# Patient Record
Sex: Male | Born: 1950
Health system: Southern US, Community
[De-identification: ages and names within clinical notes are randomized; demographics above are authoritative.]

## PROBLEM LIST (undated history)

## (undated) DIAGNOSIS — I480 Paroxysmal atrial fibrillation: Secondary | ICD-10-CM

## (undated) DIAGNOSIS — J439 Emphysema, unspecified: Secondary | ICD-10-CM

## (undated) DIAGNOSIS — J449 Chronic obstructive pulmonary disease, unspecified: Secondary | ICD-10-CM

## (undated) DIAGNOSIS — N189 Chronic kidney disease, unspecified: Secondary | ICD-10-CM

## (undated) DIAGNOSIS — F32A Depression, unspecified: Secondary | ICD-10-CM

## (undated) DIAGNOSIS — F419 Anxiety disorder, unspecified: Secondary | ICD-10-CM

## (undated) DIAGNOSIS — M1990 Unspecified osteoarthritis, unspecified site: Secondary | ICD-10-CM

## (undated) DIAGNOSIS — K219 Gastro-esophageal reflux disease without esophagitis: Secondary | ICD-10-CM

## (undated) DIAGNOSIS — I499 Cardiac arrhythmia, unspecified: Secondary | ICD-10-CM

## (undated) DIAGNOSIS — F329 Major depressive disorder, single episode, unspecified: Secondary | ICD-10-CM

## (undated) DIAGNOSIS — R0602 Shortness of breath: Secondary | ICD-10-CM

## (undated) DIAGNOSIS — I10 Essential (primary) hypertension: Secondary | ICD-10-CM

## (undated) HISTORY — DX: Chronic kidney disease, unspecified: N18.9

## (undated) HISTORY — DX: Paroxysmal atrial fibrillation: I48.0

## (undated) HISTORY — PX: APPENDECTOMY: SHX54

## (undated) HISTORY — DX: Emphysema, unspecified: J43.9

## (undated) HISTORY — PX: EYE SURGERY: SHX253

---

## 2002-10-30 ENCOUNTER — Encounter: Payer: Self-pay | Admitting: Family Medicine

## 2002-10-30 ENCOUNTER — Ambulatory Visit (HOSPITAL_COMMUNITY): Admission: RE | Admit: 2002-10-30 | Discharge: 2002-10-30 | Payer: Self-pay | Admitting: Family Medicine

## 2002-11-14 ENCOUNTER — Ambulatory Visit (HOSPITAL_BASED_OUTPATIENT_CLINIC_OR_DEPARTMENT_OTHER): Admission: RE | Admit: 2002-11-14 | Discharge: 2002-11-14 | Payer: Self-pay | Admitting: Family Medicine

## 2011-10-05 ENCOUNTER — Other Ambulatory Visit: Payer: Self-pay | Admitting: Gastroenterology

## 2011-10-05 DIAGNOSIS — R140 Abdominal distension (gaseous): Secondary | ICD-10-CM

## 2011-10-06 ENCOUNTER — Ambulatory Visit
Admission: RE | Admit: 2011-10-06 | Discharge: 2011-10-06 | Disposition: A | Discharge status: Home / Self Care | Payer: PRIVATE HEALTH INSURANCE | Source: Ambulatory Visit | Attending: Gastroenterology | Admitting: Gastroenterology

## 2011-10-06 DIAGNOSIS — R140 Abdominal distension (gaseous): Secondary | ICD-10-CM

## 2011-10-06 MED ORDER — IOHEXOL 300 MG/ML  SOLN
125.0000 mL | Freq: Once | INTRAMUSCULAR | Status: AC | PRN
  Administered 2011-10-06: 125 mL via INTRAVENOUS

## 2011-10-21 ENCOUNTER — Ambulatory Visit (HOSPITAL_COMMUNITY): Payer: PRIVATE HEALTH INSURANCE | Admitting: Anesthesiology

## 2011-10-21 ENCOUNTER — Encounter (HOSPITAL_COMMUNITY)
Admission: RE | Disposition: A | Discharge status: Home / Self Care | Payer: Self-pay | Source: Ambulatory Visit | Attending: Gastroenterology

## 2011-10-21 ENCOUNTER — Encounter (HOSPITAL_COMMUNITY): Payer: Self-pay | Admitting: *Deleted

## 2011-10-21 ENCOUNTER — Encounter (HOSPITAL_COMMUNITY): Payer: Self-pay | Admitting: Anesthesiology

## 2011-10-21 ENCOUNTER — Ambulatory Visit (HOSPITAL_COMMUNITY)
Admission: RE | Admit: 2011-10-21 | Discharge: 2011-10-21 | Disposition: A | Discharge status: Home / Self Care | Payer: PRIVATE HEALTH INSURANCE | Source: Ambulatory Visit | Attending: Gastroenterology | Admitting: Gastroenterology

## 2011-10-21 DIAGNOSIS — I10 Essential (primary) hypertension: Secondary | ICD-10-CM | POA: Insufficient documentation

## 2011-10-21 DIAGNOSIS — R112 Nausea with vomiting, unspecified: Secondary | ICD-10-CM | POA: Insufficient documentation

## 2011-10-21 DIAGNOSIS — R1033 Periumbilical pain: Secondary | ICD-10-CM | POA: Insufficient documentation

## 2011-10-21 DIAGNOSIS — R109 Unspecified abdominal pain: Secondary | ICD-10-CM | POA: Insufficient documentation

## 2011-10-21 DIAGNOSIS — K219 Gastro-esophageal reflux disease without esophagitis: Secondary | ICD-10-CM | POA: Insufficient documentation

## 2011-10-21 DIAGNOSIS — K449 Diaphragmatic hernia without obstruction or gangrene: Secondary | ICD-10-CM | POA: Insufficient documentation

## 2011-10-21 DIAGNOSIS — J4489 Other specified chronic obstructive pulmonary disease: Secondary | ICD-10-CM | POA: Insufficient documentation

## 2011-10-21 DIAGNOSIS — J449 Chronic obstructive pulmonary disease, unspecified: Secondary | ICD-10-CM | POA: Insufficient documentation

## 2011-10-21 DIAGNOSIS — D126 Benign neoplasm of colon, unspecified: Secondary | ICD-10-CM | POA: Insufficient documentation

## 2011-10-21 HISTORY — DX: Gastro-esophageal reflux disease without esophagitis: K21.9

## 2011-10-21 HISTORY — DX: Depression, unspecified: F32.A

## 2011-10-21 HISTORY — DX: Unspecified osteoarthritis, unspecified site: M19.90

## 2011-10-21 HISTORY — DX: Anxiety disorder, unspecified: F41.9

## 2011-10-21 HISTORY — DX: Major depressive disorder, single episode, unspecified: F32.9

## 2011-10-21 HISTORY — DX: Essential (primary) hypertension: I10

## 2011-10-21 HISTORY — DX: Shortness of breath: R06.02

## 2011-10-21 HISTORY — DX: Chronic obstructive pulmonary disease, unspecified: J44.9

## 2011-10-21 SURGERY — ESOPHAGOGASTRODUODENOSCOPY (EGD) WITH PROPOFOL
Anesthesia: Monitor Anesthesia Care

## 2011-10-21 MED ORDER — MEPERIDINE HCL 100 MG/ML IJ SOLN: 6.2500 mg | INTRAMUSCULAR | Status: DC | PRN

## 2011-10-21 MED ORDER — MIDAZOLAM HCL 5 MG/5ML IJ SOLN
INTRAMUSCULAR | Status: DC | PRN
  Administered 2011-10-21: 2 mg via INTRAVENOUS

## 2011-10-21 MED ORDER — PROMETHAZINE HCL 25 MG/ML IJ SOLN: 6.2500 mg | INTRAMUSCULAR | Status: DC | PRN

## 2011-10-21 MED ORDER — PROPOFOL 10 MG/ML IV EMUL
INTRAVENOUS | Status: DC | PRN
  Administered 2011-10-21: 70 ug/kg/min via INTRAVENOUS

## 2011-10-21 MED ORDER — SODIUM CHLORIDE 0.9 % IV SOLN: INTRAVENOUS | Status: DC

## 2011-10-21 MED ORDER — KETAMINE HCL 50 MG/ML IJ SOLN
INTRAMUSCULAR | Status: DC | PRN
  Administered 2011-10-21: 50 mg via INTRAMUSCULAR

## 2011-10-21 MED ORDER — LACTATED RINGERS IV SOLN
INTRAVENOUS | Status: DC
  Administered 2011-10-21: 11:00:00 via INTRAVENOUS

## 2011-10-21 MED ORDER — BUTAMBEN-TETRACAINE-BENZOCAINE 2-2-14 % EX AERO
INHALATION_SPRAY | CUTANEOUS | Status: DC | PRN
  Administered 2011-10-21: 2 via TOPICAL

## 2011-10-21 MED ORDER — FENTANYL CITRATE 0.05 MG/ML IJ SOLN
INTRAMUSCULAR | Status: DC | PRN
  Administered 2011-10-21: 100 ug via INTRAVENOUS

## 2011-10-21 SURGICAL SUPPLY — 25 items

## 2011-10-21 NOTE — Anesthesia Postprocedure Evaluation (Signed)
  Anesthesia Post-op Note  [name]  Procedure(s) Performed: Procedure(s) (LRB): ESOPHAGOGASTRODUODENOSCOPY (EGD) WITH PROPOFOL (N/A) COLONOSCOPY WITH PROPOFOL (N/A)  Patient Location: PACU  Anesthesia Type: MAC  Level of Consciousness: awake and alert   Airway and Oxygen Therapy: Patient Spontanous Breathing  Post-op Pain: mild  Post-op Assessment: Post-op Vital signs reviewed, Patient's Cardiovascular Status Stable, Respiratory Function Stable, Patent Airway and No signs of Nausea or vomiting  Post-op Vital Signs: stable  Complications: No apparent anesthesia complications

## 2011-10-21 NOTE — Op Note (Signed)
[facility] [address] [place], [address]   ENDOSCOPY PROCEDURE REPORT  PATIENT: Juanye, Ziobro  MR#: [id number] BIRTHDATE: 16-Mar-1950 , 61  yrs. old GENDER: Male ENDOSCOPIST: Willis Modena, MD REFERRED BY: PROCEDURE DATE:  10/21/2011 PROCEDURE:  EGD, diagnostic ASA CLASS:     Class III INDICATIONS:  periumbilical abdominal pain.   nausea and vomiting. MEDICATIONS: MAC sedation, administered by CRNA TOPICAL ANESTHETIC: Cetacaine Spray  DESCRIPTION OF PROCEDURE: After the risks benefits and alternatives of the procedure were thoroughly explained, informed consent was obtained.  The Pentax Gastroscope [id number] endoscope was introduced through the mouth and advanced to the second portion of the duodenum. Without limitations.  The instrument was slowly withdrawn as the mucosa was fully examined.    Findings:  Moderate hiatal hernia; otherwise normal esophagus. Normal stomach, pylorus and duodenum to the second portion. Retroflexion into cardia showed hiatal hernia, otherwise normal.          The scope was then withdrawn from the patient and the procedure completed.  ENDOSCOPIC IMPRESSION:     As above.  Hiatal hernia could predispose to GERD which in turn could cause some nausea and vomiting symptoms.  RECOMMENDATIONS:     1.  Watch for potential complications of procedure. 2.  Continue Nexium 40 mg bid. 3.  Consider abdominal ultrasound to better exclude gallbladder source of symptoms. 4.  Proceed with colonoscopy today.  eSigned:  Willis Modena, MD 10/21/2011 12:18 PM   CC:

## 2011-10-21 NOTE — Transfer of Care (Signed)
Immediate Anesthesia Transfer of Care Note  [name] [facility]  Procedure(s) Performed: Procedure(s) (LRB) with comments: ESOPHAGOGASTRODUODENOSCOPY (EGD) WITH PROPOFOL (N/A) COLONOSCOPY WITH PROPOFOL (N/A)  Patient Location: PACU  Anesthesia Type: MAC  Level of Consciousness: sedated  Airway & Oxygen Therapy: Patient Spontanous Breathing and Patient connected to nasal cannula oxygen  Post-op Assessment: Report given to PACU RN and Post -op Vital signs reviewed and stable  Post vital signs: Reviewed and stable  Complications: No apparent anesthesia complications

## 2011-10-21 NOTE — H&P (Signed)
Patient interval history reviewed.  Patient examined again.  There has been no change from documented H/P dated 10/05/11 (scanned into chart from our office) except as documented above.  Assessment:  1.  Generalized abdominal pain. 2.  Nausea and vomiting.  Plan:  1.  Upper endoscopy and colonoscopy. 2.  Risks (bleeding, infection, bowel perforation that could require surgery, sedation-related changes in cardiopulmonary systems), benefits (identification and possible treatment of source of symptoms, exclusion of certain causes of symptoms), and alternatives (watchful waiting, radiographic imaging studies, empiric medical treatment) of upper endoscopy (EGD) were explained to patient in detail and patient wishes to proceed. 3.  Risks (bleeding, infection, bowel perforation that could require surgery, sedation-related changes in cardiopulmonary systems), benefits (identification and possible treatment of source of symptoms, exclusion of certain causes of symptoms), and alternatives (watchful waiting, radiographic imaging studies, empiric medical treatment) of colonoscopy were explained to patient in detail and patient wishes to proceed.

## 2011-10-21 NOTE — Op Note (Signed)
[facility] [address] [place], [address]   COLONOSCOPY PROCEDURE REPORT  PATIENT: Jay, [name]  MR#: [id number] BIRTHDATE: Jan 26, 1950 , 61  yrs. old GENDER: Male ENDOSCOPIST: Willis Modena, MD REFERRED BY: Elvera Lennox, PA-C PROCEDURE DATE:  10/21/2011 PROCEDURE:   Colonoscopy with snare polypectomy ASA CLASS:   Class III INDICATIONS:abdominal pain and average risk patient for colon cancer. MEDICATIONS: MAC sedation, administered by CRNA  DESCRIPTION OF PROCEDURE:   After the risks benefits and alternatives of the procedure were thoroughly explained, informed consent was obtained.  A digital rectal exam revealed no abnormalities of the rectum.   The Pentax Ped Colon [id number] endoscope was introduced through the anus and advanced to the cecum, which was identified by both the appendix and ileocecal valve. No adverse events experienced.   The quality of the prep was good.  The instrument was then slowly withdrawn as the colon was fully examined.    Findings:  Digital rectal exam normal.  Prep quality was good. Multiple left-sided diverticula seen.  Three sub-cm sigmoid polyps removed wtih snare cautyer.  Stool plug seen in residual appendiceal stump.  No other polyps, masses, vascular ectasias, or inflammatory changes were seen.       Retroflexed view of rectum was normal.       Withdrawal time was over 10 minutes     .  The scope was withdrawn and the procedure completed.  ENDOSCOPIC IMPRESSION:     As above.  No cause of abdominal pain identified.  RECOMMENDATIONS:     1.  Watch for potential complications of procedure. 2.  Await polypectomy results, which will determine interval for next surveillance colonoscopy. 3.  Follow-up with [facility] in 4-6 weeks.  eSigned:  Willis Modena, MD 10/21/2011 12:23 PM   cc:

## 2011-10-21 NOTE — Anesthesia Preprocedure Evaluation (Signed)
Anesthesia Evaluation  Patient identified by MRN, date of birth, ID band Patient awake    Reviewed: Allergy & Precautions, H&P , NPO status , Patient's Chart, lab work & pertinent test results  Airway Mallampati: I TM Distance: >3 FB Neck ROM: Full    Dental No notable dental hx. (+) Edentulous Upper and Edentulous Lower   Pulmonary neg pulmonary ROS, shortness of breath, COPD COPD inhaler, former smoker,  breath sounds clear to auscultation  Pulmonary exam normal       Cardiovascular hypertension, Pt. on medications negative cardio ROS  Rhythm:Regular Rate:Normal     Neuro/Psych PSYCHIATRIC DISORDERS Anxiety Depression negative neurological ROS  negative psych ROS   GI/Hepatic negative GI ROS, Neg liver ROS, GERD-  ,  Endo/Other  negative endocrine ROS  Renal/GU negative Renal ROS  negative genitourinary   Musculoskeletal negative musculoskeletal ROS (+)   Abdominal   Peds negative pediatric ROS (+)  Hematology negative hematology ROS (+)   Anesthesia Other Findings   Reproductive/Obstetrics negative OB ROS                           Anesthesia Physical Anesthesia Plan  ASA: III  Anesthesia Plan: MAC   Post-op Pain Management:    Induction:   Airway Management Planned: Nasal Cannula and Simple Face Mask  Additional Equipment:   Intra-op Plan:   Post-operative Plan:   Informed Consent: I have reviewed the patients History and Physical, chart, labs and discussed the procedure including the risks, benefits and alternatives for the proposed anesthesia with the patient or authorized representative who has indicated his/her understanding and acceptance.   Dental advisory given  Plan Discussed with: CRNA  Anesthesia Plan Comments:         Anesthesia Quick Evaluation

## 2012-04-13 ENCOUNTER — Institutional Professional Consult (permissible substitution): Payer: PRIVATE HEALTH INSURANCE | Admitting: Internal Medicine

## 2014-01-15 DIAGNOSIS — R5383 Other fatigue: Secondary | ICD-10-CM | POA: Diagnosis not present

## 2014-01-15 DIAGNOSIS — E785 Hyperlipidemia, unspecified: Secondary | ICD-10-CM | POA: Diagnosis not present

## 2014-01-15 DIAGNOSIS — F329 Major depressive disorder, single episode, unspecified: Secondary | ICD-10-CM | POA: Diagnosis not present

## 2014-01-15 DIAGNOSIS — I10 Essential (primary) hypertension: Secondary | ICD-10-CM | POA: Diagnosis not present

## 2014-01-15 DIAGNOSIS — K219 Gastro-esophageal reflux disease without esophagitis: Secondary | ICD-10-CM | POA: Diagnosis not present

## 2014-01-15 DIAGNOSIS — Z23 Encounter for immunization: Secondary | ICD-10-CM | POA: Diagnosis not present

## 2014-01-15 DIAGNOSIS — F419 Anxiety disorder, unspecified: Secondary | ICD-10-CM | POA: Diagnosis not present

## 2014-01-15 DIAGNOSIS — J449 Chronic obstructive pulmonary disease, unspecified: Secondary | ICD-10-CM | POA: Diagnosis not present

## 2014-02-15 DIAGNOSIS — E785 Hyperlipidemia, unspecified: Secondary | ICD-10-CM | POA: Diagnosis not present

## 2014-07-23 DIAGNOSIS — F419 Anxiety disorder, unspecified: Secondary | ICD-10-CM | POA: Diagnosis not present

## 2014-07-23 DIAGNOSIS — I10 Essential (primary) hypertension: Secondary | ICD-10-CM | POA: Diagnosis not present

## 2014-07-23 DIAGNOSIS — J449 Chronic obstructive pulmonary disease, unspecified: Secondary | ICD-10-CM | POA: Diagnosis not present

## 2014-07-23 DIAGNOSIS — K219 Gastro-esophageal reflux disease without esophagitis: Secondary | ICD-10-CM | POA: Diagnosis not present

## 2014-07-23 DIAGNOSIS — E785 Hyperlipidemia, unspecified: Secondary | ICD-10-CM | POA: Diagnosis not present

## 2014-07-23 DIAGNOSIS — F329 Major depressive disorder, single episode, unspecified: Secondary | ICD-10-CM | POA: Diagnosis not present

## 2015-02-26 DIAGNOSIS — M5136 Other intervertebral disc degeneration, lumbar region: Secondary | ICD-10-CM | POA: Diagnosis not present

## 2015-02-26 DIAGNOSIS — M5090 Cervical disc disorder, unspecified, unspecified cervical region: Secondary | ICD-10-CM | POA: Diagnosis not present

## 2015-03-18 DIAGNOSIS — F39 Unspecified mood [affective] disorder: Secondary | ICD-10-CM | POA: Diagnosis not present

## 2015-03-18 DIAGNOSIS — Z23 Encounter for immunization: Secondary | ICD-10-CM | POA: Diagnosis not present

## 2015-03-18 DIAGNOSIS — E7800 Pure hypercholesterolemia, unspecified: Secondary | ICD-10-CM | POA: Diagnosis not present

## 2015-03-18 DIAGNOSIS — F324 Major depressive disorder, single episode, in partial remission: Secondary | ICD-10-CM | POA: Diagnosis not present

## 2015-03-18 DIAGNOSIS — Z0000 Encounter for general adult medical examination without abnormal findings: Secondary | ICD-10-CM | POA: Diagnosis not present

## 2015-03-18 DIAGNOSIS — F419 Anxiety disorder, unspecified: Secondary | ICD-10-CM | POA: Diagnosis not present

## 2015-03-18 DIAGNOSIS — K219 Gastro-esophageal reflux disease without esophagitis: Secondary | ICD-10-CM | POA: Diagnosis not present

## 2015-03-18 DIAGNOSIS — I10 Essential (primary) hypertension: Secondary | ICD-10-CM | POA: Diagnosis not present

## 2015-03-18 DIAGNOSIS — J449 Chronic obstructive pulmonary disease, unspecified: Secondary | ICD-10-CM | POA: Diagnosis not present

## 2015-03-18 DIAGNOSIS — R5383 Other fatigue: Secondary | ICD-10-CM | POA: Diagnosis not present

## 2015-03-27 DIAGNOSIS — G4710 Hypersomnia, unspecified: Secondary | ICD-10-CM | POA: Diagnosis not present

## 2015-04-11 DIAGNOSIS — H5210 Myopia, unspecified eye: Secondary | ICD-10-CM | POA: Diagnosis not present

## 2015-04-11 DIAGNOSIS — Z0100 Encounter for examination of eyes and vision without abnormal findings: Secondary | ICD-10-CM | POA: Diagnosis not present

## 2015-04-29 DIAGNOSIS — E7800 Pure hypercholesterolemia, unspecified: Secondary | ICD-10-CM | POA: Diagnosis not present

## 2015-04-29 DIAGNOSIS — F3181 Bipolar II disorder: Secondary | ICD-10-CM | POA: Diagnosis not present

## 2015-06-11 ENCOUNTER — Other Ambulatory Visit: Payer: Self-pay | Admitting: Family Medicine

## 2015-06-11 ENCOUNTER — Ambulatory Visit
Admission: RE | Admit: 2015-06-11 | Discharge: 2015-06-11 | Disposition: A | Discharge status: Home / Self Care | Payer: Commercial Managed Care - HMO | Source: Ambulatory Visit | Attending: Family Medicine | Admitting: Family Medicine

## 2015-06-11 DIAGNOSIS — M545 Low back pain: Secondary | ICD-10-CM

## 2015-06-11 DIAGNOSIS — F3181 Bipolar II disorder: Secondary | ICD-10-CM | POA: Diagnosis not present

## 2015-06-11 DIAGNOSIS — M542 Cervicalgia: Secondary | ICD-10-CM

## 2015-06-11 DIAGNOSIS — M47812 Spondylosis without myelopathy or radiculopathy, cervical region: Secondary | ICD-10-CM | POA: Diagnosis not present

## 2015-06-11 DIAGNOSIS — J309 Allergic rhinitis, unspecified: Secondary | ICD-10-CM | POA: Diagnosis not present

## 2015-06-11 DIAGNOSIS — F329 Major depressive disorder, single episode, unspecified: Secondary | ICD-10-CM | POA: Diagnosis not present

## 2015-07-23 DIAGNOSIS — F3181 Bipolar II disorder: Secondary | ICD-10-CM | POA: Diagnosis not present

## 2015-07-23 DIAGNOSIS — M545 Low back pain: Secondary | ICD-10-CM | POA: Diagnosis not present

## 2015-07-24 ENCOUNTER — Other Ambulatory Visit: Payer: Self-pay | Admitting: Family Medicine

## 2015-07-24 DIAGNOSIS — M545 Low back pain: Secondary | ICD-10-CM

## 2015-07-25 ENCOUNTER — Other Ambulatory Visit: Payer: Self-pay | Admitting: Family Medicine

## 2015-07-25 DIAGNOSIS — M545 Low back pain: Secondary | ICD-10-CM

## 2015-08-05 ENCOUNTER — Ambulatory Visit
Admission: RE | Admit: 2015-08-05 | Discharge: 2015-08-05 | Disposition: A | Discharge status: Home / Self Care | Payer: Commercial Managed Care - HMO | Source: Ambulatory Visit | Attending: Family Medicine | Admitting: Family Medicine

## 2015-08-05 DIAGNOSIS — M545 Low back pain: Secondary | ICD-10-CM

## 2015-08-05 DIAGNOSIS — Z01818 Encounter for other preprocedural examination: Secondary | ICD-10-CM | POA: Diagnosis not present

## 2015-10-23 DIAGNOSIS — M545 Low back pain: Secondary | ICD-10-CM | POA: Diagnosis not present

## 2015-10-23 DIAGNOSIS — Z23 Encounter for immunization: Secondary | ICD-10-CM | POA: Diagnosis not present

## 2015-10-23 DIAGNOSIS — F3181 Bipolar II disorder: Secondary | ICD-10-CM | POA: Diagnosis not present

## 2015-10-23 DIAGNOSIS — R413 Other amnesia: Secondary | ICD-10-CM | POA: Diagnosis not present

## 2015-10-30 DIAGNOSIS — I10 Essential (primary) hypertension: Secondary | ICD-10-CM | POA: Diagnosis not present

## 2015-10-30 DIAGNOSIS — M542 Cervicalgia: Secondary | ICD-10-CM | POA: Diagnosis not present

## 2015-10-30 DIAGNOSIS — M545 Low back pain: Secondary | ICD-10-CM | POA: Diagnosis not present

## 2015-10-30 DIAGNOSIS — M62559 Muscle wasting and atrophy, not elsewhere classified, unspecified thigh: Secondary | ICD-10-CM | POA: Diagnosis not present

## 2015-11-12 DIAGNOSIS — M542 Cervicalgia: Secondary | ICD-10-CM | POA: Diagnosis not present

## 2015-11-12 DIAGNOSIS — M62559 Muscle wasting and atrophy, not elsewhere classified, unspecified thigh: Secondary | ICD-10-CM | POA: Diagnosis not present

## 2015-11-12 DIAGNOSIS — M545 Low back pain: Secondary | ICD-10-CM | POA: Diagnosis not present

## 2015-11-12 DIAGNOSIS — I10 Essential (primary) hypertension: Secondary | ICD-10-CM | POA: Diagnosis not present

## 2015-11-15 DIAGNOSIS — M542 Cervicalgia: Secondary | ICD-10-CM | POA: Diagnosis not present

## 2015-11-15 DIAGNOSIS — M62559 Muscle wasting and atrophy, not elsewhere classified, unspecified thigh: Secondary | ICD-10-CM | POA: Diagnosis not present

## 2015-11-15 DIAGNOSIS — M545 Low back pain: Secondary | ICD-10-CM | POA: Diagnosis not present

## 2015-11-15 DIAGNOSIS — I10 Essential (primary) hypertension: Secondary | ICD-10-CM | POA: Diagnosis not present

## 2015-11-18 DIAGNOSIS — I10 Essential (primary) hypertension: Secondary | ICD-10-CM | POA: Diagnosis not present

## 2015-11-18 DIAGNOSIS — M545 Low back pain: Secondary | ICD-10-CM | POA: Diagnosis not present

## 2015-11-18 DIAGNOSIS — M62559 Muscle wasting and atrophy, not elsewhere classified, unspecified thigh: Secondary | ICD-10-CM | POA: Diagnosis not present

## 2015-11-18 DIAGNOSIS — M542 Cervicalgia: Secondary | ICD-10-CM | POA: Diagnosis not present

## 2015-11-20 DIAGNOSIS — I10 Essential (primary) hypertension: Secondary | ICD-10-CM | POA: Diagnosis not present

## 2015-11-20 DIAGNOSIS — M62559 Muscle wasting and atrophy, not elsewhere classified, unspecified thigh: Secondary | ICD-10-CM | POA: Diagnosis not present

## 2015-11-20 DIAGNOSIS — M542 Cervicalgia: Secondary | ICD-10-CM | POA: Diagnosis not present

## 2015-11-20 DIAGNOSIS — M545 Low back pain: Secondary | ICD-10-CM | POA: Diagnosis not present

## 2016-01-22 DIAGNOSIS — F3181 Bipolar II disorder: Secondary | ICD-10-CM | POA: Diagnosis not present

## 2016-01-22 DIAGNOSIS — R413 Other amnesia: Secondary | ICD-10-CM | POA: Diagnosis not present

## 2016-01-24 ENCOUNTER — Other Ambulatory Visit: Payer: Self-pay | Admitting: Family Medicine

## 2016-01-24 DIAGNOSIS — F3181 Bipolar II disorder: Secondary | ICD-10-CM

## 2016-01-27 ENCOUNTER — Other Ambulatory Visit: Payer: Self-pay | Admitting: Family Medicine

## 2016-01-27 DIAGNOSIS — R413 Other amnesia: Secondary | ICD-10-CM

## 2016-01-27 DIAGNOSIS — F3181 Bipolar II disorder: Secondary | ICD-10-CM

## 2016-01-27 DIAGNOSIS — R454 Irritability and anger: Secondary | ICD-10-CM

## 2016-02-01 ENCOUNTER — Other Ambulatory Visit: Payer: Commercial Managed Care - HMO

## 2016-02-03 ENCOUNTER — Other Ambulatory Visit: Payer: Self-pay | Admitting: Family Medicine

## 2016-02-03 DIAGNOSIS — R454 Irritability and anger: Secondary | ICD-10-CM

## 2016-02-03 DIAGNOSIS — F3181 Bipolar II disorder: Secondary | ICD-10-CM

## 2016-02-06 ENCOUNTER — Ambulatory Visit
Admission: RE | Admit: 2016-02-06 | Discharge: 2016-02-06 | Disposition: A | Discharge status: Home / Self Care | Payer: Commercial Managed Care - HMO | Source: Ambulatory Visit | Attending: Family Medicine | Admitting: Family Medicine

## 2016-02-06 DIAGNOSIS — R930 Abnormal findings on diagnostic imaging of skull and head, not elsewhere classified: Secondary | ICD-10-CM | POA: Diagnosis not present

## 2016-02-06 DIAGNOSIS — F3181 Bipolar II disorder: Secondary | ICD-10-CM

## 2016-02-06 DIAGNOSIS — R454 Irritability and anger: Secondary | ICD-10-CM

## 2016-02-06 MED ORDER — IOPAMIDOL (ISOVUE-300) INJECTION 61%
75.0000 mL | Freq: Once | INTRAVENOUS | Status: AC | PRN
  Administered 2016-02-06: 75 mL via INTRAVENOUS

## 2016-02-20 ENCOUNTER — Other Ambulatory Visit (HOSPITAL_COMMUNITY): Payer: Self-pay | Admitting: Family Medicine

## 2016-02-20 DIAGNOSIS — G319 Degenerative disease of nervous system, unspecified: Secondary | ICD-10-CM

## 2016-02-20 DIAGNOSIS — R413 Other amnesia: Secondary | ICD-10-CM

## 2016-02-20 DIAGNOSIS — R454 Irritability and anger: Secondary | ICD-10-CM

## 2016-03-02 ENCOUNTER — Ambulatory Visit (HOSPITAL_COMMUNITY): Payer: Commercial Managed Care - HMO

## 2016-03-02 ENCOUNTER — Encounter (HOSPITAL_COMMUNITY): Payer: Self-pay

## 2016-03-11 DIAGNOSIS — F3181 Bipolar II disorder: Secondary | ICD-10-CM | POA: Diagnosis not present

## 2016-04-30 DIAGNOSIS — H5440 Blindness, one eye, unspecified eye: Secondary | ICD-10-CM | POA: Diagnosis not present

## 2016-04-30 DIAGNOSIS — J449 Chronic obstructive pulmonary disease, unspecified: Secondary | ICD-10-CM | POA: Diagnosis not present

## 2016-04-30 DIAGNOSIS — E7800 Pure hypercholesterolemia, unspecified: Secondary | ICD-10-CM | POA: Diagnosis not present

## 2016-04-30 DIAGNOSIS — Z0000 Encounter for general adult medical examination without abnormal findings: Secondary | ICD-10-CM | POA: Diagnosis not present

## 2016-04-30 DIAGNOSIS — D692 Other nonthrombocytopenic purpura: Secondary | ICD-10-CM | POA: Diagnosis not present

## 2016-04-30 DIAGNOSIS — I10 Essential (primary) hypertension: Secondary | ICD-10-CM | POA: Diagnosis not present

## 2016-04-30 DIAGNOSIS — K219 Gastro-esophageal reflux disease without esophagitis: Secondary | ICD-10-CM | POA: Diagnosis not present

## 2016-04-30 DIAGNOSIS — Z125 Encounter for screening for malignant neoplasm of prostate: Secondary | ICD-10-CM | POA: Diagnosis not present

## 2016-04-30 DIAGNOSIS — Z1159 Encounter for screening for other viral diseases: Secondary | ICD-10-CM | POA: Diagnosis not present

## 2016-04-30 DIAGNOSIS — M545 Low back pain: Secondary | ICD-10-CM | POA: Diagnosis not present

## 2016-06-12 DIAGNOSIS — J449 Chronic obstructive pulmonary disease, unspecified: Secondary | ICD-10-CM | POA: Diagnosis not present

## 2016-06-12 DIAGNOSIS — J209 Acute bronchitis, unspecified: Secondary | ICD-10-CM | POA: Diagnosis not present

## 2016-07-27 DIAGNOSIS — M79651 Pain in right thigh: Secondary | ICD-10-CM | POA: Diagnosis not present

## 2016-08-11 ENCOUNTER — Ambulatory Visit
Admission: RE | Admit: 2016-08-11 | Discharge: 2016-08-11 | Disposition: A | Discharge status: Home / Self Care | Payer: Commercial Managed Care - HMO | Source: Ambulatory Visit | Attending: Physician Assistant | Admitting: Physician Assistant

## 2016-08-11 ENCOUNTER — Other Ambulatory Visit: Payer: Self-pay | Admitting: Physician Assistant

## 2016-08-11 DIAGNOSIS — M25551 Pain in right hip: Secondary | ICD-10-CM

## 2016-08-14 DIAGNOSIS — M9905 Segmental and somatic dysfunction of pelvic region: Secondary | ICD-10-CM | POA: Diagnosis not present

## 2016-08-14 DIAGNOSIS — M25551 Pain in right hip: Secondary | ICD-10-CM | POA: Diagnosis not present

## 2016-08-14 DIAGNOSIS — M9903 Segmental and somatic dysfunction of lumbar region: Secondary | ICD-10-CM | POA: Diagnosis not present

## 2016-08-14 DIAGNOSIS — M5441 Lumbago with sciatica, right side: Secondary | ICD-10-CM | POA: Diagnosis not present

## 2016-08-17 DIAGNOSIS — M9903 Segmental and somatic dysfunction of lumbar region: Secondary | ICD-10-CM | POA: Diagnosis not present

## 2016-08-17 DIAGNOSIS — M25551 Pain in right hip: Secondary | ICD-10-CM | POA: Diagnosis not present

## 2016-08-17 DIAGNOSIS — M5441 Lumbago with sciatica, right side: Secondary | ICD-10-CM | POA: Diagnosis not present

## 2016-08-17 DIAGNOSIS — M9905 Segmental and somatic dysfunction of pelvic region: Secondary | ICD-10-CM | POA: Diagnosis not present

## 2016-08-19 DIAGNOSIS — M9905 Segmental and somatic dysfunction of pelvic region: Secondary | ICD-10-CM | POA: Diagnosis not present

## 2016-08-19 DIAGNOSIS — M5441 Lumbago with sciatica, right side: Secondary | ICD-10-CM | POA: Diagnosis not present

## 2016-08-19 DIAGNOSIS — M9903 Segmental and somatic dysfunction of lumbar region: Secondary | ICD-10-CM | POA: Diagnosis not present

## 2016-08-19 DIAGNOSIS — M25551 Pain in right hip: Secondary | ICD-10-CM | POA: Diagnosis not present

## 2016-08-21 DIAGNOSIS — M25551 Pain in right hip: Secondary | ICD-10-CM | POA: Diagnosis not present

## 2016-08-21 DIAGNOSIS — M9903 Segmental and somatic dysfunction of lumbar region: Secondary | ICD-10-CM | POA: Diagnosis not present

## 2016-08-21 DIAGNOSIS — M5441 Lumbago with sciatica, right side: Secondary | ICD-10-CM | POA: Diagnosis not present

## 2016-08-21 DIAGNOSIS — M9905 Segmental and somatic dysfunction of pelvic region: Secondary | ICD-10-CM | POA: Diagnosis not present

## 2016-08-24 DIAGNOSIS — M9905 Segmental and somatic dysfunction of pelvic region: Secondary | ICD-10-CM | POA: Diagnosis not present

## 2016-08-24 DIAGNOSIS — M25551 Pain in right hip: Secondary | ICD-10-CM | POA: Diagnosis not present

## 2016-08-24 DIAGNOSIS — M9903 Segmental and somatic dysfunction of lumbar region: Secondary | ICD-10-CM | POA: Diagnosis not present

## 2016-08-24 DIAGNOSIS — M5441 Lumbago with sciatica, right side: Secondary | ICD-10-CM | POA: Diagnosis not present

## 2016-08-27 ENCOUNTER — Ambulatory Visit (INDEPENDENT_AMBULATORY_CARE_PROVIDER_SITE_OTHER): Payer: Medicare HMO

## 2016-08-27 ENCOUNTER — Ambulatory Visit (INDEPENDENT_AMBULATORY_CARE_PROVIDER_SITE_OTHER): Payer: Medicare HMO | Admitting: Orthopaedic Surgery

## 2016-08-27 ENCOUNTER — Encounter: Payer: Self-pay | Admitting: Orthopaedic Surgery

## 2016-08-27 VITALS — BP 136/87 | HR 69 | Temp 97.0°F | Ht 69.0 in | Wt 205.0 lb

## 2016-08-27 DIAGNOSIS — M5441 Lumbago with sciatica, right side: Secondary | ICD-10-CM | POA: Diagnosis not present

## 2016-08-27 MED ORDER — PREDNISONE 5 MG (21) PO TBPK: ORAL_TABLET | ORAL | 0 refills | Status: DC

## 2016-08-27 NOTE — Progress Notes (Signed)
Subjective:    Patient ID: Jay [name], male    DOB: [birth year], 66 y.o.   MRN: [id number]  HPI He stepped in a hole and hurt his lower back about a month ago.  He has been having lower back pain with right sided sciatica and pain radiating to the lower right calf since then.  He has taken Aleve, Advil.  He is also on Relafen 500.  He has used heat, ice.  He is seeing a Restaurant manager, fast food but he still is hurting.  He has no new trauma, no bowel or bladder problem and no weakness.  He is concerned his pain has not improved.    Review of Systems  HENT: Negative for congestion.   Respiratory: Positive for shortness of breath. Negative for cough.   Cardiovascular: Negative for chest pain and leg swelling.  Endocrine: Positive for cold intolerance.  Musculoskeletal: Positive for arthralgias and back pain.  Allergic/Immunologic: Positive for environmental allergies.  Psychiatric/Behavioral: The patient is nervous/anxious.    Past Medical History:  Diagnosis Date  . Anxiety   . Arthritis   . COPD (chronic obstructive pulmonary disease) ([place])   . Depression   . GERD (gastroesophageal reflux disease)   . Hypertension   . Shortness of breath    with excertion    Past Surgical History:  Procedure Laterality Date  . APPENDECTOMY    . EYE SURGERY      Current Outpatient Prescriptions on File Prior to Visit  Medication Sig Dispense Refill  . aspirin 81 MG tablet Take 81 mg by mouth daily.    Marland Kitchen escitalopram (LEXAPRO) 20 MG tablet Take 20 mg by mouth 2 (two) times daily.    Marland Kitchen esomeprazole (NEXIUM) 40 MG capsule Take 40 mg by mouth daily before breakfast.     No current facility-administered medications on file prior to visit.     Social History   Social History  . Marital status: Married    Spouse name: N/A  . Number of children: N/A  . Years of education: N/A   Occupational History  . Not on file.   Social History Main Topics  . Smoking status: Former Research scientist (life sciences)  . Smokeless  tobacco: Never Used  . Alcohol use No  . Drug use: No  . Sexual activity: Not on file   Other Topics Concern  . Not on file   Social History Narrative  . No narrative on file    Family History  Problem Relation Age of Onset  . Hypertension Mother   . Cancer Father   . Hypertension Father   . Hypertension Sister   . Cancer Brother   . Hypertension Brother     BP 136/87   Pulse 69   Temp (!) 97 F (36.1 C)   Ht 5\' 9"  (1.753 m)   Wt 205 lb (93 kg)   BMI 30.27 kg/m      Objective:   Physical Exam  Constitutional: He is oriented to person, place, and time. He appears well-developed and well-nourished.  HENT:  Head: Normocephalic and atraumatic.  Eyes: Pupils are equal, round, and reactive to light. Conjunctivae and EOM are normal.  Neck: Normal range of motion. Neck supple.  Cardiovascular: Normal rate, regular rhythm and intact distal pulses.   Pulmonary/Chest: Effort normal.  Abdominal: Soft.  Musculoskeletal: He exhibits tenderness (Right lower back tender but no spasm.  Lacks six inches from touching toes, motiion good, NV intact.  SLR weakly positive right at 30 degrees.  Tone and strenght normal.  Gait normal.).  Neurological: He is alert and oriented to person, place, and time. He has normal reflexes. He displays normal reflexes. No cranial nerve deficit. He exhibits normal muscle tone. Coordination normal.  Skin: Skin is warm and dry.  Psychiatric: He has a normal mood and affect. His behavior is normal. Judgment and thought content normal.  Vitals reviewed.  X-rays of the lumbar spine were done, reported separately.       Assessment & Plan:   Encounter Diagnosis  Name Primary?  . Acute right-sided low back pain with right-sided sciatica Yes   I will give prednisone dose pack, stop the Relafen.  Return in one week.  If pain continues, consider MRI.  I am concerned about a possible HNP.  Call if any problem.  Precautions  discussed.   Electronically Signed Sanjuana Kava, MD 8/16/201810:07 AM

## 2016-08-28 DIAGNOSIS — M9903 Segmental and somatic dysfunction of lumbar region: Secondary | ICD-10-CM | POA: Diagnosis not present

## 2016-08-28 DIAGNOSIS — M25551 Pain in right hip: Secondary | ICD-10-CM | POA: Diagnosis not present

## 2016-08-28 DIAGNOSIS — M5441 Lumbago with sciatica, right side: Secondary | ICD-10-CM | POA: Diagnosis not present

## 2016-08-28 DIAGNOSIS — M9905 Segmental and somatic dysfunction of pelvic region: Secondary | ICD-10-CM | POA: Diagnosis not present

## 2016-08-31 DIAGNOSIS — M9903 Segmental and somatic dysfunction of lumbar region: Secondary | ICD-10-CM | POA: Diagnosis not present

## 2016-08-31 DIAGNOSIS — M25551 Pain in right hip: Secondary | ICD-10-CM | POA: Diagnosis not present

## 2016-08-31 DIAGNOSIS — M9905 Segmental and somatic dysfunction of pelvic region: Secondary | ICD-10-CM | POA: Diagnosis not present

## 2016-08-31 DIAGNOSIS — M5441 Lumbago with sciatica, right side: Secondary | ICD-10-CM | POA: Diagnosis not present

## 2016-09-03 ENCOUNTER — Ambulatory Visit: Payer: Medicare HMO | Admitting: Orthopaedic Surgery

## 2016-09-04 DIAGNOSIS — M25551 Pain in right hip: Secondary | ICD-10-CM | POA: Diagnosis not present

## 2016-09-04 DIAGNOSIS — M9903 Segmental and somatic dysfunction of lumbar region: Secondary | ICD-10-CM | POA: Diagnosis not present

## 2016-09-04 DIAGNOSIS — M9905 Segmental and somatic dysfunction of pelvic region: Secondary | ICD-10-CM | POA: Diagnosis not present

## 2016-09-04 DIAGNOSIS — M5441 Lumbago with sciatica, right side: Secondary | ICD-10-CM | POA: Diagnosis not present

## 2016-09-11 DIAGNOSIS — M9905 Segmental and somatic dysfunction of pelvic region: Secondary | ICD-10-CM | POA: Diagnosis not present

## 2016-09-11 DIAGNOSIS — M9903 Segmental and somatic dysfunction of lumbar region: Secondary | ICD-10-CM | POA: Diagnosis not present

## 2016-09-11 DIAGNOSIS — M25551 Pain in right hip: Secondary | ICD-10-CM | POA: Diagnosis not present

## 2016-09-11 DIAGNOSIS — M5441 Lumbago with sciatica, right side: Secondary | ICD-10-CM | POA: Diagnosis not present

## 2016-09-18 DIAGNOSIS — M5441 Lumbago with sciatica, right side: Secondary | ICD-10-CM | POA: Diagnosis not present

## 2016-09-18 DIAGNOSIS — M25551 Pain in right hip: Secondary | ICD-10-CM | POA: Diagnosis not present

## 2016-09-18 DIAGNOSIS — M9903 Segmental and somatic dysfunction of lumbar region: Secondary | ICD-10-CM | POA: Diagnosis not present

## 2016-09-18 DIAGNOSIS — M9905 Segmental and somatic dysfunction of pelvic region: Secondary | ICD-10-CM | POA: Diagnosis not present

## 2016-10-16 DIAGNOSIS — M25551 Pain in right hip: Secondary | ICD-10-CM | POA: Diagnosis not present

## 2016-10-16 DIAGNOSIS — M9905 Segmental and somatic dysfunction of pelvic region: Secondary | ICD-10-CM | POA: Diagnosis not present

## 2016-10-16 DIAGNOSIS — M5441 Lumbago with sciatica, right side: Secondary | ICD-10-CM | POA: Diagnosis not present

## 2016-10-16 DIAGNOSIS — M9903 Segmental and somatic dysfunction of lumbar region: Secondary | ICD-10-CM | POA: Diagnosis not present

## 2016-10-30 DIAGNOSIS — M545 Low back pain: Secondary | ICD-10-CM | POA: Diagnosis not present

## 2016-10-30 DIAGNOSIS — E7800 Pure hypercholesterolemia, unspecified: Secondary | ICD-10-CM | POA: Diagnosis not present

## 2016-10-30 DIAGNOSIS — J449 Chronic obstructive pulmonary disease, unspecified: Secondary | ICD-10-CM | POA: Diagnosis not present

## 2016-10-30 DIAGNOSIS — Z23 Encounter for immunization: Secondary | ICD-10-CM | POA: Diagnosis not present

## 2016-10-30 DIAGNOSIS — H5440 Blindness, one eye, unspecified eye: Secondary | ICD-10-CM | POA: Diagnosis not present

## 2016-10-30 DIAGNOSIS — D692 Other nonthrombocytopenic purpura: Secondary | ICD-10-CM | POA: Diagnosis not present

## 2016-10-30 DIAGNOSIS — I10 Essential (primary) hypertension: Secondary | ICD-10-CM | POA: Diagnosis not present

## 2016-10-30 DIAGNOSIS — K219 Gastro-esophageal reflux disease without esophagitis: Secondary | ICD-10-CM | POA: Diagnosis not present

## 2016-10-30 DIAGNOSIS — F324 Major depressive disorder, single episode, in partial remission: Secondary | ICD-10-CM | POA: Diagnosis not present

## 2017-05-11 DIAGNOSIS — Z125 Encounter for screening for malignant neoplasm of prostate: Secondary | ICD-10-CM | POA: Diagnosis not present

## 2017-05-11 DIAGNOSIS — N529 Male erectile dysfunction, unspecified: Secondary | ICD-10-CM | POA: Diagnosis not present

## 2017-05-11 DIAGNOSIS — J449 Chronic obstructive pulmonary disease, unspecified: Secondary | ICD-10-CM | POA: Diagnosis not present

## 2017-05-11 DIAGNOSIS — M545 Low back pain: Secondary | ICD-10-CM | POA: Diagnosis not present

## 2017-05-11 DIAGNOSIS — Z0000 Encounter for general adult medical examination without abnormal findings: Secondary | ICD-10-CM | POA: Diagnosis not present

## 2017-05-11 DIAGNOSIS — E7800 Pure hypercholesterolemia, unspecified: Secondary | ICD-10-CM | POA: Diagnosis not present

## 2017-05-11 DIAGNOSIS — K219 Gastro-esophageal reflux disease without esophagitis: Secondary | ICD-10-CM | POA: Diagnosis not present

## 2017-05-11 DIAGNOSIS — I10 Essential (primary) hypertension: Secondary | ICD-10-CM | POA: Diagnosis not present

## 2017-05-11 DIAGNOSIS — D692 Other nonthrombocytopenic purpura: Secondary | ICD-10-CM | POA: Diagnosis not present

## 2017-06-01 DIAGNOSIS — E860 Dehydration: Secondary | ICD-10-CM | POA: Diagnosis not present

## 2017-11-29 DIAGNOSIS — E7800 Pure hypercholesterolemia, unspecified: Secondary | ICD-10-CM | POA: Diagnosis not present

## 2017-11-29 DIAGNOSIS — F419 Anxiety disorder, unspecified: Secondary | ICD-10-CM | POA: Diagnosis not present

## 2017-11-29 DIAGNOSIS — H5440 Blindness, one eye, unspecified eye: Secondary | ICD-10-CM | POA: Diagnosis not present

## 2017-11-29 DIAGNOSIS — J449 Chronic obstructive pulmonary disease, unspecified: Secondary | ICD-10-CM | POA: Diagnosis not present

## 2017-11-29 DIAGNOSIS — I10 Essential (primary) hypertension: Secondary | ICD-10-CM | POA: Diagnosis not present

## 2017-11-29 DIAGNOSIS — D692 Other nonthrombocytopenic purpura: Secondary | ICD-10-CM | POA: Diagnosis not present

## 2017-11-29 DIAGNOSIS — Z1211 Encounter for screening for malignant neoplasm of colon: Secondary | ICD-10-CM | POA: Diagnosis not present

## 2017-11-29 DIAGNOSIS — N529 Male erectile dysfunction, unspecified: Secondary | ICD-10-CM | POA: Diagnosis not present

## 2017-11-29 DIAGNOSIS — K219 Gastro-esophageal reflux disease without esophagitis: Secondary | ICD-10-CM | POA: Diagnosis not present

## 2017-11-29 DIAGNOSIS — Z23 Encounter for immunization: Secondary | ICD-10-CM | POA: Diagnosis not present

## 2018-05-16 DIAGNOSIS — J449 Chronic obstructive pulmonary disease, unspecified: Secondary | ICD-10-CM | POA: Diagnosis not present

## 2018-05-16 DIAGNOSIS — N529 Male erectile dysfunction, unspecified: Secondary | ICD-10-CM | POA: Diagnosis not present

## 2018-05-16 DIAGNOSIS — H5440 Blindness, one eye, unspecified eye: Secondary | ICD-10-CM | POA: Diagnosis not present

## 2018-05-16 DIAGNOSIS — I10 Essential (primary) hypertension: Secondary | ICD-10-CM | POA: Diagnosis not present

## 2018-05-16 DIAGNOSIS — D692 Other nonthrombocytopenic purpura: Secondary | ICD-10-CM | POA: Diagnosis not present

## 2018-05-16 DIAGNOSIS — F3181 Bipolar II disorder: Secondary | ICD-10-CM | POA: Diagnosis not present

## 2018-05-16 DIAGNOSIS — Z0000 Encounter for general adult medical examination without abnormal findings: Secondary | ICD-10-CM | POA: Diagnosis not present

## 2018-05-16 DIAGNOSIS — E7800 Pure hypercholesterolemia, unspecified: Secondary | ICD-10-CM | POA: Diagnosis not present

## 2018-05-16 DIAGNOSIS — K219 Gastro-esophageal reflux disease without esophagitis: Secondary | ICD-10-CM | POA: Diagnosis not present

## 2018-05-18 DIAGNOSIS — Z125 Encounter for screening for malignant neoplasm of prostate: Secondary | ICD-10-CM | POA: Diagnosis not present

## 2018-05-18 DIAGNOSIS — I10 Essential (primary) hypertension: Secondary | ICD-10-CM | POA: Diagnosis not present

## 2018-05-18 DIAGNOSIS — Z1211 Encounter for screening for malignant neoplasm of colon: Secondary | ICD-10-CM | POA: Diagnosis not present

## 2018-05-18 DIAGNOSIS — E7800 Pure hypercholesterolemia, unspecified: Secondary | ICD-10-CM | POA: Diagnosis not present

## 2018-05-25 DIAGNOSIS — Z1211 Encounter for screening for malignant neoplasm of colon: Secondary | ICD-10-CM | POA: Diagnosis not present

## 2018-05-30 ENCOUNTER — Other Ambulatory Visit: Payer: Self-pay | Admitting: Family Medicine

## 2018-05-30 DIAGNOSIS — Z136 Encounter for screening for cardiovascular disorders: Secondary | ICD-10-CM

## 2018-07-12 DIAGNOSIS — I10 Essential (primary) hypertension: Secondary | ICD-10-CM | POA: Diagnosis not present

## 2018-07-12 DIAGNOSIS — E785 Hyperlipidemia, unspecified: Secondary | ICD-10-CM | POA: Diagnosis not present

## 2018-07-12 DIAGNOSIS — F3181 Bipolar II disorder: Secondary | ICD-10-CM | POA: Diagnosis not present

## 2018-07-12 DIAGNOSIS — F329 Major depressive disorder, single episode, unspecified: Secondary | ICD-10-CM | POA: Diagnosis not present

## 2018-07-12 DIAGNOSIS — J449 Chronic obstructive pulmonary disease, unspecified: Secondary | ICD-10-CM | POA: Diagnosis not present

## 2018-07-12 DIAGNOSIS — E7800 Pure hypercholesterolemia, unspecified: Secondary | ICD-10-CM | POA: Diagnosis not present

## 2018-07-12 DIAGNOSIS — F324 Major depressive disorder, single episode, in partial remission: Secondary | ICD-10-CM | POA: Diagnosis not present

## 2018-11-16 DIAGNOSIS — F3181 Bipolar II disorder: Secondary | ICD-10-CM | POA: Diagnosis not present

## 2018-11-16 DIAGNOSIS — N529 Male erectile dysfunction, unspecified: Secondary | ICD-10-CM | POA: Diagnosis not present

## 2018-11-16 DIAGNOSIS — G2581 Restless legs syndrome: Secondary | ICD-10-CM | POA: Diagnosis not present

## 2018-11-16 DIAGNOSIS — D692 Other nonthrombocytopenic purpura: Secondary | ICD-10-CM | POA: Diagnosis not present

## 2018-11-16 DIAGNOSIS — H5440 Blindness, one eye, unspecified eye: Secondary | ICD-10-CM | POA: Diagnosis not present

## 2018-11-16 DIAGNOSIS — E7800 Pure hypercholesterolemia, unspecified: Secondary | ICD-10-CM | POA: Diagnosis not present

## 2018-11-16 DIAGNOSIS — J449 Chronic obstructive pulmonary disease, unspecified: Secondary | ICD-10-CM | POA: Diagnosis not present

## 2018-11-16 DIAGNOSIS — K219 Gastro-esophageal reflux disease without esophagitis: Secondary | ICD-10-CM | POA: Diagnosis not present

## 2018-11-16 DIAGNOSIS — I10 Essential (primary) hypertension: Secondary | ICD-10-CM | POA: Diagnosis not present

## 2018-12-07 DIAGNOSIS — I10 Essential (primary) hypertension: Secondary | ICD-10-CM | POA: Diagnosis not present

## 2018-12-07 DIAGNOSIS — Z23 Encounter for immunization: Secondary | ICD-10-CM | POA: Diagnosis not present

## 2019-05-16 DIAGNOSIS — F3181 Bipolar II disorder: Secondary | ICD-10-CM | POA: Diagnosis not present

## 2019-05-16 DIAGNOSIS — I10 Essential (primary) hypertension: Secondary | ICD-10-CM | POA: Diagnosis not present

## 2019-05-16 DIAGNOSIS — M545 Low back pain: Secondary | ICD-10-CM | POA: Diagnosis not present

## 2019-05-16 DIAGNOSIS — J449 Chronic obstructive pulmonary disease, unspecified: Secondary | ICD-10-CM | POA: Diagnosis not present

## 2019-05-16 DIAGNOSIS — E7800 Pure hypercholesterolemia, unspecified: Secondary | ICD-10-CM | POA: Diagnosis not present

## 2019-05-16 DIAGNOSIS — Z1389 Encounter for screening for other disorder: Secondary | ICD-10-CM | POA: Diagnosis not present

## 2019-05-16 DIAGNOSIS — H5440 Blindness, one eye, unspecified eye: Secondary | ICD-10-CM | POA: Diagnosis not present

## 2019-05-16 DIAGNOSIS — Z0000 Encounter for general adult medical examination without abnormal findings: Secondary | ICD-10-CM | POA: Diagnosis not present

## 2019-05-16 DIAGNOSIS — N529 Male erectile dysfunction, unspecified: Secondary | ICD-10-CM | POA: Diagnosis not present

## 2019-05-16 DIAGNOSIS — G2581 Restless legs syndrome: Secondary | ICD-10-CM | POA: Diagnosis not present

## 2019-05-16 DIAGNOSIS — K219 Gastro-esophageal reflux disease without esophagitis: Secondary | ICD-10-CM | POA: Diagnosis not present

## 2019-05-16 DIAGNOSIS — Z125 Encounter for screening for malignant neoplasm of prostate: Secondary | ICD-10-CM | POA: Diagnosis not present

## 2019-06-08 DIAGNOSIS — E860 Dehydration: Secondary | ICD-10-CM | POA: Diagnosis not present

## 2019-07-04 DIAGNOSIS — N289 Disorder of kidney and ureter, unspecified: Secondary | ICD-10-CM | POA: Diagnosis not present

## 2019-07-11 DIAGNOSIS — E785 Hyperlipidemia, unspecified: Secondary | ICD-10-CM | POA: Diagnosis not present

## 2019-07-11 DIAGNOSIS — I10 Essential (primary) hypertension: Secondary | ICD-10-CM | POA: Diagnosis not present

## 2019-07-11 DIAGNOSIS — F3181 Bipolar II disorder: Secondary | ICD-10-CM | POA: Diagnosis not present

## 2019-07-11 DIAGNOSIS — F329 Major depressive disorder, single episode, unspecified: Secondary | ICD-10-CM | POA: Diagnosis not present

## 2019-07-11 DIAGNOSIS — J449 Chronic obstructive pulmonary disease, unspecified: Secondary | ICD-10-CM | POA: Diagnosis not present

## 2019-07-11 DIAGNOSIS — E7800 Pure hypercholesterolemia, unspecified: Secondary | ICD-10-CM | POA: Diagnosis not present

## 2019-07-11 DIAGNOSIS — F324 Major depressive disorder, single episode, in partial remission: Secondary | ICD-10-CM | POA: Diagnosis not present

## 2019-07-11 DIAGNOSIS — N1831 Chronic kidney disease, stage 3a: Secondary | ICD-10-CM | POA: Diagnosis not present

## 2019-07-26 DIAGNOSIS — J449 Chronic obstructive pulmonary disease, unspecified: Secondary | ICD-10-CM | POA: Diagnosis not present

## 2019-07-26 DIAGNOSIS — E785 Hyperlipidemia, unspecified: Secondary | ICD-10-CM | POA: Diagnosis not present

## 2019-07-26 DIAGNOSIS — F3181 Bipolar II disorder: Secondary | ICD-10-CM | POA: Diagnosis not present

## 2019-07-26 DIAGNOSIS — I10 Essential (primary) hypertension: Secondary | ICD-10-CM | POA: Diagnosis not present

## 2019-07-26 DIAGNOSIS — E7800 Pure hypercholesterolemia, unspecified: Secondary | ICD-10-CM | POA: Diagnosis not present

## 2019-07-26 DIAGNOSIS — F324 Major depressive disorder, single episode, in partial remission: Secondary | ICD-10-CM | POA: Diagnosis not present

## 2019-07-26 DIAGNOSIS — N1831 Chronic kidney disease, stage 3a: Secondary | ICD-10-CM | POA: Diagnosis not present

## 2019-07-26 DIAGNOSIS — F329 Major depressive disorder, single episode, unspecified: Secondary | ICD-10-CM | POA: Diagnosis not present

## 2019-08-31 ENCOUNTER — Ambulatory Visit: Payer: Self-pay | Attending: Internal Medicine

## 2019-08-31 DIAGNOSIS — Z23 Encounter for immunization: Secondary | ICD-10-CM

## 2019-08-31 NOTE — Progress Notes (Signed)
   Covid-19 Vaccination Clinic  Name:  Jay [name]    MRN: [id number] DOB: 03/14/1950  08/31/2019  [name] was observed post Covid-19 immunization for 15 minutes without incident. He was provided with Vaccine Information Sheet and instruction to access the V-Safe system.   [name] was instructed to call 911 with any severe reactions post vaccine: Marland Kitchen Difficulty breathing  . Swelling of face and throat  . A fast heartbeat  . A bad rash all over body  . Dizziness and weakness   Immunizations Administered    Name Date Dose VIS Date Route   Pfizer COVID-19 Vaccine 08/31/2019  9:23 AM 0.3 mL 03/08/2018 Intramuscular   Manufacturer: [place]   Lot: [id number]   [place]: [id number]

## 2019-09-12 DIAGNOSIS — E785 Hyperlipidemia, unspecified: Secondary | ICD-10-CM | POA: Diagnosis not present

## 2019-09-12 DIAGNOSIS — I10 Essential (primary) hypertension: Secondary | ICD-10-CM | POA: Diagnosis not present

## 2019-09-12 DIAGNOSIS — F3181 Bipolar II disorder: Secondary | ICD-10-CM | POA: Diagnosis not present

## 2019-09-12 DIAGNOSIS — N1831 Chronic kidney disease, stage 3a: Secondary | ICD-10-CM | POA: Diagnosis not present

## 2019-09-12 DIAGNOSIS — F329 Major depressive disorder, single episode, unspecified: Secondary | ICD-10-CM | POA: Diagnosis not present

## 2019-09-12 DIAGNOSIS — J449 Chronic obstructive pulmonary disease, unspecified: Secondary | ICD-10-CM | POA: Diagnosis not present

## 2019-09-12 DIAGNOSIS — K219 Gastro-esophageal reflux disease without esophagitis: Secondary | ICD-10-CM | POA: Diagnosis not present

## 2019-09-12 DIAGNOSIS — E7800 Pure hypercholesterolemia, unspecified: Secondary | ICD-10-CM | POA: Diagnosis not present

## 2019-09-12 DIAGNOSIS — F324 Major depressive disorder, single episode, in partial remission: Secondary | ICD-10-CM | POA: Diagnosis not present

## 2019-09-21 ENCOUNTER — Ambulatory Visit: Payer: Medicare HMO | Attending: Internal Medicine

## 2019-09-21 DIAGNOSIS — Z23 Encounter for immunization: Secondary | ICD-10-CM

## 2019-09-21 NOTE — Progress Notes (Signed)
   Covid-19 Vaccination Clinic  Name:  Jay [name]    MRN: [id number] DOB: January 24, 1950  09/21/2019  [name] was observed post Covid-19 immunization for 15 minutes without incident. He was provided with Vaccine Information Sheet and instruction to access the V-Safe system.   [name] was instructed to call 911 with any severe reactions post vaccine: Marland Kitchen Difficulty breathing  . Swelling of face and throat  . A fast heartbeat  . A bad rash all over body  . Dizziness and weakness   Immunizations Administered    Name Date Dose VIS Date Route   Pfizer COVID-19 Vaccine 09/21/2019  9:16 AM 0.3 mL 03/08/2018 Intramuscular   Manufacturer: [employer]   Lot: [id number]   [place]: [id number]

## 2019-09-28 DIAGNOSIS — F3181 Bipolar II disorder: Secondary | ICD-10-CM | POA: Diagnosis not present

## 2019-09-28 DIAGNOSIS — K219 Gastro-esophageal reflux disease without esophagitis: Secondary | ICD-10-CM | POA: Diagnosis not present

## 2019-09-28 DIAGNOSIS — F324 Major depressive disorder, single episode, in partial remission: Secondary | ICD-10-CM | POA: Diagnosis not present

## 2019-09-28 DIAGNOSIS — E7800 Pure hypercholesterolemia, unspecified: Secondary | ICD-10-CM | POA: Diagnosis not present

## 2019-09-28 DIAGNOSIS — E785 Hyperlipidemia, unspecified: Secondary | ICD-10-CM | POA: Diagnosis not present

## 2019-09-28 DIAGNOSIS — I10 Essential (primary) hypertension: Secondary | ICD-10-CM | POA: Diagnosis not present

## 2019-09-28 DIAGNOSIS — N1831 Chronic kidney disease, stage 3a: Secondary | ICD-10-CM | POA: Diagnosis not present

## 2019-09-28 DIAGNOSIS — J449 Chronic obstructive pulmonary disease, unspecified: Secondary | ICD-10-CM | POA: Diagnosis not present

## 2019-09-28 DIAGNOSIS — F329 Major depressive disorder, single episode, unspecified: Secondary | ICD-10-CM | POA: Diagnosis not present

## 2019-11-06 DIAGNOSIS — E785 Hyperlipidemia, unspecified: Secondary | ICD-10-CM | POA: Diagnosis not present

## 2019-11-06 DIAGNOSIS — J449 Chronic obstructive pulmonary disease, unspecified: Secondary | ICD-10-CM | POA: Diagnosis not present

## 2019-11-06 DIAGNOSIS — I10 Essential (primary) hypertension: Secondary | ICD-10-CM | POA: Diagnosis not present

## 2019-11-06 DIAGNOSIS — G300 Alzheimer's disease with early onset: Secondary | ICD-10-CM | POA: Diagnosis not present

## 2019-11-06 DIAGNOSIS — F329 Major depressive disorder, single episode, unspecified: Secondary | ICD-10-CM | POA: Diagnosis not present

## 2019-11-10 DIAGNOSIS — I10 Essential (primary) hypertension: Secondary | ICD-10-CM | POA: Diagnosis not present

## 2019-11-12 DIAGNOSIS — I10 Essential (primary) hypertension: Secondary | ICD-10-CM | POA: Diagnosis not present

## 2019-11-21 DIAGNOSIS — H5440 Blindness, one eye, unspecified eye: Secondary | ICD-10-CM | POA: Diagnosis not present

## 2019-11-21 DIAGNOSIS — I129 Hypertensive chronic kidney disease with stage 1 through stage 4 chronic kidney disease, or unspecified chronic kidney disease: Secondary | ICD-10-CM | POA: Diagnosis not present

## 2019-11-21 DIAGNOSIS — E7800 Pure hypercholesterolemia, unspecified: Secondary | ICD-10-CM | POA: Diagnosis not present

## 2019-11-21 DIAGNOSIS — F3181 Bipolar II disorder: Secondary | ICD-10-CM | POA: Diagnosis not present

## 2019-11-21 DIAGNOSIS — K219 Gastro-esophageal reflux disease without esophagitis: Secondary | ICD-10-CM | POA: Diagnosis not present

## 2019-11-21 DIAGNOSIS — G300 Alzheimer's disease with early onset: Secondary | ICD-10-CM | POA: Diagnosis not present

## 2019-11-21 DIAGNOSIS — G2581 Restless legs syndrome: Secondary | ICD-10-CM | POA: Diagnosis not present

## 2019-11-21 DIAGNOSIS — D692 Other nonthrombocytopenic purpura: Secondary | ICD-10-CM | POA: Diagnosis not present

## 2019-11-21 DIAGNOSIS — M545 Low back pain, unspecified: Secondary | ICD-10-CM | POA: Diagnosis not present

## 2019-11-21 DIAGNOSIS — N1831 Chronic kidney disease, stage 3a: Secondary | ICD-10-CM | POA: Diagnosis not present

## 2019-11-21 DIAGNOSIS — Z23 Encounter for immunization: Secondary | ICD-10-CM | POA: Diagnosis not present

## 2019-11-21 DIAGNOSIS — J449 Chronic obstructive pulmonary disease, unspecified: Secondary | ICD-10-CM | POA: Diagnosis not present

## 2019-11-28 DIAGNOSIS — Z1211 Encounter for screening for malignant neoplasm of colon: Secondary | ICD-10-CM | POA: Diagnosis not present

## 2019-12-04 DIAGNOSIS — I10 Essential (primary) hypertension: Secondary | ICD-10-CM | POA: Diagnosis not present

## 2019-12-06 DIAGNOSIS — N1831 Chronic kidney disease, stage 3a: Secondary | ICD-10-CM | POA: Diagnosis not present

## 2019-12-06 DIAGNOSIS — J449 Chronic obstructive pulmonary disease, unspecified: Secondary | ICD-10-CM | POA: Diagnosis not present

## 2019-12-06 DIAGNOSIS — E7800 Pure hypercholesterolemia, unspecified: Secondary | ICD-10-CM | POA: Diagnosis not present

## 2019-12-06 DIAGNOSIS — I10 Essential (primary) hypertension: Secondary | ICD-10-CM | POA: Diagnosis not present

## 2019-12-06 DIAGNOSIS — F329 Major depressive disorder, single episode, unspecified: Secondary | ICD-10-CM | POA: Diagnosis not present

## 2019-12-06 DIAGNOSIS — F324 Major depressive disorder, single episode, in partial remission: Secondary | ICD-10-CM | POA: Diagnosis not present

## 2019-12-06 DIAGNOSIS — F3181 Bipolar II disorder: Secondary | ICD-10-CM | POA: Diagnosis not present

## 2019-12-06 DIAGNOSIS — K219 Gastro-esophageal reflux disease without esophagitis: Secondary | ICD-10-CM | POA: Diagnosis not present

## 2019-12-06 DIAGNOSIS — E785 Hyperlipidemia, unspecified: Secondary | ICD-10-CM | POA: Diagnosis not present

## 2019-12-11 DIAGNOSIS — I10 Essential (primary) hypertension: Secondary | ICD-10-CM | POA: Diagnosis not present

## 2019-12-12 DIAGNOSIS — N289 Disorder of kidney and ureter, unspecified: Secondary | ICD-10-CM | POA: Diagnosis not present

## 2019-12-22 DIAGNOSIS — I10 Essential (primary) hypertension: Secondary | ICD-10-CM | POA: Diagnosis not present

## 2019-12-22 DIAGNOSIS — E785 Hyperlipidemia, unspecified: Secondary | ICD-10-CM | POA: Diagnosis not present

## 2019-12-22 DIAGNOSIS — F3181 Bipolar II disorder: Secondary | ICD-10-CM | POA: Diagnosis not present

## 2019-12-22 DIAGNOSIS — K219 Gastro-esophageal reflux disease without esophagitis: Secondary | ICD-10-CM | POA: Diagnosis not present

## 2019-12-22 DIAGNOSIS — I129 Hypertensive chronic kidney disease with stage 1 through stage 4 chronic kidney disease, or unspecified chronic kidney disease: Secondary | ICD-10-CM | POA: Diagnosis not present

## 2019-12-22 DIAGNOSIS — E7800 Pure hypercholesterolemia, unspecified: Secondary | ICD-10-CM | POA: Diagnosis not present

## 2019-12-22 DIAGNOSIS — N1831 Chronic kidney disease, stage 3a: Secondary | ICD-10-CM | POA: Diagnosis not present

## 2019-12-22 DIAGNOSIS — J449 Chronic obstructive pulmonary disease, unspecified: Secondary | ICD-10-CM | POA: Diagnosis not present

## 2019-12-22 DIAGNOSIS — F329 Major depressive disorder, single episode, unspecified: Secondary | ICD-10-CM | POA: Diagnosis not present

## 2020-01-12 DIAGNOSIS — I10 Essential (primary) hypertension: Secondary | ICD-10-CM | POA: Diagnosis not present

## 2020-01-26 DIAGNOSIS — N1831 Chronic kidney disease, stage 3a: Secondary | ICD-10-CM | POA: Diagnosis not present

## 2020-01-26 DIAGNOSIS — F324 Major depressive disorder, single episode, in partial remission: Secondary | ICD-10-CM | POA: Diagnosis not present

## 2020-01-26 DIAGNOSIS — F329 Major depressive disorder, single episode, unspecified: Secondary | ICD-10-CM | POA: Diagnosis not present

## 2020-01-26 DIAGNOSIS — J449 Chronic obstructive pulmonary disease, unspecified: Secondary | ICD-10-CM | POA: Diagnosis not present

## 2020-01-26 DIAGNOSIS — F3181 Bipolar II disorder: Secondary | ICD-10-CM | POA: Diagnosis not present

## 2020-01-26 DIAGNOSIS — E7800 Pure hypercholesterolemia, unspecified: Secondary | ICD-10-CM | POA: Diagnosis not present

## 2020-01-26 DIAGNOSIS — K219 Gastro-esophageal reflux disease without esophagitis: Secondary | ICD-10-CM | POA: Diagnosis not present

## 2020-01-26 DIAGNOSIS — I10 Essential (primary) hypertension: Secondary | ICD-10-CM | POA: Diagnosis not present

## 2020-01-26 DIAGNOSIS — E785 Hyperlipidemia, unspecified: Secondary | ICD-10-CM | POA: Diagnosis not present

## 2020-02-12 DIAGNOSIS — I10 Essential (primary) hypertension: Secondary | ICD-10-CM | POA: Diagnosis not present

## 2020-02-26 DIAGNOSIS — F324 Major depressive disorder, single episode, in partial remission: Secondary | ICD-10-CM | POA: Diagnosis not present

## 2020-02-26 DIAGNOSIS — N1831 Chronic kidney disease, stage 3a: Secondary | ICD-10-CM | POA: Diagnosis not present

## 2020-02-26 DIAGNOSIS — K219 Gastro-esophageal reflux disease without esophagitis: Secondary | ICD-10-CM | POA: Diagnosis not present

## 2020-02-26 DIAGNOSIS — F3181 Bipolar II disorder: Secondary | ICD-10-CM | POA: Diagnosis not present

## 2020-02-26 DIAGNOSIS — I10 Essential (primary) hypertension: Secondary | ICD-10-CM | POA: Diagnosis not present

## 2020-02-26 DIAGNOSIS — F329 Major depressive disorder, single episode, unspecified: Secondary | ICD-10-CM | POA: Diagnosis not present

## 2020-02-26 DIAGNOSIS — E785 Hyperlipidemia, unspecified: Secondary | ICD-10-CM | POA: Diagnosis not present

## 2020-02-26 DIAGNOSIS — J449 Chronic obstructive pulmonary disease, unspecified: Secondary | ICD-10-CM | POA: Diagnosis not present

## 2020-02-26 DIAGNOSIS — E7800 Pure hypercholesterolemia, unspecified: Secondary | ICD-10-CM | POA: Diagnosis not present

## 2020-04-10 DIAGNOSIS — F324 Major depressive disorder, single episode, in partial remission: Secondary | ICD-10-CM | POA: Diagnosis not present

## 2020-04-10 DIAGNOSIS — J449 Chronic obstructive pulmonary disease, unspecified: Secondary | ICD-10-CM | POA: Diagnosis not present

## 2020-04-10 DIAGNOSIS — F3181 Bipolar II disorder: Secondary | ICD-10-CM | POA: Diagnosis not present

## 2020-04-10 DIAGNOSIS — E785 Hyperlipidemia, unspecified: Secondary | ICD-10-CM | POA: Diagnosis not present

## 2020-04-10 DIAGNOSIS — D692 Other nonthrombocytopenic purpura: Secondary | ICD-10-CM | POA: Diagnosis not present

## 2020-04-10 DIAGNOSIS — I10 Essential (primary) hypertension: Secondary | ICD-10-CM | POA: Diagnosis not present

## 2020-04-10 DIAGNOSIS — N1831 Chronic kidney disease, stage 3a: Secondary | ICD-10-CM | POA: Diagnosis not present

## 2020-04-10 DIAGNOSIS — E7800 Pure hypercholesterolemia, unspecified: Secondary | ICD-10-CM | POA: Diagnosis not present

## 2020-04-10 DIAGNOSIS — K219 Gastro-esophageal reflux disease without esophagitis: Secondary | ICD-10-CM | POA: Diagnosis not present

## 2020-04-10 DIAGNOSIS — F329 Major depressive disorder, single episode, unspecified: Secondary | ICD-10-CM | POA: Diagnosis not present

## 2020-04-18 ENCOUNTER — Encounter: Payer: Self-pay | Admitting: Emergency Medicine

## 2020-04-18 ENCOUNTER — Ambulatory Visit
Admission: EM | Admit: 2020-04-18 | Discharge: 2020-04-18 | Disposition: A | Discharge status: Home / Self Care | Payer: Medicare HMO | Attending: Emergency Medicine | Admitting: Emergency Medicine

## 2020-04-18 ENCOUNTER — Other Ambulatory Visit: Payer: Self-pay

## 2020-04-18 DIAGNOSIS — D229 Melanocytic nevi, unspecified: Secondary | ICD-10-CM

## 2020-04-18 DIAGNOSIS — W57XXXA Bitten or stung by nonvenomous insect and other nonvenomous arthropods, initial encounter: Secondary | ICD-10-CM | POA: Diagnosis not present

## 2020-04-18 DIAGNOSIS — S70361A Insect bite (nonvenomous), right thigh, initial encounter: Secondary | ICD-10-CM

## 2020-04-18 DIAGNOSIS — R6889 Other general symptoms and signs: Secondary | ICD-10-CM

## 2020-04-18 MED ORDER — DOXYCYCLINE HYCLATE 100 MG PO CAPS: 100.0000 mg | ORAL_CAPSULE | Freq: Two times a day (BID) | ORAL | 0 refills | Status: AC

## 2020-04-18 NOTE — ED Triage Notes (Signed)
Feels tired with joint pain.  States he pulled a tick off the back of his leg a few days ago.

## 2020-04-18 NOTE — ED Provider Notes (Signed)
[place]   [id number] 04/18/20 Arrival Time: 1850  CC: Tick bite  SUBJECTIVE:  Jay [name] is a 70 y.o. male Complains of attached tick to back of RT leg that he removed a few days ago.  Unsure how long it had been attached.  Denies alleviating or aggravating factors.  Reports previous hx of tick bite.  Reports fatigue, myalgias, arthralgia, headache, neck stiffness, and rash to RT side.  Denies fever, chills, nausea, vomiting, dizziness, weakness, abdominal pain.    ROS: As per HPI.  All other pertinent ROS negative.     Past Medical History:  Diagnosis Date  . Anxiety   . Arthritis   . COPD (chronic obstructive pulmonary disease) ([place])   . Depression   . GERD (gastroesophageal reflux disease)   . Hypertension   . Shortness of breath    with excertion   Past Surgical History:  Procedure Laterality Date  . APPENDECTOMY    . EYE SURGERY     No Known Allergies No current facility-administered medications on file prior to encounter.   Current Outpatient Medications on File Prior to Encounter  Medication Sig Dispense Refill  . ARIPiprazole (ABILIFY) 5 MG tablet Take 5 mg by mouth daily.    Marland Kitchen aspirin 81 MG tablet Take 81 mg by mouth daily.    Marland Kitchen atorvastatin (LIPITOR) 20 MG tablet Take 20 mg by mouth daily.    Marland Kitchen escitalopram (LEXAPRO) 20 MG tablet Take 20 mg by mouth 2 (two) times daily.    Marland Kitchen esomeprazole (NEXIUM) 40 MG capsule Take 40 mg by mouth daily before breakfast.    . lamoTRIgine (LAMICTAL) 200 MG tablet Take 200 mg by mouth daily.    Marland Kitchen lisinopril-hydrochlorothiazide (PRINZIDE,ZESTORETIC) 10-12.5 MG tablet Take 1 tablet by mouth daily.    . nabumetone (RELAFEN) 500 MG tablet Take 1,000 mg by mouth 2 (two) times daily.    . predniSONE (STERAPRED UNI-PAK 21 TAB) 5 MG (21) TBPK tablet Take 6 pills first day; 5 pills second day; 4 pills third day; 3 pills fourth day; 2 pills next day and 1 pill last day. 21 tablet 0   Social History   Socioeconomic History   . Marital status: Married    Spouse name: Not on file  . Number of children: Not on file  . Years of education: Not on file  . Highest education level: Not on file  Occupational History  . Not on file  Tobacco Use  . Smoking status: Former Research scientist (life sciences)  . Smokeless tobacco: Never Used  Substance and Sexual Activity  . Alcohol use: No  . Drug use: No  . Sexual activity: Not on file  Other Topics Concern  . Not on file  Social History Narrative  . Not on file   Social Determinants of Health   Financial Resource Strain: Not on file  Food Insecurity: Not on file  Transportation Needs: Not on file  Physical Activity: Not on file  Stress: Not on file  Social Connections: Not on file  Intimate Partner Violence: Not on file   Family History  Problem Relation Age of Onset  . Hypertension Mother   . Cancer Father   . Hypertension Father   . Hypertension Sister   . Cancer Brother   . Hypertension Brother     OBJECTIVE: Vitals:   04/18/20 1859  BP: (!) 164/77  Pulse: 90  Resp: 18  Temp: 98.9 F (37.2 C)  TempSrc: Oral  SpO2: 93%  General appearance: alert; no distress Head: NCAT Lungs: normal respiratory effort Extremities: no edema Skin: warm and dry; punctate lesion to back of thigh without surrounding erythema, redness, or bleeding; incidentally abnormally shaped mole to RT abdomen that appears dark in color, not elevated, < 0.5 cm in diameter Psychological: alert and cooperative; normal mood and affect  ASSESSMENT & PLAN:  1. Tick bite of right thigh, initial encounter   2. Flu-like symptoms   3. Atypical mole     Meds ordered this encounter  Medications  . doxycycline (VIBRAMYCIN) 100 MG capsule    Sig: Take 1 capsule (100 mg total) by mouth 2 (two) times daily for 14 days.    Dispense:  28 capsule    Refill:  0    Order Specific Question:   Supervising Provider    Answer:   Raylene Everts [[id number]]   Prescribed doxycycline.  Take as directed and to  completion To prevent tick bites, wear long sleeves, long pants, and light colors. Use insect repellent. Follow the instructions on the bottle. If the tick is biting, do not try to remove it with heat, alcohol, petroleum jelly, or fingernail polish. Use tweezers, curved forceps, or a tick-removal tool to grasp the tick. Gently pull up until the tick lets go. Do not twist or jerk the tick. Do not squeeze or crush the tick. Return here or go to ER if you have any new or worsening symptoms (rash, nausea, vomiting, fever, chills, headache, fatigue)  Instructed patient and family to follow up with PCP for mole evaluation    Reviewed expectations re: course of current medical issues. Questions answered. Outlined signs and symptoms indicating need for more acute intervention. Patient verbalized understanding. After Visit Summary given.   Lestine Box, PA-C 04/18/20 1928

## 2020-04-18 NOTE — Discharge Instructions (Signed)
Prescribed doxycycline.  Take as directed and to completion To prevent tick bites, wear long sleeves, long pants, and light colors. Use insect repellent. Follow the instructions on the bottle. If the tick is biting, do not try to remove it with heat, alcohol, petroleum jelly, or fingernail polish. Use tweezers, curved forceps, or a tick-removal tool to grasp the tick. Gently pull up until the tick lets go. Do not twist or jerk the tick. Do not squeeze or crush the tick. Return here or go to ER if you have any new or worsening symptoms (rash, nausea, vomiting, fever, chills, headache, fatigue)

## 2020-05-06 DIAGNOSIS — E785 Hyperlipidemia, unspecified: Secondary | ICD-10-CM | POA: Diagnosis not present

## 2020-05-06 DIAGNOSIS — I129 Hypertensive chronic kidney disease with stage 1 through stage 4 chronic kidney disease, or unspecified chronic kidney disease: Secondary | ICD-10-CM | POA: Diagnosis not present

## 2020-05-06 DIAGNOSIS — F324 Major depressive disorder, single episode, in partial remission: Secondary | ICD-10-CM | POA: Diagnosis not present

## 2020-05-06 DIAGNOSIS — F3181 Bipolar II disorder: Secondary | ICD-10-CM | POA: Diagnosis not present

## 2020-05-06 DIAGNOSIS — I10 Essential (primary) hypertension: Secondary | ICD-10-CM | POA: Diagnosis not present

## 2020-05-06 DIAGNOSIS — J449 Chronic obstructive pulmonary disease, unspecified: Secondary | ICD-10-CM | POA: Diagnosis not present

## 2020-05-06 DIAGNOSIS — N1831 Chronic kidney disease, stage 3a: Secondary | ICD-10-CM | POA: Diagnosis not present

## 2020-05-06 DIAGNOSIS — K219 Gastro-esophageal reflux disease without esophagitis: Secondary | ICD-10-CM | POA: Diagnosis not present

## 2020-05-06 DIAGNOSIS — G300 Alzheimer's disease with early onset: Secondary | ICD-10-CM | POA: Diagnosis not present

## 2020-05-09 DIAGNOSIS — I10 Essential (primary) hypertension: Secondary | ICD-10-CM | POA: Diagnosis not present

## 2020-05-16 DIAGNOSIS — F324 Major depressive disorder, single episode, in partial remission: Secondary | ICD-10-CM | POA: Diagnosis not present

## 2020-05-16 DIAGNOSIS — E7800 Pure hypercholesterolemia, unspecified: Secondary | ICD-10-CM | POA: Diagnosis not present

## 2020-05-16 DIAGNOSIS — N1831 Chronic kidney disease, stage 3a: Secondary | ICD-10-CM | POA: Diagnosis not present

## 2020-05-16 DIAGNOSIS — F329 Major depressive disorder, single episode, unspecified: Secondary | ICD-10-CM | POA: Diagnosis not present

## 2020-05-16 DIAGNOSIS — E785 Hyperlipidemia, unspecified: Secondary | ICD-10-CM | POA: Diagnosis not present

## 2020-05-16 DIAGNOSIS — J449 Chronic obstructive pulmonary disease, unspecified: Secondary | ICD-10-CM | POA: Diagnosis not present

## 2020-05-16 DIAGNOSIS — K219 Gastro-esophageal reflux disease without esophagitis: Secondary | ICD-10-CM | POA: Diagnosis not present

## 2020-05-16 DIAGNOSIS — F3181 Bipolar II disorder: Secondary | ICD-10-CM | POA: Diagnosis not present

## 2020-05-16 DIAGNOSIS — I10 Essential (primary) hypertension: Secondary | ICD-10-CM | POA: Diagnosis not present

## 2020-05-23 DIAGNOSIS — G300 Alzheimer's disease with early onset: Secondary | ICD-10-CM | POA: Diagnosis not present

## 2020-05-23 DIAGNOSIS — G2581 Restless legs syndrome: Secondary | ICD-10-CM | POA: Diagnosis not present

## 2020-05-23 DIAGNOSIS — Z0000 Encounter for general adult medical examination without abnormal findings: Secondary | ICD-10-CM | POA: Diagnosis not present

## 2020-05-23 DIAGNOSIS — H5440 Blindness, one eye, unspecified eye: Secondary | ICD-10-CM | POA: Diagnosis not present

## 2020-05-23 DIAGNOSIS — N1831 Chronic kidney disease, stage 3a: Secondary | ICD-10-CM | POA: Diagnosis not present

## 2020-05-23 DIAGNOSIS — J449 Chronic obstructive pulmonary disease, unspecified: Secondary | ICD-10-CM | POA: Diagnosis not present

## 2020-05-23 DIAGNOSIS — Z1389 Encounter for screening for other disorder: Secondary | ICD-10-CM | POA: Diagnosis not present

## 2020-05-23 DIAGNOSIS — Z125 Encounter for screening for malignant neoplasm of prostate: Secondary | ICD-10-CM | POA: Diagnosis not present

## 2020-05-23 DIAGNOSIS — I129 Hypertensive chronic kidney disease with stage 1 through stage 4 chronic kidney disease, or unspecified chronic kidney disease: Secondary | ICD-10-CM | POA: Diagnosis not present

## 2020-05-23 DIAGNOSIS — E7800 Pure hypercholesterolemia, unspecified: Secondary | ICD-10-CM | POA: Diagnosis not present

## 2020-05-23 DIAGNOSIS — F3181 Bipolar II disorder: Secondary | ICD-10-CM | POA: Diagnosis not present

## 2020-05-23 DIAGNOSIS — K219 Gastro-esophageal reflux disease without esophagitis: Secondary | ICD-10-CM | POA: Diagnosis not present

## 2020-05-27 ENCOUNTER — Other Ambulatory Visit: Payer: Self-pay | Admitting: Family Medicine

## 2020-05-27 DIAGNOSIS — Z136 Encounter for screening for cardiovascular disorders: Secondary | ICD-10-CM

## 2020-06-03 ENCOUNTER — Ambulatory Visit
Admission: RE | Admit: 2020-06-03 | Discharge: 2020-06-03 | Disposition: A | Discharge status: Home / Self Care | Payer: Medicare HMO | Source: Ambulatory Visit | Attending: Family Medicine | Admitting: Family Medicine

## 2020-06-03 DIAGNOSIS — Z87891 Personal history of nicotine dependence: Secondary | ICD-10-CM | POA: Diagnosis not present

## 2020-06-03 DIAGNOSIS — Z136 Encounter for screening for cardiovascular disorders: Secondary | ICD-10-CM

## 2020-06-07 DIAGNOSIS — I10 Essential (primary) hypertension: Secondary | ICD-10-CM | POA: Diagnosis not present

## 2020-07-05 DIAGNOSIS — M5441 Lumbago with sciatica, right side: Secondary | ICD-10-CM | POA: Diagnosis not present

## 2020-07-05 DIAGNOSIS — M25551 Pain in right hip: Secondary | ICD-10-CM | POA: Diagnosis not present

## 2020-07-05 DIAGNOSIS — M9901 Segmental and somatic dysfunction of cervical region: Secondary | ICD-10-CM | POA: Diagnosis not present

## 2020-07-05 DIAGNOSIS — M9902 Segmental and somatic dysfunction of thoracic region: Secondary | ICD-10-CM | POA: Diagnosis not present

## 2020-07-05 DIAGNOSIS — M542 Cervicalgia: Secondary | ICD-10-CM | POA: Diagnosis not present

## 2020-07-05 DIAGNOSIS — M9903 Segmental and somatic dysfunction of lumbar region: Secondary | ICD-10-CM | POA: Diagnosis not present

## 2020-07-05 DIAGNOSIS — M9905 Segmental and somatic dysfunction of pelvic region: Secondary | ICD-10-CM | POA: Diagnosis not present

## 2020-07-08 DIAGNOSIS — M9903 Segmental and somatic dysfunction of lumbar region: Secondary | ICD-10-CM | POA: Diagnosis not present

## 2020-07-08 DIAGNOSIS — M25551 Pain in right hip: Secondary | ICD-10-CM | POA: Diagnosis not present

## 2020-07-08 DIAGNOSIS — M542 Cervicalgia: Secondary | ICD-10-CM | POA: Diagnosis not present

## 2020-07-08 DIAGNOSIS — M5441 Lumbago with sciatica, right side: Secondary | ICD-10-CM | POA: Diagnosis not present

## 2020-07-08 DIAGNOSIS — M9902 Segmental and somatic dysfunction of thoracic region: Secondary | ICD-10-CM | POA: Diagnosis not present

## 2020-07-08 DIAGNOSIS — M9901 Segmental and somatic dysfunction of cervical region: Secondary | ICD-10-CM | POA: Diagnosis not present

## 2020-07-08 DIAGNOSIS — M9905 Segmental and somatic dysfunction of pelvic region: Secondary | ICD-10-CM | POA: Diagnosis not present

## 2020-07-11 DIAGNOSIS — I129 Hypertensive chronic kidney disease with stage 1 through stage 4 chronic kidney disease, or unspecified chronic kidney disease: Secondary | ICD-10-CM | POA: Diagnosis not present

## 2020-07-11 DIAGNOSIS — K219 Gastro-esophageal reflux disease without esophagitis: Secondary | ICD-10-CM | POA: Diagnosis not present

## 2020-07-11 DIAGNOSIS — J449 Chronic obstructive pulmonary disease, unspecified: Secondary | ICD-10-CM | POA: Diagnosis not present

## 2020-07-11 DIAGNOSIS — F329 Major depressive disorder, single episode, unspecified: Secondary | ICD-10-CM | POA: Diagnosis not present

## 2020-07-11 DIAGNOSIS — E7800 Pure hypercholesterolemia, unspecified: Secondary | ICD-10-CM | POA: Diagnosis not present

## 2020-07-11 DIAGNOSIS — F3181 Bipolar II disorder: Secondary | ICD-10-CM | POA: Diagnosis not present

## 2020-07-11 DIAGNOSIS — I10 Essential (primary) hypertension: Secondary | ICD-10-CM | POA: Diagnosis not present

## 2020-07-11 DIAGNOSIS — N1831 Chronic kidney disease, stage 3a: Secondary | ICD-10-CM | POA: Diagnosis not present

## 2020-07-11 DIAGNOSIS — E785 Hyperlipidemia, unspecified: Secondary | ICD-10-CM | POA: Diagnosis not present

## 2020-07-12 DIAGNOSIS — M9905 Segmental and somatic dysfunction of pelvic region: Secondary | ICD-10-CM | POA: Diagnosis not present

## 2020-07-12 DIAGNOSIS — M5441 Lumbago with sciatica, right side: Secondary | ICD-10-CM | POA: Diagnosis not present

## 2020-07-12 DIAGNOSIS — M9901 Segmental and somatic dysfunction of cervical region: Secondary | ICD-10-CM | POA: Diagnosis not present

## 2020-07-12 DIAGNOSIS — M9902 Segmental and somatic dysfunction of thoracic region: Secondary | ICD-10-CM | POA: Diagnosis not present

## 2020-07-12 DIAGNOSIS — M9903 Segmental and somatic dysfunction of lumbar region: Secondary | ICD-10-CM | POA: Diagnosis not present

## 2020-07-12 DIAGNOSIS — M542 Cervicalgia: Secondary | ICD-10-CM | POA: Diagnosis not present

## 2020-07-12 DIAGNOSIS — M25551 Pain in right hip: Secondary | ICD-10-CM | POA: Diagnosis not present

## 2020-08-05 DIAGNOSIS — J449 Chronic obstructive pulmonary disease, unspecified: Secondary | ICD-10-CM | POA: Diagnosis not present

## 2020-08-05 DIAGNOSIS — I10 Essential (primary) hypertension: Secondary | ICD-10-CM | POA: Diagnosis not present

## 2020-08-05 DIAGNOSIS — M542 Cervicalgia: Secondary | ICD-10-CM | POA: Diagnosis not present

## 2020-08-09 DIAGNOSIS — N1831 Chronic kidney disease, stage 3a: Secondary | ICD-10-CM | POA: Diagnosis not present

## 2020-08-09 DIAGNOSIS — K219 Gastro-esophageal reflux disease without esophagitis: Secondary | ICD-10-CM | POA: Diagnosis not present

## 2020-08-09 DIAGNOSIS — F329 Major depressive disorder, single episode, unspecified: Secondary | ICD-10-CM | POA: Diagnosis not present

## 2020-08-09 DIAGNOSIS — I10 Essential (primary) hypertension: Secondary | ICD-10-CM | POA: Diagnosis not present

## 2020-08-09 DIAGNOSIS — F3181 Bipolar II disorder: Secondary | ICD-10-CM | POA: Diagnosis not present

## 2020-08-09 DIAGNOSIS — J449 Chronic obstructive pulmonary disease, unspecified: Secondary | ICD-10-CM | POA: Diagnosis not present

## 2020-08-09 DIAGNOSIS — E785 Hyperlipidemia, unspecified: Secondary | ICD-10-CM | POA: Diagnosis not present

## 2020-08-09 DIAGNOSIS — E7800 Pure hypercholesterolemia, unspecified: Secondary | ICD-10-CM | POA: Diagnosis not present

## 2020-08-09 DIAGNOSIS — F324 Major depressive disorder, single episode, in partial remission: Secondary | ICD-10-CM | POA: Diagnosis not present

## 2020-09-04 DIAGNOSIS — K219 Gastro-esophageal reflux disease without esophagitis: Secondary | ICD-10-CM | POA: Diagnosis not present

## 2020-09-04 DIAGNOSIS — E7800 Pure hypercholesterolemia, unspecified: Secondary | ICD-10-CM | POA: Diagnosis not present

## 2020-09-04 DIAGNOSIS — G300 Alzheimer's disease with early onset: Secondary | ICD-10-CM | POA: Diagnosis not present

## 2020-09-04 DIAGNOSIS — J449 Chronic obstructive pulmonary disease, unspecified: Secondary | ICD-10-CM | POA: Diagnosis not present

## 2020-09-04 DIAGNOSIS — I129 Hypertensive chronic kidney disease with stage 1 through stage 4 chronic kidney disease, or unspecified chronic kidney disease: Secondary | ICD-10-CM | POA: Diagnosis not present

## 2020-09-04 DIAGNOSIS — I10 Essential (primary) hypertension: Secondary | ICD-10-CM | POA: Diagnosis not present

## 2020-09-04 DIAGNOSIS — E785 Hyperlipidemia, unspecified: Secondary | ICD-10-CM | POA: Diagnosis not present

## 2020-09-04 DIAGNOSIS — F3181 Bipolar II disorder: Secondary | ICD-10-CM | POA: Diagnosis not present

## 2020-09-04 DIAGNOSIS — N1831 Chronic kidney disease, stage 3a: Secondary | ICD-10-CM | POA: Diagnosis not present

## 2020-09-10 DIAGNOSIS — I10 Essential (primary) hypertension: Secondary | ICD-10-CM | POA: Diagnosis not present

## 2020-10-01 DIAGNOSIS — M545 Low back pain, unspecified: Secondary | ICD-10-CM | POA: Diagnosis not present

## 2020-10-01 DIAGNOSIS — Z6831 Body mass index (BMI) 31.0-31.9, adult: Secondary | ICD-10-CM | POA: Diagnosis not present

## 2020-10-01 DIAGNOSIS — M47892 Other spondylosis, cervical region: Secondary | ICD-10-CM | POA: Diagnosis not present

## 2020-10-01 DIAGNOSIS — M542 Cervicalgia: Secondary | ICD-10-CM | POA: Diagnosis not present

## 2020-10-10 DIAGNOSIS — I129 Hypertensive chronic kidney disease with stage 1 through stage 4 chronic kidney disease, or unspecified chronic kidney disease: Secondary | ICD-10-CM | POA: Diagnosis not present

## 2020-10-11 DIAGNOSIS — E785 Hyperlipidemia, unspecified: Secondary | ICD-10-CM | POA: Diagnosis not present

## 2020-10-11 DIAGNOSIS — F3181 Bipolar II disorder: Secondary | ICD-10-CM | POA: Diagnosis not present

## 2020-10-11 DIAGNOSIS — I129 Hypertensive chronic kidney disease with stage 1 through stage 4 chronic kidney disease, or unspecified chronic kidney disease: Secondary | ICD-10-CM | POA: Diagnosis not present

## 2020-10-11 DIAGNOSIS — J449 Chronic obstructive pulmonary disease, unspecified: Secondary | ICD-10-CM | POA: Diagnosis not present

## 2020-10-11 DIAGNOSIS — N1831 Chronic kidney disease, stage 3a: Secondary | ICD-10-CM | POA: Diagnosis not present

## 2020-10-11 DIAGNOSIS — K219 Gastro-esophageal reflux disease without esophagitis: Secondary | ICD-10-CM | POA: Diagnosis not present

## 2020-10-11 DIAGNOSIS — F324 Major depressive disorder, single episode, in partial remission: Secondary | ICD-10-CM | POA: Diagnosis not present

## 2020-10-11 DIAGNOSIS — I10 Essential (primary) hypertension: Secondary | ICD-10-CM | POA: Diagnosis not present

## 2020-10-11 DIAGNOSIS — E7800 Pure hypercholesterolemia, unspecified: Secondary | ICD-10-CM | POA: Diagnosis not present

## 2020-11-08 DIAGNOSIS — I10 Essential (primary) hypertension: Secondary | ICD-10-CM | POA: Diagnosis not present

## 2020-11-11 DIAGNOSIS — E785 Hyperlipidemia, unspecified: Secondary | ICD-10-CM | POA: Diagnosis not present

## 2020-11-11 DIAGNOSIS — I129 Hypertensive chronic kidney disease with stage 1 through stage 4 chronic kidney disease, or unspecified chronic kidney disease: Secondary | ICD-10-CM | POA: Diagnosis not present

## 2020-11-11 DIAGNOSIS — I10 Essential (primary) hypertension: Secondary | ICD-10-CM | POA: Diagnosis not present

## 2020-11-11 DIAGNOSIS — K219 Gastro-esophageal reflux disease without esophagitis: Secondary | ICD-10-CM | POA: Diagnosis not present

## 2020-11-11 DIAGNOSIS — F3181 Bipolar II disorder: Secondary | ICD-10-CM | POA: Diagnosis not present

## 2020-11-11 DIAGNOSIS — J449 Chronic obstructive pulmonary disease, unspecified: Secondary | ICD-10-CM | POA: Diagnosis not present

## 2020-11-11 DIAGNOSIS — F329 Major depressive disorder, single episode, unspecified: Secondary | ICD-10-CM | POA: Diagnosis not present

## 2020-11-11 DIAGNOSIS — G300 Alzheimer's disease with early onset: Secondary | ICD-10-CM | POA: Diagnosis not present

## 2020-11-11 DIAGNOSIS — N1831 Chronic kidney disease, stage 3a: Secondary | ICD-10-CM | POA: Diagnosis not present

## 2020-11-19 DIAGNOSIS — M542 Cervicalgia: Secondary | ICD-10-CM | POA: Diagnosis not present

## 2020-11-19 DIAGNOSIS — M545 Low back pain, unspecified: Secondary | ICD-10-CM | POA: Diagnosis not present

## 2020-11-19 DIAGNOSIS — M791 Myalgia, unspecified site: Secondary | ICD-10-CM | POA: Diagnosis not present

## 2020-12-03 DIAGNOSIS — F3181 Bipolar II disorder: Secondary | ICD-10-CM | POA: Diagnosis not present

## 2020-12-03 DIAGNOSIS — I129 Hypertensive chronic kidney disease with stage 1 through stage 4 chronic kidney disease, or unspecified chronic kidney disease: Secondary | ICD-10-CM | POA: Diagnosis not present

## 2020-12-03 DIAGNOSIS — G300 Alzheimer's disease with early onset: Secondary | ICD-10-CM | POA: Diagnosis not present

## 2020-12-03 DIAGNOSIS — K219 Gastro-esophageal reflux disease without esophagitis: Secondary | ICD-10-CM | POA: Diagnosis not present

## 2020-12-03 DIAGNOSIS — N529 Male erectile dysfunction, unspecified: Secondary | ICD-10-CM | POA: Diagnosis not present

## 2020-12-03 DIAGNOSIS — H5440 Blindness, one eye, unspecified eye: Secondary | ICD-10-CM | POA: Diagnosis not present

## 2020-12-03 DIAGNOSIS — Z23 Encounter for immunization: Secondary | ICD-10-CM | POA: Diagnosis not present

## 2020-12-03 DIAGNOSIS — E7800 Pure hypercholesterolemia, unspecified: Secondary | ICD-10-CM | POA: Diagnosis not present

## 2020-12-03 DIAGNOSIS — J449 Chronic obstructive pulmonary disease, unspecified: Secondary | ICD-10-CM | POA: Diagnosis not present

## 2020-12-10 DIAGNOSIS — I10 Essential (primary) hypertension: Secondary | ICD-10-CM | POA: Diagnosis not present

## 2020-12-17 ENCOUNTER — Other Ambulatory Visit: Payer: Self-pay | Admitting: Rehabilitation

## 2020-12-17 DIAGNOSIS — M47896 Other spondylosis, lumbar region: Secondary | ICD-10-CM

## 2020-12-17 DIAGNOSIS — M545 Low back pain, unspecified: Secondary | ICD-10-CM

## 2020-12-24 ENCOUNTER — Other Ambulatory Visit: Payer: Self-pay

## 2020-12-24 ENCOUNTER — Emergency Department (HOSPITAL_COMMUNITY)
Admission: EM | Admit: 2020-12-24 | Discharge: 2020-12-24 | Disposition: A | Discharge status: Home / Self Care | Payer: Medicare HMO | Attending: Emergency Medicine | Admitting: Emergency Medicine

## 2020-12-24 ENCOUNTER — Encounter (HOSPITAL_COMMUNITY): Payer: Self-pay

## 2020-12-24 ENCOUNTER — Emergency Department (HOSPITAL_COMMUNITY): Payer: Medicare HMO

## 2020-12-24 DIAGNOSIS — Z20822 Contact with and (suspected) exposure to covid-19: Secondary | ICD-10-CM | POA: Diagnosis not present

## 2020-12-24 DIAGNOSIS — I493 Ventricular premature depolarization: Secondary | ICD-10-CM

## 2020-12-24 DIAGNOSIS — Z79899 Other long term (current) drug therapy: Secondary | ICD-10-CM | POA: Diagnosis not present

## 2020-12-24 DIAGNOSIS — M25512 Pain in left shoulder: Secondary | ICD-10-CM | POA: Diagnosis not present

## 2020-12-24 DIAGNOSIS — J449 Chronic obstructive pulmonary disease, unspecified: Secondary | ICD-10-CM | POA: Insufficient documentation

## 2020-12-24 DIAGNOSIS — R079 Chest pain, unspecified: Secondary | ICD-10-CM | POA: Diagnosis not present

## 2020-12-24 DIAGNOSIS — R5383 Other fatigue: Secondary | ICD-10-CM | POA: Insufficient documentation

## 2020-12-24 DIAGNOSIS — Z7982 Long term (current) use of aspirin: Secondary | ICD-10-CM | POA: Diagnosis not present

## 2020-12-24 DIAGNOSIS — I10 Essential (primary) hypertension: Secondary | ICD-10-CM | POA: Insufficient documentation

## 2020-12-24 DIAGNOSIS — M25511 Pain in right shoulder: Secondary | ICD-10-CM | POA: Insufficient documentation

## 2020-12-24 DIAGNOSIS — Z87891 Personal history of nicotine dependence: Secondary | ICD-10-CM | POA: Insufficient documentation

## 2020-12-24 DIAGNOSIS — R001 Bradycardia, unspecified: Secondary | ICD-10-CM | POA: Diagnosis not present

## 2020-12-24 DIAGNOSIS — R059 Cough, unspecified: Secondary | ICD-10-CM | POA: Diagnosis not present

## 2020-12-24 LAB — URINALYSIS, ROUTINE W REFLEX MICROSCOPIC
Bilirubin Urine: NEGATIVE
Glucose, UA: NEGATIVE mg/dL
Hgb urine dipstick: NEGATIVE
Ketones, ur: NEGATIVE mg/dL
Leukocytes,Ua: NEGATIVE
Nitrite: NEGATIVE
Protein, ur: NEGATIVE mg/dL
Specific Gravity, Urine: 1.009 (ref 1.005–1.030)
pH: 7 (ref 5.0–8.0)

## 2020-12-24 LAB — TSH: TSH: 1.408 u[IU]/mL (ref 0.350–4.500)

## 2020-12-24 LAB — CBC
HCT: 41.3 % (ref 39.0–52.0)
Hemoglobin: 13.8 g/dL (ref 13.0–17.0)
MCH: 32.2 pg (ref 26.0–34.0)
MCHC: 33.4 g/dL (ref 30.0–36.0)
MCV: 96.3 fL (ref 80.0–100.0)
Platelets: 180 10*3/uL (ref 150–400)
RBC: 4.29 MIL/uL (ref 4.22–5.81)
RDW: 13.4 % (ref 11.5–15.5)
WBC: 6.9 10*3/uL (ref 4.0–10.5)
nRBC: 0 % (ref 0.0–0.2)

## 2020-12-24 LAB — BASIC METABOLIC PANEL
Anion gap: 14 (ref 5–15)
BUN: 33 mg/dL — ABNORMAL HIGH (ref 8–23)
CO2: 24 mmol/L (ref 22–32)
Calcium: 9.4 mg/dL (ref 8.9–10.3)
Chloride: 99 mmol/L (ref 98–111)
Creatinine, Ser: 1.83 mg/dL — ABNORMAL HIGH (ref 0.61–1.24)
GFR, Estimated: 39 mL/min — ABNORMAL LOW (ref >60)
Glucose, Bld: 111 mg/dL — ABNORMAL HIGH (ref 70–99)
Potassium: 4 mmol/L (ref 3.5–5.1)
Sodium: 137 mmol/L (ref 135–145)

## 2020-12-24 LAB — RESP PANEL BY RT-PCR (FLU A&B, COVID) ARPGX2
Influenza A by PCR: NEGATIVE
Influenza B by PCR: NEGATIVE
SARS Coronavirus 2 by RT PCR: NEGATIVE

## 2020-12-24 LAB — TROPONIN I (HIGH SENSITIVITY)
Troponin I (High Sensitivity): 5 ng/L (ref <18)
Troponin I (High Sensitivity): 5 ng/L (ref <18)

## 2020-12-24 LAB — CBG MONITORING, ED: Glucose-Capillary: 91 mg/dL (ref 70–99)

## 2020-12-24 LAB — MAGNESIUM: Magnesium: 1.5 mg/dL — ABNORMAL LOW (ref 1.7–2.4)

## 2020-12-24 MED ORDER — MAGNESIUM OXIDE -MG SUPPLEMENT 400 (240 MG) MG PO TABS
400.0000 mg | ORAL_TABLET | Freq: Once | ORAL | Status: AC
  Administered 2020-12-24: 400 mg via ORAL
  Filled 2020-12-24: qty 1

## 2020-12-24 NOTE — ED Provider Notes (Addendum)
[facility] EMERGENCY DEPARTMENT Provider Note   CSN: [id number] Arrival date & time: 12/24/20  0930     History Chief Complaint  Patient presents with   Bradycardia    Jay [name] is a 70 y.o. male.  HPI  70 year old male with past medical history of COPD, HTN, chronic back pain, GERD presents emergency department concern for weakness and bradycardia.  Patient states for the past 9 months to a year he has been feeling increasingly fatigued.  At times he feels short of breath specifically with exertion.  He has had an intermittently productive cough without any acute change.  No fever or swelling of his lower extremities.  Patient has no known cardiac history.  Today when he was feeling increasingly fatigued today used his home blood pressure monitor to check his vitals and noted that his heart rate was in the 40s so they called an ambulance.  Patient today has been having some tightness in his bilateral shoulders but denies any chest or back pain.  He has had decreased fatigue but no vomiting or diarrhea.  No acute change in medications.  Past Medical History:  Diagnosis Date   Anxiety    Arthritis    COPD (chronic obstructive pulmonary disease) (HCC)    Depression    GERD (gastroesophageal reflux disease)    Hypertension    Shortness of breath    with excertion    There are no problems to display for this patient.   Past Surgical History:  Procedure Laterality Date   APPENDECTOMY     EYE SURGERY         Family History  Problem Relation Age of Onset   Hypertension Mother    Cancer Father    Hypertension Father    Hypertension Sister    Cancer Brother    Hypertension Brother     Social History   Tobacco Use   Smoking status: Former   Smokeless tobacco: Never  Substance Use Topics   Alcohol use: No   Drug use: No    Home Medications Prior to Admission medications   Medication Sig Start Date End Date Taking? Authorizing Provider  acetaminophen  (TYLENOL) 500 MG tablet Take 1,000 mg by mouth every 6 (six) hours as needed for moderate pain.   Yes [provider]  albuterol (VENTOLIN HFA) 108 (90 Base) MCG/ACT inhaler Inhale 2 puffs into the lungs every 6 (six) hours as needed for wheezing or shortness of breath.   Yes [provider]  amLODipine (NORVASC) 5 MG tablet Take 5 mg by mouth every morning. 12/17/20  Yes [provider]  aspirin 81 MG tablet Take 81 mg by mouth daily.   Yes [provider]  atorvastatin (LIPITOR) 20 MG tablet Take 20 mg by mouth daily.   Yes [provider]  cyclobenzaprine (FLEXERIL) 10 MG tablet Take 5-10 mg by mouth at bedtime as needed. 12/03/20  Yes [provider]  donepezil (ARICEPT) 10 MG tablet Take 10 mg by mouth at bedtime. 11/28/20  Yes [provider]  escitalopram (LEXAPRO) 20 MG tablet Take 20 mg by mouth 2 (two) times daily.   Yes [provider]  esomeprazole (NEXIUM) 40 MG capsule Take 40 mg by mouth daily before breakfast.   Yes [provider]  famotidine (PEPCID) 20 MG tablet Take 20 mg by mouth at bedtime. 11/28/20  Yes [provider]  lamoTRIgine (LAMICTAL) 200 MG tablet Take 200 mg by mouth daily.   Yes [provider]  lisinopril-hydrochlorothiazide (ZESTORETIC) 20-12.5 MG tablet Take 1 tablet by mouth every morning. 12/17/20  Yes [provider]  traMADol (ULTRAM) 50 MG tablet Take 50-100 mg by mouth every 6 (six) hours as needed for moderate pain.   Yes [provider]  predniSONE (STERAPRED UNI-PAK 21 TAB) 5 MG (21) TBPK tablet Take 6 pills first day; 5 pills second day; 4 pills third day; 3 pills fourth day; 2 pills next day and 1 pill last day. Patient not taking: Reported on 12/24/2020 08/27/16   Sanjuana Kava, MD    Allergies    Patient has no known allergies.  Review of Systems   Review of Systems  Constitutional:  Positive for fatigue. Negative for chills and  fever.  HENT:  Negative for congestion.   Eyes:  Negative for visual disturbance.  Respiratory:  Negative for chest tightness and shortness of breath.   Cardiovascular:  Negative for chest pain, palpitations and leg swelling.  Gastrointestinal:  Negative for abdominal pain, diarrhea and vomiting.  Genitourinary:  Negative for dysuria.  Musculoskeletal:  Positive for back pain. Negative for neck pain.  Skin:  Negative for rash.  Neurological:  Negative for syncope and headaches.   Physical Exam Updated Vital Signs BP 116/89    Pulse (!) 110    Resp 13    Ht 5\' 9"  (1.753 m)    Wt 90.7 kg    SpO2 (!) 83%    BMI 29.53 kg/m   Physical Exam Vitals and nursing note reviewed.  Constitutional:      General: He is not in acute distress.    Appearance: Normal appearance. He is not diaphoretic.  HENT:     Head: Normocephalic.     Mouth/Throat:     Mouth: Mucous membranes are moist.  Eyes:     Comments: chronic left eye blindness  Cardiovascular:     Rate and Rhythm: Normal rate.  Pulmonary:     Effort: Pulmonary effort is normal. No respiratory distress.  Abdominal:     Palpations: Abdomen is soft.     Tenderness: There is no abdominal tenderness.  Musculoskeletal:        General: No swelling.  Skin:    General: Skin is warm.  Neurological:     Mental Status: He is alert and oriented to person, place, and time. Mental status is at baseline.  Psychiatric:        Mood and Affect: Mood normal.    ED Results / Procedures / Treatments   Labs (all labs ordered are listed, but only abnormal results are displayed) Labs Reviewed  BASIC METABOLIC PANEL - Abnormal; Notable for the following components:      Result Value   Glucose, Bld 111 (*)    BUN 33 (*)    Creatinine, Ser 1.83 (*)    GFR, Estimated 39 (*)    All other components within normal limits  MAGNESIUM - Abnormal; Notable for the following components:   Magnesium 1.5 (*)    All other components within normal limits  RESP  PANEL BY RT-PCR (FLU A&B, COVID) ARPGX2  CBC  TSH  URINALYSIS, ROUTINE W REFLEX MICROSCOPIC  CBG MONITORING, ED  TROPONIN I (HIGH SENSITIVITY)  TROPONIN I (HIGH SENSITIVITY)    EKG EKG Interpretation  Date/Time:  Tuesday December 24 2020 09:50:16 EST Ventricular Rate:  98 PR Interval:    QRS Duration: 90 QT Interval:  348 QTC Calculation: 409 R Axis:   60 Text Interpretation: Atrial fibrillation  Ventricular bigeminy Abnormal R-wave progression, early transition Nonspecific T abnormalities, lateral leads NSR with frequent PVCs Confirmed by Lavenia Atlas [phone]) on 12/24/2020 10:36:14 AM  Radiology DG Chest Port 1 View  Result Date: 12/24/2020 CLINICAL DATA:  Chest pain EXAM: PORTABLE CHEST 1 VIEW COMPARISON:  Images of previous study done on 10/30/2002 are not available for comparison FINDINGS: Cardiac size is within normal limits. Central pulmonary vessels are unremarkable. There are linear densities in the lower lung fields, more so on the left side. There is no focal consolidation. There is no pleural effusion or pneumothorax. IMPRESSION: There are no signs of pulmonary edema or focal pulmonary consolidation. Linear densities in the lower lung fields may suggest crowding of normal bronchovascular structures or subsegmental atelectasis. Electronically Signed   By: Elmer Picker M.D.   On: 12/24/2020 10:38    Procedures Procedures   Medications Ordered in ED Medications - No data to display  ED Course  I have reviewed the triage vital signs and the nursing notes.  Pertinent labs & imaging results that were available during my care of the patient were reviewed by me and considered in my medical decision making (see chart for details).    MDM Rules/Calculators/A&P                           70 year old male presents emergency department concern for weakness for almost a year and possible bradycardia noted on his home blood pressure monitor.  On arrival patient's heart  rate is normal, blood pressure stable.  EKG shows normal sinus rhythm with frequent PVCs but not in a pattern.  No active chest pain.  He appears stable.  I believe the patient has had a normal heart rate and that the machine incorrectly calculated his heart rate due to the PVCs.  Patient's main complaint today is chronic fatigue.  He does not seem to have any acute symptoms in regards to the PVCs.  No active chest pain, shortness of breath, findings of CHF.  Blood work is reassuring, troponin is negative, TSH is normal, kidney function is slightly elevated, mild dehydration and magnesium is 1.5.  Patient was able to drink fluids without difficulty, demonstrated that he can orally rehydrate.  He was given a dose of magnesium here in the department.  On telemetry he has intermittent frequent PVCs but otherwise remains in normal sinus rhythm.  No other arrhythmia.  Patient has no shortness of breath or ongoing chest pain.  Low suspicion for ACS/PE.  He is otherwise well-appearing.  Will follow-up with cardiology as an outpatient.  Will increase oral hydration over the next couple days and follow-up with primary doctor for repeat kidney and magnesium.  Patient's otherwise been ambulatory and baseline.  Patient at this time appears safe and stable for discharge and will be treated as an outpatient.  Discharge plan and strict return to ED precautions discussed, patient verbalizes understanding and agreement.    Final Clinical Impression(s) / ED Diagnoses Final diagnoses:  Chest pain    Rx / DC Orders ED Discharge Orders     None        Lorelle Gibbs, DO 12/24/20 1609    Shalamar Crays, Alvin Critchley, DO 12/24/20 1611

## 2020-12-24 NOTE — ED Triage Notes (Signed)
Patient complaining of ongoing weakness and low heart rate. This morning heart rate in the low 40s.

## 2020-12-24 NOTE — Discharge Instructions (Addendum)
You have been seen and discharged from the emergency department.  Cardiac work-up was normal.  You had some slight dehydration and low magnesium.  You are given oral magnesium in the department for replacement.  Establish care with cardiology for further evaluation.  Push oral hydration.  Follow-up with your primary provider for repeat blood work specifically kidney function and magnesium level, reevaluation and further care. Take home medications as prescribed. If you have any worsening symptoms or further concerns for your health please return to an emergency department for further evaluation.

## 2020-12-26 ENCOUNTER — Encounter: Payer: Self-pay | Admitting: Cardiology

## 2020-12-26 ENCOUNTER — Ambulatory Visit: Payer: Medicare HMO | Admitting: Cardiology

## 2020-12-26 ENCOUNTER — Encounter: Payer: Self-pay | Admitting: *Deleted

## 2020-12-26 ENCOUNTER — Other Ambulatory Visit: Payer: Self-pay | Admitting: Cardiology

## 2020-12-26 ENCOUNTER — Ambulatory Visit (INDEPENDENT_AMBULATORY_CARE_PROVIDER_SITE_OTHER): Payer: Medicare HMO

## 2020-12-26 VITALS — BP 106/84 | HR 76 | Ht 69.0 in | Wt 193.0 lb

## 2020-12-26 DIAGNOSIS — R002 Palpitations: Secondary | ICD-10-CM

## 2020-12-26 DIAGNOSIS — R0602 Shortness of breath: Secondary | ICD-10-CM

## 2020-12-26 DIAGNOSIS — I493 Ventricular premature depolarization: Secondary | ICD-10-CM

## 2020-12-26 NOTE — Progress Notes (Signed)
Clinical Summary Mr. Riojas is a 70 y.o.male seen today as a new patient for the following medical problems.   1.Fatigue - ER visit 12/24/20 with generalized weakness and reported bradycardia - home HRs noted to be in the 40s. EKG SR with PVCs - K 4, Mg 1.5, TSH 1.4   - no palpitations - no chest pains. Can have left shoulder pains, can last hours to a day at time.  - can have some SOB at times - working to wean caffeine. Previously he was drinking monster drinks x 1 per day, was drinking 2-3 cups of coffee, occasonal sodas, no tea, no EtOH.    2. COPD - SOB with activities, for example taking dogs out causes - +cough, +wheezing. Not better with albuterol.  3. OSA screen +daytime somnolence, unsure if snores - he reports prior sleep study few years ago was negatbve Past Medical History:  Diagnosis Date   Anxiety    Arthritis    COPD (chronic obstructive pulmonary disease) (HCC)    Depression    GERD (gastroesophageal reflux disease)    Hypertension    Shortness of breath    with excertion     No Known Allergies   Current Outpatient Medications  Medication Sig Dispense Refill   acetaminophen (TYLENOL) 500 MG tablet Take 1,000 mg by mouth every 6 (six) hours as needed for moderate pain.     albuterol (VENTOLIN HFA) 108 (90 Base) MCG/ACT inhaler Inhale 2 puffs into the lungs every 6 (six) hours as needed for wheezing or shortness of breath.     amLODipine (NORVASC) 5 MG tablet Take 5 mg by mouth every morning.     aspirin 81 MG tablet Take 81 mg by mouth daily.     atorvastatin (LIPITOR) 20 MG tablet Take 20 mg by mouth daily.     cyclobenzaprine (FLEXERIL) 10 MG tablet Take 5-10 mg by mouth at bedtime as needed.     donepezil (ARICEPT) 10 MG tablet Take 10 mg by mouth at bedtime.     escitalopram (LEXAPRO) 20 MG tablet Take 20 mg by mouth 2 (two) times daily.     esomeprazole (NEXIUM) 40 MG capsule Take 40 mg by mouth daily before breakfast.     famotidine  (PEPCID) 20 MG tablet Take 20 mg by mouth at bedtime.     lamoTRIgine (LAMICTAL) 200 MG tablet Take 200 mg by mouth daily.     lisinopril-hydrochlorothiazide (ZESTORETIC) 20-12.5 MG tablet Take 1 tablet by mouth every morning.     predniSONE (STERAPRED UNI-PAK 21 TAB) 5 MG (21) TBPK tablet Take 6 pills first day; 5 pills second day; 4 pills third day; 3 pills fourth day; 2 pills next day and 1 pill last day. (Patient not taking: Reported on 12/24/2020) 21 tablet 0   traMADol (ULTRAM) 50 MG tablet Take 50-100 mg by mouth every 6 (six) hours as needed for moderate pain.     No current facility-administered medications for this visit.     Past Surgical History:  Procedure Laterality Date   APPENDECTOMY     EYE SURGERY       No Known Allergies    Family History  Problem Relation Age of Onset   Hypertension Mother    Cancer Father    Hypertension Father    Hypertension Sister    Cancer Brother    Hypertension Brother      Social History Mr. Hayashi reports that he has quit smoking. He has never  used smokeless tobacco. Mr. Virani reports no history of alcohol use.   Review of Systems CONSTITUTIONAL: No weight loss, fever, chills, weakness or fatigue.  HEENT: Eyes: No visual loss, blurred vision, double vision or yellow sclerae.No hearing loss, sneezing, congestion, runny nose or sore throat.  SKIN: No rash or itching.  CARDIOVASCULAR: per hpi RESPIRATORY: No shortness of breath, cough or sputum.  GASTROINTESTINAL: No anorexia, nausea, vomiting or diarrhea. No abdominal pain or blood.  GENITOURINARY: No burning on urination, no polyuria NEUROLOGICAL: No headache, dizziness, syncope, paralysis, ataxia, numbness or tingling in the extremities. No change in bowel or bladder control.  MUSCULOSKELETAL: No muscle, back pain, joint pain or stiffness.  LYMPHATICS: No enlarged nodes. No history of splenectomy.  PSYCHIATRIC: No history of depression or anxiety.  ENDOCRINOLOGIC: No  reports of sweating, cold or heat intolerance. No polyuria or polydipsia.  Marland Kitchen   Physical Examination Today's Vitals   12/26/20 1013  BP: 106/84  Pulse: 76  SpO2: 98%  Weight: 193 lb (87.5 kg)  Height: 5\' 9"  (1.753 m)   Body mass index is 28.5 kg/m.  Gen: resting comfortably, no acute distress HEENT: no scleral icterus, pupils equal round and reactive, no palptable cervical adenopathy,  CV: RRR, no m/r/g no jvd Resp: Clear to auscultation bilaterally GI: abdomen is soft, non-tender, non-distended, normal bowel sounds, no hepatosplenomegaly MSK: extremities are warm, no edema.  Skin: warm, no rash Neuro:  no focal deficits Psych: appropriate affect     Assessment and Plan   1.PVCs - will obtain zio monitor to quantify PVC burden and if any significnat runs of NSVT - depending on degree of ventricular ectopy may warrant further evaluation with echo, possible stress testing.  - he is weaning caffeine - EKG today shows SR, PVCs  2. SOB - chronic SOB/DOE he attributes to his COPD, uncler if could be underlying cardiac component - possible addtiional cardiac testing pending monitor results.        Arnoldo Lenis, M.D.

## 2020-12-26 NOTE — Addendum Note (Signed)
Addended by: Merlene Laughter on: 12/26/2020 04:34 PM   Modules accepted: Orders

## 2020-12-26 NOTE — Patient Instructions (Signed)
Medication Instructions:  Continue all current medications.  Labwork: none  Testing/Procedures: Your physician has recommended that you wear a 3 day event monitor. Event monitors are medical devices that record the hearts electrical activity. Doctors most often [place] these monitors to diagnose arrhythmias. Arrhythmias are problems with the speed or rhythm of the heartbeat. The monitor is a small, portable device. You can wear one while you do your normal daily activities. This is usually used to diagnose what is causing palpitations/syncope (passing out). Office will contact with results via phone or letter.     Follow-Up: Pending test results   Any Other Special Instructions Will Be Listed Below (If Applicable).   If you need a refill on your cardiac medications before your next appointment, please call your pharmacy.

## 2020-12-29 DIAGNOSIS — R002 Palpitations: Secondary | ICD-10-CM | POA: Diagnosis not present

## 2021-01-01 DIAGNOSIS — N1831 Chronic kidney disease, stage 3a: Secondary | ICD-10-CM | POA: Diagnosis not present

## 2021-01-01 DIAGNOSIS — R6882 Decreased libido: Secondary | ICD-10-CM | POA: Diagnosis not present

## 2021-01-01 DIAGNOSIS — R5383 Other fatigue: Secondary | ICD-10-CM | POA: Diagnosis not present

## 2021-01-06 ENCOUNTER — Ambulatory Visit
Admission: RE | Admit: 2021-01-06 | Discharge: 2021-01-06 | Disposition: A | Discharge status: Home / Self Care | Payer: Medicare HMO | Source: Ambulatory Visit | Attending: Rehabilitation | Admitting: Rehabilitation

## 2021-01-06 ENCOUNTER — Other Ambulatory Visit: Payer: Self-pay

## 2021-01-06 DIAGNOSIS — M5136 Other intervertebral disc degeneration, lumbar region: Secondary | ICD-10-CM | POA: Diagnosis not present

## 2021-01-06 DIAGNOSIS — M545 Low back pain, unspecified: Secondary | ICD-10-CM

## 2021-01-06 DIAGNOSIS — M47896 Other spondylosis, lumbar region: Secondary | ICD-10-CM

## 2021-01-06 DIAGNOSIS — R200 Anesthesia of skin: Secondary | ICD-10-CM | POA: Diagnosis not present

## 2021-01-08 DIAGNOSIS — R002 Palpitations: Secondary | ICD-10-CM | POA: Diagnosis not present

## 2021-01-10 DIAGNOSIS — K219 Gastro-esophageal reflux disease without esophagitis: Secondary | ICD-10-CM | POA: Diagnosis not present

## 2021-01-10 DIAGNOSIS — E785 Hyperlipidemia, unspecified: Secondary | ICD-10-CM | POA: Diagnosis not present

## 2021-01-10 DIAGNOSIS — F329 Major depressive disorder, single episode, unspecified: Secondary | ICD-10-CM | POA: Diagnosis not present

## 2021-01-10 DIAGNOSIS — I129 Hypertensive chronic kidney disease with stage 1 through stage 4 chronic kidney disease, or unspecified chronic kidney disease: Secondary | ICD-10-CM | POA: Diagnosis not present

## 2021-01-10 DIAGNOSIS — I10 Essential (primary) hypertension: Secondary | ICD-10-CM | POA: Diagnosis not present

## 2021-01-10 DIAGNOSIS — N1831 Chronic kidney disease, stage 3a: Secondary | ICD-10-CM | POA: Diagnosis not present

## 2021-01-10 DIAGNOSIS — E7800 Pure hypercholesterolemia, unspecified: Secondary | ICD-10-CM | POA: Diagnosis not present

## 2021-01-10 DIAGNOSIS — F3181 Bipolar II disorder: Secondary | ICD-10-CM | POA: Diagnosis not present

## 2021-01-10 DIAGNOSIS — J449 Chronic obstructive pulmonary disease, unspecified: Secondary | ICD-10-CM | POA: Diagnosis not present

## 2021-01-14 DIAGNOSIS — M545 Low back pain, unspecified: Secondary | ICD-10-CM | POA: Diagnosis not present

## 2021-01-14 DIAGNOSIS — Z79891 Long term (current) use of opiate analgesic: Secondary | ICD-10-CM | POA: Diagnosis not present

## 2021-01-14 DIAGNOSIS — M791 Myalgia, unspecified site: Secondary | ICD-10-CM | POA: Diagnosis not present

## 2021-01-14 DIAGNOSIS — Z79899 Other long term (current) drug therapy: Secondary | ICD-10-CM | POA: Diagnosis not present

## 2021-01-14 DIAGNOSIS — M542 Cervicalgia: Secondary | ICD-10-CM | POA: Diagnosis not present

## 2021-01-14 DIAGNOSIS — G894 Chronic pain syndrome: Secondary | ICD-10-CM | POA: Diagnosis not present

## 2021-01-15 ENCOUNTER — Other Ambulatory Visit: Payer: Self-pay | Admitting: Orthopaedic Surgery

## 2021-01-15 ENCOUNTER — Telehealth: Payer: Self-pay | Admitting: Cardiology

## 2021-01-15 DIAGNOSIS — M542 Cervicalgia: Secondary | ICD-10-CM

## 2021-01-15 NOTE — Telephone Encounter (Signed)
Patient's wife called for results to his monitor

## 2021-01-17 NOTE — Telephone Encounter (Signed)
I will forward to Dr.Branch to read, daughter aware.

## 2021-01-17 NOTE — Telephone Encounter (Signed)
Follow Up:    Daughter is calling to see if pt's Monitor results are ready?

## 2021-01-21 DIAGNOSIS — N1831 Chronic kidney disease, stage 3a: Secondary | ICD-10-CM | POA: Diagnosis not present

## 2021-01-21 NOTE — Telephone Encounter (Signed)
Just resulted to Renea Ee MD

## 2021-01-22 ENCOUNTER — Telehealth: Payer: Self-pay

## 2021-01-22 DIAGNOSIS — I493 Ventricular premature depolarization: Secondary | ICD-10-CM

## 2021-01-22 NOTE — Telephone Encounter (Signed)
Patient notified. Pt had no questions or concerns at this time. Echo ordered.

## 2021-01-22 NOTE — Telephone Encounter (Signed)
-----   Message from Arnoldo Lenis, MD sent at 01/21/2021  3:39 PM EST ----- Heart monitor does show some frequent extra heart beats called PVCs. These sometimes just occur but also sometimes can be a sign of undelrying heart disease. Can we obtain an echo for PVCs to look for any weakness of the heart muscle that could be playing a role  Zandra Abts MD

## 2021-01-27 DIAGNOSIS — I10 Essential (primary) hypertension: Secondary | ICD-10-CM | POA: Diagnosis not present

## 2021-01-27 DIAGNOSIS — F3181 Bipolar II disorder: Secondary | ICD-10-CM | POA: Diagnosis not present

## 2021-01-27 DIAGNOSIS — E7800 Pure hypercholesterolemia, unspecified: Secondary | ICD-10-CM | POA: Diagnosis not present

## 2021-01-27 DIAGNOSIS — G300 Alzheimer's disease with early onset: Secondary | ICD-10-CM | POA: Diagnosis not present

## 2021-01-27 DIAGNOSIS — I129 Hypertensive chronic kidney disease with stage 1 through stage 4 chronic kidney disease, or unspecified chronic kidney disease: Secondary | ICD-10-CM | POA: Diagnosis not present

## 2021-01-27 DIAGNOSIS — N1831 Chronic kidney disease, stage 3a: Secondary | ICD-10-CM | POA: Diagnosis not present

## 2021-01-27 DIAGNOSIS — J449 Chronic obstructive pulmonary disease, unspecified: Secondary | ICD-10-CM | POA: Diagnosis not present

## 2021-01-27 DIAGNOSIS — K219 Gastro-esophageal reflux disease without esophagitis: Secondary | ICD-10-CM | POA: Diagnosis not present

## 2021-02-08 ENCOUNTER — Other Ambulatory Visit: Payer: Medicare HMO

## 2021-02-11 DIAGNOSIS — N1831 Chronic kidney disease, stage 3a: Secondary | ICD-10-CM | POA: Diagnosis not present

## 2021-02-12 DIAGNOSIS — E875 Hyperkalemia: Secondary | ICD-10-CM | POA: Diagnosis not present

## 2021-02-21 ENCOUNTER — Ambulatory Visit (HOSPITAL_COMMUNITY)
Admission: RE | Admit: 2021-02-21 | Discharge: 2021-02-21 | Disposition: A | Discharge status: Home / Self Care | Payer: Medicare HMO | Source: Ambulatory Visit | Attending: Cardiology | Admitting: Cardiology

## 2021-02-21 ENCOUNTER — Other Ambulatory Visit: Payer: Self-pay

## 2021-02-21 DIAGNOSIS — I493 Ventricular premature depolarization: Secondary | ICD-10-CM | POA: Insufficient documentation

## 2021-02-21 DIAGNOSIS — R0602 Shortness of breath: Secondary | ICD-10-CM | POA: Diagnosis not present

## 2021-02-21 DIAGNOSIS — I517 Cardiomegaly: Secondary | ICD-10-CM | POA: Insufficient documentation

## 2021-02-21 LAB — ECHOCARDIOGRAM COMPLETE
AR max vel: 3.07 cm2
AV Area VTI: 2.8 cm2
AV Area mean vel: 2.66 cm2
AV Mean grad: 3 mmHg
AV Peak grad: 7 mmHg
Ao pk vel: 1.32 m/s
Area-P 1/2: 4.21 cm2
Calc EF: 50 %
MV VTI: 3.12 cm2
S' Lateral: 3.3 cm
Single Plane A2C EF: 48.4 %
Single Plane A4C EF: 54.9 %

## 2021-02-21 NOTE — Progress Notes (Signed)
*  PRELIMINARY RESULTS* Echocardiogram 2D Echocardiogram has been performed.  Jay [name] 02/21/2021, 3:06 PM

## 2021-02-26 ENCOUNTER — Telehealth: Payer: Self-pay | Admitting: *Deleted

## 2021-02-26 NOTE — Telephone Encounter (Signed)
Laurine Blazer, LPN  2/98/4730  8:56 PM EST Back to Top    Notified, copy to pcp.    Appointment given.

## 2021-02-26 NOTE — Telephone Encounter (Signed)
-----   Message from Arnoldo Lenis, MD sent at 02/23/2021  9:45 AM EST ----- Echo looks good, just some mild age related stiffness of the heart muscle which is common and a minor findings. F/u 2 months   Zandra Abts MD

## 2021-03-03 ENCOUNTER — Emergency Department (HOSPITAL_COMMUNITY): Payer: Medicare HMO

## 2021-03-03 ENCOUNTER — Observation Stay (HOSPITAL_COMMUNITY)
Admission: EM | Admit: 2021-03-03 | Discharge: 2021-03-04 | Disposition: A | Discharge status: Home / Self Care | Payer: Medicare HMO | Attending: Family Medicine | Admitting: Family Medicine

## 2021-03-03 ENCOUNTER — Encounter (HOSPITAL_COMMUNITY): Payer: Self-pay | Admitting: *Deleted

## 2021-03-03 DIAGNOSIS — S199XXA Unspecified injury of neck, initial encounter: Secondary | ICD-10-CM | POA: Diagnosis not present

## 2021-03-03 DIAGNOSIS — Y93E5 Activity, floor mopping and cleaning: Secondary | ICD-10-CM | POA: Diagnosis not present

## 2021-03-03 DIAGNOSIS — J449 Chronic obstructive pulmonary disease, unspecified: Secondary | ICD-10-CM | POA: Diagnosis present

## 2021-03-03 DIAGNOSIS — Z7982 Long term (current) use of aspirin: Secondary | ICD-10-CM | POA: Diagnosis not present

## 2021-03-03 DIAGNOSIS — S0990XA Unspecified injury of head, initial encounter: Secondary | ICD-10-CM | POA: Diagnosis not present

## 2021-03-03 DIAGNOSIS — F418 Other specified anxiety disorders: Secondary | ICD-10-CM

## 2021-03-03 DIAGNOSIS — S066X0A Traumatic subarachnoid hemorrhage without loss of consciousness, initial encounter: Secondary | ICD-10-CM | POA: Diagnosis not present

## 2021-03-03 DIAGNOSIS — M47812 Spondylosis without myelopathy or radiculopathy, cervical region: Secondary | ICD-10-CM | POA: Diagnosis not present

## 2021-03-03 DIAGNOSIS — H5440 Blindness, one eye, unspecified eye: Secondary | ICD-10-CM | POA: Diagnosis present

## 2021-03-03 DIAGNOSIS — I10 Essential (primary) hypertension: Secondary | ICD-10-CM | POA: Diagnosis not present

## 2021-03-03 DIAGNOSIS — W228XXA Striking against or struck by other objects, initial encounter: Secondary | ICD-10-CM | POA: Diagnosis not present

## 2021-03-03 DIAGNOSIS — Z20822 Contact with and (suspected) exposure to covid-19: Secondary | ICD-10-CM | POA: Insufficient documentation

## 2021-03-03 DIAGNOSIS — Z79899 Other long term (current) drug therapy: Secondary | ICD-10-CM | POA: Diagnosis not present

## 2021-03-03 DIAGNOSIS — S02119A Unspecified fracture of occiput, initial encounter for closed fracture: Principal | ICD-10-CM | POA: Insufficient documentation

## 2021-03-03 DIAGNOSIS — Y9289 Other specified places as the place of occurrence of the external cause: Secondary | ICD-10-CM | POA: Insufficient documentation

## 2021-03-03 DIAGNOSIS — R079 Chest pain, unspecified: Secondary | ICD-10-CM | POA: Diagnosis not present

## 2021-03-03 DIAGNOSIS — S3993XA Unspecified injury of pelvis, initial encounter: Secondary | ICD-10-CM | POA: Diagnosis not present

## 2021-03-03 DIAGNOSIS — R102 Pelvic and perineal pain: Secondary | ICD-10-CM | POA: Diagnosis not present

## 2021-03-03 DIAGNOSIS — R55 Syncope and collapse: Secondary | ICD-10-CM | POA: Diagnosis present

## 2021-03-03 DIAGNOSIS — R270 Ataxia, unspecified: Secondary | ICD-10-CM | POA: Diagnosis not present

## 2021-03-03 DIAGNOSIS — M4802 Spinal stenosis, cervical region: Secondary | ICD-10-CM | POA: Diagnosis not present

## 2021-03-03 DIAGNOSIS — S065X0A Traumatic subdural hemorrhage without loss of consciousness, initial encounter: Secondary | ICD-10-CM | POA: Insufficient documentation

## 2021-03-03 DIAGNOSIS — I629 Nontraumatic intracranial hemorrhage, unspecified: Secondary | ICD-10-CM

## 2021-03-03 DIAGNOSIS — I2510 Atherosclerotic heart disease of native coronary artery without angina pectoris: Secondary | ICD-10-CM | POA: Diagnosis not present

## 2021-03-03 LAB — HEPATIC FUNCTION PANEL
ALT: 24 U/L (ref 0–44)
AST: 27 U/L (ref 15–41)
Albumin: 4.4 g/dL (ref 3.5–5.0)
Alkaline Phosphatase: 53 U/L (ref 38–126)
Bilirubin, Direct: 0.1 mg/dL (ref 0.0–0.2)
Indirect Bilirubin: 0.6 mg/dL (ref 0.3–0.9)
Total Bilirubin: 0.7 mg/dL (ref 0.3–1.2)
Total Protein: 7.3 g/dL (ref 6.5–8.1)

## 2021-03-03 LAB — CBC
HCT: 41.1 % (ref 39.0–52.0)
Hemoglobin: 13.3 g/dL (ref 13.0–17.0)
MCH: 31.9 pg (ref 26.0–34.0)
MCHC: 32.4 g/dL (ref 30.0–36.0)
MCV: 98.6 fL (ref 80.0–100.0)
Platelets: 217 10*3/uL (ref 150–400)
RBC: 4.17 MIL/uL — ABNORMAL LOW (ref 4.22–5.81)
RDW: 13.2 % (ref 11.5–15.5)
WBC: 9.9 10*3/uL (ref 4.0–10.5)
nRBC: 0 % (ref 0.0–0.2)

## 2021-03-03 LAB — BASIC METABOLIC PANEL
Anion gap: 9 (ref 5–15)
BUN: 25 mg/dL — ABNORMAL HIGH (ref 8–23)
CO2: 25 mmol/L (ref 22–32)
Calcium: 9.2 mg/dL (ref 8.9–10.3)
Chloride: 103 mmol/L (ref 98–111)
Creatinine, Ser: 1.34 mg/dL — ABNORMAL HIGH (ref 0.61–1.24)
GFR, Estimated: 57 mL/min — ABNORMAL LOW (ref >60)
Glucose, Bld: 108 mg/dL — ABNORMAL HIGH (ref 70–99)
Potassium: 5 mmol/L (ref 3.5–5.1)
Sodium: 137 mmol/L (ref 135–145)

## 2021-03-03 LAB — URINALYSIS, ROUTINE W REFLEX MICROSCOPIC
Bilirubin Urine: NEGATIVE
Glucose, UA: NEGATIVE mg/dL
Hgb urine dipstick: NEGATIVE
Ketones, ur: NEGATIVE mg/dL
Leukocytes,Ua: NEGATIVE
Nitrite: NEGATIVE
Protein, ur: NEGATIVE mg/dL
Specific Gravity, Urine: 1.017 (ref 1.005–1.030)
pH: 5 (ref 5.0–8.0)

## 2021-03-03 LAB — RESP PANEL BY RT-PCR (FLU A&B, COVID) ARPGX2
Influenza A by PCR: NEGATIVE
Influenza B by PCR: NEGATIVE
SARS Coronavirus 2 by RT PCR: NEGATIVE

## 2021-03-03 LAB — MAGNESIUM: Magnesium: 1.7 mg/dL (ref 1.7–2.4)

## 2021-03-03 LAB — TROPONIN I (HIGH SENSITIVITY)
Troponin I (High Sensitivity): 5 ng/L (ref <18)
Troponin I (High Sensitivity): 5 ng/L (ref <18)

## 2021-03-03 LAB — TSH: TSH: 0.843 u[IU]/mL (ref 0.350–4.500)

## 2021-03-03 LAB — CBG MONITORING, ED: Glucose-Capillary: 102 mg/dL — ABNORMAL HIGH (ref 70–99)

## 2021-03-03 MED ORDER — SODIUM CHLORIDE 0.9 % IV BOLUS
1000.0000 mL | Freq: Once | INTRAVENOUS | Status: AC
  Administered 2021-03-03: 1000 mL via INTRAVENOUS

## 2021-03-03 MED ORDER — MORPHINE SULFATE (PF) 2 MG/ML IV SOLN
2.0000 mg | Freq: Once | INTRAVENOUS | Status: AC
  Administered 2021-03-03: 2 mg via INTRAVENOUS
  Filled 2021-03-03: qty 1

## 2021-03-03 NOTE — Assessment & Plan Note (Signed)
Resume Lexapro, as needed Xanax 0.5 mg twice daily

## 2021-03-03 NOTE — Assessment & Plan Note (Signed)
Stable..  Quit smoking years ago. -Resume home albuterol inhaler as needed

## 2021-03-03 NOTE — H&P (Addendum)
History and Physical    JASHAWN FLOYD PPI:[id number] DOB: July 24, 1950 DOA: 03/03/2021  PCP: Maury Dus, MD   Patient coming from: Home  I have personally briefly reviewed patient's old medical records in [place]  Chief Complaint: Syncope and fall  HPI: LINDA BIEHN is a 71 y.o. male with medical history significant for COPD, arthritis, depression and anxiety, hypertension. Patient was brought to the ED with reports of passing out.  Patient was cleaning out his garage, and the last thing he remembers was walking towards the table to clean it out.  He does not remember having any prior symptoms, he tells me woke up this morning feeling very well, this happened about 11:00 in the morning he had not eaten breakfast but this is not unusual for him.  He was out for maybe about 15 minutes when spouse checked on him and found patient on the floor in the garage.  He reports occasional limiting dizziness that occurs initially on standing up that resolves, but he did not have that today.  No chest pain.  No difficulty breathing.  No leg swelling.  No palpitations.  No prior symptoms.  No history of falls.  He has maintained stable oral intake, no vomiting, no loose stools.  ED Course: Blood pressure 120s to 140s.  Stable vitals.  Troponin 5 > 5.  Head CT shows nondisplaced fracture of the right occipital bone extending to the right skull base Small focal acute parenchymal and or traumatic subarachnoid hemorrhage left frontal lobe Small acute subdural hemorrhage left frontal to temporal lobe Small foci of acute subdural hemorrhage overlying tentorium and inferior margin of anterior falx  Cervical CT without acute abnormality.  EDP talked to neurosurgeon on-call, Dr. Annette Stable, recommended hospitalist admission and repeat imaging in the morning, patient is on aspirin.  Review of Systems: As per HPI all other systems reviewed and negative.  Past Medical History:  Diagnosis Date   Anxiety     Arthritis    COPD (chronic obstructive pulmonary disease) (HCC)    Depression    GERD (gastroesophageal reflux disease)    Hypertension    Shortness of breath    with excertion    Past Surgical History:  Procedure Laterality Date   APPENDECTOMY     EYE SURGERY       reports that he has quit smoking. He has never used smokeless tobacco. He reports that he does not drink alcohol and does not use drugs.  No Known Allergies  Family History  Problem Relation Age of Onset   Hypertension Mother    Cancer Father    Hypertension Father    Hypertension Sister    Cancer Brother    Hypertension Brother     Prior to Admission medications   Medication Sig Start Date End Date Taking? Authorizing Provider  acetaminophen (TYLENOL) 500 MG tablet Take 1,000 mg by mouth every 6 (six) hours as needed for moderate pain.   Yes [provider]  albuterol (VENTOLIN HFA) 108 (90 Base) MCG/ACT inhaler Inhale 2 puffs into the lungs every 6 (six) hours as needed for wheezing or shortness of breath.   Yes [provider]  ALPRAZolam Duanne Moron) 0.5 MG tablet Take 0.5 mg by mouth as needed. 12/31/20  Yes [provider]  amLODipine (NORVASC) 5 MG tablet Take 5 mg by mouth every morning. 12/17/20  Yes [provider]  aspirin 81 MG tablet Take 81 mg by mouth daily.   Yes [provider]  atorvastatin (LIPITOR) 20 MG tablet Take 20 mg by mouth daily.   Yes [provider]  cyclobenzaprine (FLEXERIL) 10 MG tablet Take 5-10 mg by mouth at bedtime as needed. 12/03/20  Yes [provider]  donepezil (ARICEPT) 10 MG tablet Take 10 mg by mouth at bedtime. 11/28/20  Yes [provider]  escitalopram (LEXAPRO) 20 MG tablet Take 20 mg by mouth 2 (two) times daily.   Yes [provider]  esomeprazole (NEXIUM) 40 MG capsule Take 40 mg by mouth daily before breakfast.   Yes [provider]  famotidine (PEPCID) 20 MG tablet Take 20 mg  by mouth at bedtime. 11/28/20  Yes [provider]  HYDROcodone-acetaminophen (NORCO/VICODIN) 5-325 MG tablet Take 0.5-1 tablets by mouth every 6 (six) hours as needed. 01/15/21  Yes [provider]  lamoTRIgine (LAMICTAL) 200 MG tablet Take 200 mg by mouth daily.   Yes [provider]  lisinopril-hydrochlorothiazide (ZESTORETIC) 20-12.5 MG tablet Take 1 tablet by mouth every morning. 12/17/20  Yes [provider]  Multiple Vitamin (MULTIVITAMIN) tablet Take 1 tablet by mouth daily.   Yes [provider]  [place] [facility] wellness gummies   Yes [provider]  vitamin B-12 (CYANOCOBALAMIN) 500 MCG tablet Take 500 mcg by mouth daily.   Yes [provider]  [facility] injection  12/03/20   [provider]    Physical Exam: Vitals:   03/03/21 1900 03/03/21 1930 03/03/21 2000 03/03/21 2100  BP: (!) 130/55 (!) 141/65 134/71 136/74  Pulse: 81 83 85 79  Resp: 16 (!) 26 18 18   Temp:      TempSrc:      SpO2: 98% 96% 97% 96%    Constitutional: NAD, calm, comfortable Vitals:   03/03/21 1900 03/03/21 1930 03/03/21 2000 03/03/21 2100  BP: (!) 130/55 (!) 141/65 134/71 136/74  Pulse: 81 83 85 79  Resp: 16 (!) 26 18 18   Temp:      TempSrc:      SpO2: 98% 96% 97% 96%   Eyes: Blind left eye. ENMT: Mucous membranes are moist.   Neck: normal, supple, no masses, no thyromegaly Respiratory: clear to auscultation bilaterally, no wheezing, no crackles. Normal respiratory effort. No accessory muscle use.  Cardiovascular: Regular rate and rhythm, no murmurs / rubs / gallops. No extremity edema. 2+ pedal pulses.   Abdomen: no tenderness, no masses palpated. No hepatosplenomegaly. Bowel sounds positive.  Musculoskeletal: no clubbing / cyanosis. No joint deformity upper and lower extremities. Good ROM, no contractures. Normal muscle tone.  Skin: no rashes, lesions, ulcers. No induration Neurologic: Mild asymmetry to face  with very slight flattening of left nasolabial fold present at rest, not present when smiling, patient reports this has been present for ~2 years.  Otherwise speech clear without evidence of aphasia.  5 of 5 strength in all extremities.  Sensation intact globally.   Psychiatric: Normal judgment and insight. Alert and oriented x 3. Normal mood.   Labs on Admission: I have personally reviewed following labs and imaging studies  CBC: Recent Labs  Lab 03/03/21 1742  WBC 9.9  HGB 13.3  HCT 41.1  MCV 98.6  PLT 921   Basic Metabolic Panel: Recent Labs  Lab 03/03/21 1742  NA 137  K 5.0  CL 103  CO2 25  GLUCOSE 108*  BUN 25*  CREATININE 1.34*  CALCIUM 9.2   GFR: CrCl cannot be calculated (Unknown ideal weight.). Liver Function Tests: Recent Labs  Lab 03/03/21 1750  AST 27  ALT 24  ALKPHOS 53  BILITOT 0.7  PROT 7.3  ALBUMIN 4.4   CBG: Recent Labs  Lab 03/03/21 1913  GLUCAP 102*   Urine analysis:    Component Value Date/Time   COLORURINE YELLOW 03/03/2021 1910   APPEARANCEUR CLEAR 03/03/2021 1910   LABSPEC 1.017 03/03/2021 1910   PHURINE 5.0 03/03/2021 1910   GLUCOSEU NEGATIVE 03/03/2021 1910   HGBUR NEGATIVE 03/03/2021 1910   BILIRUBINUR NEGATIVE 03/03/2021 1910   KETONESUR NEGATIVE 03/03/2021 1910   PROTEINUR NEGATIVE 03/03/2021 1910   NITRITE NEGATIVE 03/03/2021 1910   LEUKOCYTESUR NEGATIVE 03/03/2021 1910    Radiological Exams on Admission: CT Head Wo Contrast  Result Date: 03/03/2021 CLINICAL DATA:  Trauma, ataxia. EXAM: CT HEAD WITHOUT CONTRAST CT CERVICAL SPINE WITHOUT CONTRAST TECHNIQUE: Multidetector CT imaging of the head and cervical spine was performed following the standard protocol without intravenous contrast. Multiplanar CT image reconstructions of the cervical spine were also generated. RADIATION DOSE REDUCTION: This exam was performed according to the departmental dose-optimization program which includes automated exposure control, adjustment  of the mA and/or kV according to patient size and/or use of iterative reconstruction technique. COMPARISON:  None. FINDINGS: CT HEAD FINDINGS Brain: Generalized age related parenchymal volume loss with commensurate dilatation of the ventricles and sulci. Mild chronic small vessel ischemic changes within the deep periventricular white matter regions bilaterally. Acute parenchymal and/or traumatic subarachnoid hemorrhage within the lower aspect of the LEFT frontal lobe (axial series 2, image 8; coronal series 4, image 19). Suspect additional small subdural hemorrhage overlying the tentorium and at the inferior margin of the anterior falx (series 2, image 15). Lastly, there is a small acute subdural hemorrhage overlying the LEFT frontotemporal lobe, measuring up to 3 mm thickness (series 2, image 13). No associated mass effect or midline shift. No midline shift or herniation. Vascular: Chronic calcified atherosclerotic changes of the large vessels at the skull base. No unexpected hyperdense vessel. Skull: Nondisplaced fracture of the RIGHT occipital bone extending to the RIGHT skull base. No extension into the adjacent RIGHT mastoid air cells. Sinuses/Orbits: No acute findings. Other: None. CT CERVICAL SPINE FINDINGS Alignment: Slight reversal of the normal cervical spine lordosis. No evidence of acute vertebral body subluxation. Skull base and vertebrae: No fracture line or displaced fracture fragment is seen. Soft tissues and spinal canal: No prevertebral fluid or swelling. No visible canal hematoma. Disc levels: Degenerative spondylosis throughout the cervical spine, moderate in degree with associated disc space narrowings and osseous spurring at most levels. Associated disc-osteophytic bulge at C4-5 causing moderate-to-severe central canal stenosis with probable effacement of the anterior thecal sac. Additional degenerative hypertrophic changes of the uncovertebral and facet joints causing severe bilateral neural  foramen stenoses at C4-5 and C5-6 with probable associated nerve root impingements. Upper chest: Limited images of the upper chest are unremarkable. Other: Bilateral carotid atherosclerosis. IMPRESSION: 1. Nondisplaced fracture of the RIGHT occipital bone extending to the RIGHT skull base. No extension into the adjacent RIGHT mastoid air cells. 2. Small focal acute parenchymal and/or traumatic subarachnoid hemorrhage within the lower aspect of the LEFT frontal lobe. 3. Small acute subdural hemorrhage overlying the LEFT frontotemporal lobe, measuring up to 3 mm thickness. No associated mass effect or midline shift. 4. Additional probable small foci of acute subdural hemorrhage overlying the tentorium and at the inferior margin of the anterior falx. 5. No fracture or acute subluxation within the cervical spine. 6. Extensive degenerative change within the mid/lower cervical spine, as detailed above. 7.  Bilateral carotid atherosclerosis. Critical Value/emergent results were called by telephone at the time of interpretation on 03/03/2021 at 6:18 pm to provider JOSEPH ZAMMIT , who verbally acknowledged these results. Electronically Signed   By: Franki Cabot M.D.   On: 03/03/2021 18:21   CT Cervical Spine Wo Contrast  Result Date: 03/03/2021 CLINICAL DATA:  Trauma, ataxia. EXAM: CT HEAD WITHOUT CONTRAST CT CERVICAL SPINE WITHOUT CONTRAST TECHNIQUE: Multidetector CT imaging of the head and cervical spine was performed following the standard protocol without intravenous contrast. Multiplanar CT image reconstructions of the cervical spine were also generated. RADIATION DOSE REDUCTION: This exam was performed according to the departmental dose-optimization program which includes automated exposure control, adjustment of the mA and/or kV according to patient size and/or use of iterative reconstruction technique. COMPARISON:  None. FINDINGS: CT HEAD FINDINGS Brain: Generalized age related parenchymal volume loss with  commensurate dilatation of the ventricles and sulci. Mild chronic small vessel ischemic changes within the deep periventricular white matter regions bilaterally. Acute parenchymal and/or traumatic subarachnoid hemorrhage within the lower aspect of the LEFT frontal lobe (axial series 2, image 8; coronal series 4, image 19). Suspect additional small subdural hemorrhage overlying the tentorium and at the inferior margin of the anterior falx (series 2, image 15). Lastly, there is a small acute subdural hemorrhage overlying the LEFT frontotemporal lobe, measuring up to 3 mm thickness (series 2, image 13). No associated mass effect or midline shift. No midline shift or herniation. Vascular: Chronic calcified atherosclerotic changes of the large vessels at the skull base. No unexpected hyperdense vessel. Skull: Nondisplaced fracture of the RIGHT occipital bone extending to the RIGHT skull base. No extension into the adjacent RIGHT mastoid air cells. Sinuses/Orbits: No acute findings. Other: None. CT CERVICAL SPINE FINDINGS Alignment: Slight reversal of the normal cervical spine lordosis. No evidence of acute vertebral body subluxation. Skull base and vertebrae: No fracture line or displaced fracture fragment is seen. Soft tissues and spinal canal: No prevertebral fluid or swelling. No visible canal hematoma. Disc levels: Degenerative spondylosis throughout the cervical spine, moderate in degree with associated disc space narrowings and osseous spurring at most levels. Associated disc-osteophytic bulge at C4-5 causing moderate-to-severe central canal stenosis with probable effacement of the anterior thecal sac. Additional degenerative hypertrophic changes of the uncovertebral and facet joints causing severe bilateral neural foramen stenoses at C4-5 and C5-6 with probable associated nerve root impingements. Upper chest: Limited images of the upper chest are unremarkable. Other: Bilateral carotid atherosclerosis. IMPRESSION:  1. Nondisplaced fracture of the RIGHT occipital bone extending to the RIGHT skull base. No extension into the adjacent RIGHT mastoid air cells. 2. Small focal acute parenchymal and/or traumatic subarachnoid hemorrhage within the lower aspect of the LEFT frontal lobe. 3. Small acute subdural hemorrhage overlying the LEFT frontotemporal lobe, measuring up to 3 mm thickness. No associated mass effect or midline shift. 4. Additional probable small foci of acute subdural hemorrhage overlying the tentorium and at the inferior margin of the anterior falx. 5. No fracture or acute subluxation within the cervical spine. 6. Extensive degenerative change within the mid/lower cervical spine, as detailed above. 7. Bilateral carotid atherosclerosis. Critical Value/emergent results were called by telephone at the time of interpretation on 03/03/2021 at 6:18 pm to provider JOSEPH ZAMMIT , who verbally acknowledged these results. Electronically Signed   By: Franki Cabot M.D.   On: 03/03/2021 18:21   DG Pelvis Portable  Result Date: 03/03/2021 CLINICAL DATA:  Trauma, fall, pain EXAM: PORTABLE PELVIS 1-2 VIEWS COMPARISON:  None. FINDINGS: No displaced fracture or dislocation is seen. Bony spurs seen in the lateral aspect of both hips, more so on the right side. Small sclerotic density seen in the neck of left femur may suggest benign bone island. Degenerative changes are noted in the visualized lower lumbar spine. IMPRESSION: No recent displaced fracture or dislocation is seen. Degenerative changes are noted in the hips, more so on the right side. If there are persistent symptoms, short-term follow-up radiographic examination or CT may be considered. Electronically Signed   By: Elmer Picker M.D.   On: 03/03/2021 19:46   DG Chest Port 1 View  Result Date: 03/03/2021 CLINICAL DATA:  Trauma, fall, chest pain EXAM: PORTABLE CHEST 1 VIEW COMPARISON:  Previous studies including the examination of 12/24/2020 FINDINGS:  Transverse diameter of heart is slightly increased. Apparent shift of mediastinum to the right may be due to rotation. Small linear densities in the lower lung fields have not changed significantly. There are no signs of alveolar pulmonary edema or new focal consolidation. There is no pleural effusion or pneumothorax. IMPRESSION: There are no new infiltrates or signs of pulmonary edema. Electronically Signed   By: Elmer Picker M.D.   On: 03/03/2021 19:44    EKG: Independently reviewed.  Sinus rhythm rate 84, QTc 461.  No significant change from prior.  Assessment/Plan Principal Problem:   Intracranial hemorrhage (HCC) Active Problems:   Syncope and collapse   Blind left eye   HTN (hypertension)   COPD (chronic obstructive pulmonary disease) (HCC)   Depression with anxiety  Assessment and Plan: * Intracranial hemorrhage (HCC) Head CT shows nondisplaced fracture of the right occipital bone extending to the right skull base Small focal acute parenchymal and or traumatic subarachnoid hemorrhage left frontal lobe Small acute subdural hemorrhage left frontal to temporal lobe Small foci of acute subdural hemorrhage overlying tentorium and inferior margin of anterior falx.  - EDP talked to Neurosurg- Dr. Annette Stable, recommended repeat CT in the morning -Admit to [facility] -Hold aspirin  Depression with anxiety Resume Lexapro, as needed Xanax 0.5 mg twice daily  COPD (chronic obstructive pulmonary disease) ([place])- (present on admission) Stable..  Quit smoking years ago. -Resume home albuterol inhaler as needed  HTN (hypertension)- (present on admission) Stable. -Resume Norvasc 5 mg, lisinopril HCTZ 2012.5 tomorrow  Syncope and collapse- (present on admission) Sudden collapse with subsequent head trauma, no prodrome, denies prior cardio or respiratory symptoms.  Troponin unremarkable 5 >5.  EKG without significant changes.  Vitals Stable.  X-ray clear.  Some slight flattening of left  nasolabial fold at rest, chronic and unchanged, per patient.  No other neurologic deficits. - Recent echocardiogram- 02/21/21-EF 55 to 60% with grade 1 DD.  No significant valvular abnormalities. -Check magnesium, TSH -Monitor on telemetry -If no etiology identified, may benefit from cardiology evaluation, extended outpatient cardiac monitoring   DVT prophylaxis: SCDS Code Status: Full Family Communication: Spouse at bedside Disposition Plan: ~ 2 days Consults called: None Admission status: Inpt stepdown I certify that at the point of admission it is my clinical judgment that the patient will require inpatient hospital care spanning beyond 2 midnights from the point of admission due to high intensity of service, high risk for further deterioration and high frequency of surveillance required.    Bethena Roys MD [facility]  03/03/2021, 9:48 PM

## 2021-03-03 NOTE — Assessment & Plan Note (Signed)
Stable. -Resume Norvasc 5 mg, lisinopril HCTZ 2012.5 tomorrow

## 2021-03-03 NOTE — ED Provider Notes (Signed)
[facility] EMERGENCY DEPARTMENT Provider Note   CSN: [id number] Arrival date & time: 03/03/21  1646     History  Chief Complaint  Patient presents with   Near Syncope    Jay [name] is a 71 y.o. male.  Patient states that he was in his garage and does not remember passing out but he woke up on the floor with severe headache.  His wife stated she thought maybe he was unconscious for 10 to 15 minutes because she was in and out of that garage for short period of time.  Patient has a history of hypertension  The history is provided by the patient and medical records.  Near Syncope This is a new problem. The current episode started less than 1 hour ago. The problem occurs rarely. The problem has been resolved. Associated symptoms include headaches. Pertinent negatives include no chest pain and no abdominal pain. Nothing aggravates the symptoms. Nothing relieves the symptoms. He has tried nothing for the symptoms.      Home Medications Prior to Admission medications   Medication Sig Start Date End Date Taking? Authorizing Provider  acetaminophen (TYLENOL) 500 MG tablet Take 1,000 mg by mouth every 6 (six) hours as needed for moderate pain.   Yes [provider]  albuterol (VENTOLIN HFA) 108 (90 Base) MCG/ACT inhaler Inhale 2 puffs into the lungs every 6 (six) hours as needed for wheezing or shortness of breath.   Yes [provider]  ALPRAZolam Duanne Moron) 0.5 MG tablet Take 0.5 mg by mouth as needed. 12/31/20  Yes [provider]  amLODipine (NORVASC) 5 MG tablet Take 5 mg by mouth every morning. 12/17/20  Yes [provider]  aspirin 81 MG tablet Take 81 mg by mouth daily.   Yes [provider]  atorvastatin (LIPITOR) 20 MG tablet Take 20 mg by mouth daily.   Yes [provider]  cyclobenzaprine (FLEXERIL) 10 MG tablet Take 5-10 mg by mouth at bedtime as needed. 12/03/20  Yes [provider]  donepezil (ARICEPT) 10 MG  tablet Take 10 mg by mouth at bedtime. 11/28/20  Yes [provider]  escitalopram (LEXAPRO) 20 MG tablet Take 20 mg by mouth 2 (two) times daily.   Yes [provider]  esomeprazole (NEXIUM) 40 MG capsule Take 40 mg by mouth daily before breakfast.   Yes [provider]  famotidine (PEPCID) 20 MG tablet Take 20 mg by mouth at bedtime. 11/28/20  Yes [provider]  HYDROcodone-acetaminophen (NORCO/VICODIN) 5-325 MG tablet Take 0.5-1 tablets by mouth every 6 (six) hours as needed. 01/15/21  Yes [provider]  lamoTRIgine (LAMICTAL) 200 MG tablet Take 200 mg by mouth daily.   Yes [provider]  lisinopril-hydrochlorothiazide (ZESTORETIC) 20-12.5 MG tablet Take 1 tablet by mouth every morning. 12/17/20  Yes [provider]  Multiple Vitamin (MULTIVITAMIN) tablet Take 1 tablet by mouth daily.   Yes [provider]  [employer] gummies   Yes [provider]  vitamin B-12 (CYANOCOBALAMIN) 500 MCG tablet Take 500 mcg by mouth daily.   Yes [provider]  [facility] injection  12/03/20   [provider]      Allergies    Patient has no known allergies.    Review of Systems   Review of Systems  Constitutional:  Negative for appetite change and fatigue.  HENT:  Negative for congestion, ear discharge and sinus pressure.   Eyes:  Negative for discharge.  Respiratory:  Negative for cough.   Cardiovascular:  Positive for near-syncope. Negative for chest pain.  Gastrointestinal:  Negative for abdominal pain and diarrhea.  Genitourinary:  Negative for frequency and hematuria.  Musculoskeletal:  Negative for back pain.  Skin:  Negative for rash.  Neurological:  Positive for headaches. Negative for seizures.  Psychiatric/Behavioral:  Negative for hallucinations.    Physical Exam Updated Vital Signs BP 134/71    Pulse 85    Temp 97.9 F (36.6 C) (Oral)    Resp 18    SpO2 97%   Physical Exam Vitals and nursing note reviewed.  Constitutional:      Appearance: He is well-developed.  HENT:     Head: Normocephalic.     Comments: Tender occipital    Nose: Nose normal.  Eyes:     General: No scleral icterus.    Conjunctiva/sclera: Conjunctivae normal.  Neck:     Thyroid: No thyromegaly.  Cardiovascular:     Rate and Rhythm: Normal rate and regular rhythm.     Heart sounds: No murmur heard.   No friction rub. No gallop.  Pulmonary:     Breath sounds: No stridor. No wheezing or rales.  Chest:     Chest wall: No tenderness.  Abdominal:     General: There is no distension.     Tenderness: There is no abdominal tenderness. There is no rebound.  Musculoskeletal:        General: Normal range of motion.     Cervical back: Neck supple.  Lymphadenopathy:     Cervical: No cervical adenopathy.  Skin:    Findings: No erythema or rash.  Neurological:     Mental Status: He is alert and oriented to person, place, and time.     Motor: No abnormal muscle tone.     Coordination: Coordination normal.  Psychiatric:        Behavior: Behavior normal.    ED Results / Procedures / Treatments   Labs (all labs ordered are listed, but only abnormal results are displayed) Labs Reviewed  BASIC METABOLIC PANEL - Abnormal; Notable for the following components:      Result Value   Glucose, Bld 108 (*)    BUN 25 (*)    Creatinine, Ser 1.34 (*)    GFR, Estimated 57 (*)    All other components within normal limits  CBC - Abnormal; Notable for the following components:   RBC 4.17 (*)    All other components within normal limits  CBG MONITORING, ED - Abnormal; Notable for the following components:   Glucose-Capillary 102 (*)    All other components within normal limits  URINALYSIS, ROUTINE W REFLEX MICROSCOPIC  HEPATIC FUNCTION PANEL  TROPONIN I (HIGH SENSITIVITY)  TROPONIN I (HIGH SENSITIVITY)    EKG EKG Interpretation  Date/Time:  Monday March 03 2021 17:31:35  EST Ventricular Rate:  85 PR Interval:  162 QRS Duration: 80 QT Interval:  346 QTC Calculation: 411 R Axis:   73 Text Interpretation: Sinus rhythm with occasional Premature ventricular complexes Nonspecific ST and T wave abnormality Abnormal ECG When compared with ECG of 24-Dec-2020 09:50, PREVIOUS ECG IS PRESENT Confirmed by Milton Ferguson [phone]) on 03/03/2021 5:50:40 PM  Radiology CT Head Wo Contrast  Result Date: 03/03/2021 CLINICAL DATA:  Trauma, ataxia. EXAM: CT HEAD WITHOUT CONTRAST CT CERVICAL SPINE WITHOUT CONTRAST TECHNIQUE: Multidetector CT imaging of the head and cervical spine was performed following the standard protocol without intravenous contrast. Multiplanar CT image reconstructions of the cervical spine  were also generated. RADIATION DOSE REDUCTION: This exam was performed according to the departmental dose-optimization program which includes automated exposure control, adjustment of the mA and/or kV according to patient size and/or use of iterative reconstruction technique. COMPARISON:  None. FINDINGS: CT HEAD FINDINGS Brain: Generalized age related parenchymal volume loss with commensurate dilatation of the ventricles and sulci. Mild chronic small vessel ischemic changes within the deep periventricular white matter regions bilaterally. Acute parenchymal and/or traumatic subarachnoid hemorrhage within the lower aspect of the LEFT frontal lobe (axial series 2, image 8; coronal series 4, image 19). Suspect additional small subdural hemorrhage overlying the tentorium and at the inferior margin of the anterior falx (series 2, image 15). Lastly, there is a small acute subdural hemorrhage overlying the LEFT frontotemporal lobe, measuring up to 3 mm thickness (series 2, image 13). No associated mass effect or midline shift. No midline shift or herniation. Vascular: Chronic calcified atherosclerotic changes of the large vessels at the skull base. No unexpected hyperdense vessel. Skull:  Nondisplaced fracture of the RIGHT occipital bone extending to the RIGHT skull base. No extension into the adjacent RIGHT mastoid air cells. Sinuses/Orbits: No acute findings. Other: None. CT CERVICAL SPINE FINDINGS Alignment: Slight reversal of the normal cervical spine lordosis. No evidence of acute vertebral body subluxation. Skull base and vertebrae: No fracture line or displaced fracture fragment is seen. Soft tissues and spinal canal: No prevertebral fluid or swelling. No visible canal hematoma. Disc levels: Degenerative spondylosis throughout the cervical spine, moderate in degree with associated disc space narrowings and osseous spurring at most levels. Associated disc-osteophytic bulge at C4-5 causing moderate-to-severe central canal stenosis with probable effacement of the anterior thecal sac. Additional degenerative hypertrophic changes of the uncovertebral and facet joints causing severe bilateral neural foramen stenoses at C4-5 and C5-6 with probable associated nerve root impingements. Upper chest: Limited images of the upper chest are unremarkable. Other: Bilateral carotid atherosclerosis. IMPRESSION: 1. Nondisplaced fracture of the RIGHT occipital bone extending to the RIGHT skull base. No extension into the adjacent RIGHT mastoid air cells. 2. Small focal acute parenchymal and/or traumatic subarachnoid hemorrhage within the lower aspect of the LEFT frontal lobe. 3. Small acute subdural hemorrhage overlying the LEFT frontotemporal lobe, measuring up to 3 mm thickness. No associated mass effect or midline shift. 4. Additional probable small foci of acute subdural hemorrhage overlying the tentorium and at the inferior margin of the anterior falx. 5. No fracture or acute subluxation within the cervical spine. 6. Extensive degenerative change within the mid/lower cervical spine, as detailed above. 7. Bilateral carotid atherosclerosis. Critical Value/emergent results were called by telephone at the time of  interpretation on 03/03/2021 at 6:18 pm to provider Marieann Zipp , who verbally acknowledged these results. Electronically Signed   By: Franki Cabot M.D.   On: 03/03/2021 18:21   CT Cervical Spine Wo Contrast  Result Date: 03/03/2021 CLINICAL DATA:  Trauma, ataxia. EXAM: CT HEAD WITHOUT CONTRAST CT CERVICAL SPINE WITHOUT CONTRAST TECHNIQUE: Multidetector CT imaging of the head and cervical spine was performed following the standard protocol without intravenous contrast. Multiplanar CT image reconstructions of the cervical spine were also generated. RADIATION DOSE REDUCTION: This exam was performed according to the departmental dose-optimization program which includes automated exposure control, adjustment of the mA and/or kV according to patient size and/or use of iterative reconstruction technique. COMPARISON:  None. FINDINGS: CT HEAD FINDINGS Brain: Generalized age related parenchymal volume loss with commensurate dilatation of the ventricles and sulci. Mild chronic small vessel ischemic changes  within the deep periventricular white matter regions bilaterally. Acute parenchymal and/or traumatic subarachnoid hemorrhage within the lower aspect of the LEFT frontal lobe (axial series 2, image 8; coronal series 4, image 19). Suspect additional small subdural hemorrhage overlying the tentorium and at the inferior margin of the anterior falx (series 2, image 15). Lastly, there is a small acute subdural hemorrhage overlying the LEFT frontotemporal lobe, measuring up to 3 mm thickness (series 2, image 13). No associated mass effect or midline shift. No midline shift or herniation. Vascular: Chronic calcified atherosclerotic changes of the large vessels at the skull base. No unexpected hyperdense vessel. Skull: Nondisplaced fracture of the RIGHT occipital bone extending to the RIGHT skull base. No extension into the adjacent RIGHT mastoid air cells. Sinuses/Orbits: No acute findings. Other: None. CT CERVICAL SPINE  FINDINGS Alignment: Slight reversal of the normal cervical spine lordosis. No evidence of acute vertebral body subluxation. Skull base and vertebrae: No fracture line or displaced fracture fragment is seen. Soft tissues and spinal canal: No prevertebral fluid or swelling. No visible canal hematoma. Disc levels: Degenerative spondylosis throughout the cervical spine, moderate in degree with associated disc space narrowings and osseous spurring at most levels. Associated disc-osteophytic bulge at C4-5 causing moderate-to-severe central canal stenosis with probable effacement of the anterior thecal sac. Additional degenerative hypertrophic changes of the uncovertebral and facet joints causing severe bilateral neural foramen stenoses at C4-5 and C5-6 with probable associated nerve root impingements. Upper chest: Limited images of the upper chest are unremarkable. Other: Bilateral carotid atherosclerosis. IMPRESSION: 1. Nondisplaced fracture of the RIGHT occipital bone extending to the RIGHT skull base. No extension into the adjacent RIGHT mastoid air cells. 2. Small focal acute parenchymal and/or traumatic subarachnoid hemorrhage within the lower aspect of the LEFT frontal lobe. 3. Small acute subdural hemorrhage overlying the LEFT frontotemporal lobe, measuring up to 3 mm thickness. No associated mass effect or midline shift. 4. Additional probable small foci of acute subdural hemorrhage overlying the tentorium and at the inferior margin of the anterior falx. 5. No fracture or acute subluxation within the cervical spine. 6. Extensive degenerative change within the mid/lower cervical spine, as detailed above. 7. Bilateral carotid atherosclerosis. Critical Value/emergent results were called by telephone at the time of interpretation on 03/03/2021 at 6:18 pm to provider Alison Breeding , who verbally acknowledged these results. Electronically Signed   By: Franki Cabot M.D.   On: 03/03/2021 18:21   DG Pelvis  Portable  Result Date: 03/03/2021 CLINICAL DATA:  Trauma, fall, pain EXAM: PORTABLE PELVIS 1-2 VIEWS COMPARISON:  None. FINDINGS: No displaced fracture or dislocation is seen. Bony spurs seen in the lateral aspect of both hips, more so on the right side. Small sclerotic density seen in the neck of left femur may suggest benign bone island. Degenerative changes are noted in the visualized lower lumbar spine. IMPRESSION: No recent displaced fracture or dislocation is seen. Degenerative changes are noted in the hips, more so on the right side. If there are persistent symptoms, short-term follow-up radiographic examination or CT may be considered. Electronically Signed   By: Elmer Picker M.D.   On: 03/03/2021 19:46   DG Chest Port 1 View  Result Date: 03/03/2021 CLINICAL DATA:  Trauma, fall, chest pain EXAM: PORTABLE CHEST 1 VIEW COMPARISON:  Previous studies including the examination of 12/24/2020 FINDINGS: Transverse diameter of heart is slightly increased. Apparent shift of mediastinum to the right may be due to rotation. Small linear densities in the lower lung fields have  not changed significantly. There are no signs of alveolar pulmonary edema or new focal consolidation. There is no pleural effusion or pneumothorax. IMPRESSION: There are no new infiltrates or signs of pulmonary edema. Electronically Signed   By: Elmer Picker M.D.   On: 03/03/2021 19:44    Procedures Procedures    Medications Ordered in ED Medications  sodium chloride 0.9 % bolus 1,000 mL (1,000 mLs Intravenous New Bag/Given 03/03/21 1854)    ED Course/ Medical Decision Making/ A&P  CRITICAL CARE Performed by: Milton Ferguson Total critical care time: 40 minutes Critical care time was exclusive of separately billable procedures and treating other patients. Critical care was necessary to treat or prevent imminent or life-threatening deterioration. Critical care was time spent personally by me on the following  activities: development of treatment plan with patient and/or surrogate as well as nursing, discussions with consultants, evaluation of patient's response to treatment, examination of patient, obtaining history from patient or surrogate, ordering and performing treatments and interventions, ordering and review of laboratory studies, ordering and review of radiographic studies, pulse oximetry and re-evaluation of patient's condition.                         Patient has a nondisplaced fracture of the right occipital bone along with a small subdural and small arachnoid bleeding.  I spoke with neurosurgery Dr. Annette Stable and he stated the patient should be admitted to the hospitalist and have a repeat CT scan in the morning.  Mostly because he is on aspirin Medical Decision Making Amount and/or Complexity of Data Reviewed Labs: ordered. Radiology: ordered.  Risk Decision regarding hospitalization.   Patient being admitted for head injury with subarachnoid bleed and subdural and fracture of the occipital bone along with syncopal episode for unknown reason   This patient presents to the ED for concern of syncope, this involves an extensive number of treatment options, and is a complaint that carries with it a high risk of complications and morbidity.  The differential diagnosis includes MI PE   Co morbidities that complicate the patient evaluation  Hypertension   Additional history obtained:  Additional history obtained from spouse External records from outside source obtained and reviewed including hospital record   Lab Tests:  I Ordered, and personally interpreted labs.  The pertinent results include: CBC and chemistries which showed mild dehydration with elevated BUN and creatinine   Imaging Studies ordered:  I ordered imaging studies including CT head I independently visualized and interpreted imaging which showed subarachnoid bleed I agree with the radiologist  interpretation   Cardiac Monitoring:  The patient was maintained on a cardiac monitor.  I personally viewed and interpreted the cardiac monitored which showed an underlying rhythm of: Normal sinus rhythm   Medicines ordered and prescription drug management:  I ordered medication including saline for dehydration Reevaluation of the patient after these medicines showed that the patient stayed the same I have reviewed the patients home medicines and have made adjustments as needed   Test Considered:  MRI   Critical Interventions:  Consult neurology   Consultations Obtained:  I requested consultation with the neurology and hospital,  and discussed lab and imaging findings as well as pertinent plan - they recommend: Admission and observation and stopping aspirin and repeat CT   Problem List / ED Course:  Head injury   Reevaluation:  After the interventions noted above, I reevaluated the patient and found that they have :stayed the same  Social Determinants of Health:  None   Dispostion:  After consideration of the diagnostic results and the patients response to treatment, I feel that the patent would benefit from admission and observation.         Final Clinical Impression(s) / ED Diagnoses Final diagnoses:  Injury of head, initial encounter    Rx / DC Orders ED Discharge Orders     None         Milton Ferguson, MD 03/04/21 [phone]

## 2021-03-03 NOTE — ED Triage Notes (Signed)
Syncopal episode earlier today, hit head and does not remember the incident

## 2021-03-03 NOTE — Assessment & Plan Note (Addendum)
Sudden collapse with subsequent head trauma, no prodrome, denies prior cardio or respiratory symptoms.  Troponin unremarkable 5 >5.  EKG without significant changes.  Vitals Stable.  X-ray clear.  Some slight flattening of left nasolabial fold at rest, chronic and unchanged, per patient.  No other neurologic deficits. - Recent echocardiogram- 02/21/21-EF 55 to 60% with grade 1 DD.  No significant valvular abnormalities. -Check magnesium, TSH -Monitor on telemetry -If no etiology identified, may benefit from cardiology evaluation, extended outpatient cardiac monitoring

## 2021-03-03 NOTE — Assessment & Plan Note (Signed)
Head CT shows nondisplaced fracture of the right occipital bone extending to the right skull base Small focal acute parenchymal and or traumatic subarachnoid hemorrhage left frontal lobe Small acute subdural hemorrhage left frontal to temporal lobe Small foci of acute subdural hemorrhage overlying tentorium and inferior margin of anterior falx.  - EDP talked to Neurosurg- Dr. Annette Stable, recommended repeat CT in the morning -Admit to [facility] -Hold aspirin

## 2021-03-04 ENCOUNTER — Inpatient Hospital Stay (HOSPITAL_COMMUNITY): Payer: Medicare HMO

## 2021-03-04 DIAGNOSIS — I615 Nontraumatic intracerebral hemorrhage, intraventricular: Secondary | ICD-10-CM | POA: Diagnosis not present

## 2021-03-04 DIAGNOSIS — S066X0A Traumatic subarachnoid hemorrhage without loss of consciousness, initial encounter: Secondary | ICD-10-CM | POA: Diagnosis not present

## 2021-03-04 DIAGNOSIS — S06360A Traumatic hemorrhage of cerebrum, unspecified, without loss of consciousness, initial encounter: Secondary | ICD-10-CM | POA: Diagnosis not present

## 2021-03-04 DIAGNOSIS — I629 Nontraumatic intracranial hemorrhage, unspecified: Secondary | ICD-10-CM | POA: Diagnosis not present

## 2021-03-04 DIAGNOSIS — S02119A Unspecified fracture of occiput, initial encounter for closed fracture: Secondary | ICD-10-CM | POA: Diagnosis not present

## 2021-03-04 LAB — BASIC METABOLIC PANEL
Anion gap: 10 (ref 5–15)
BUN: 17 mg/dL (ref 8–23)
CO2: 23 mmol/L (ref 22–32)
Calcium: 8.5 mg/dL — ABNORMAL LOW (ref 8.9–10.3)
Chloride: 103 mmol/L (ref 98–111)
Creatinine, Ser: 1.23 mg/dL (ref 0.61–1.24)
GFR, Estimated: >60 mL/min (ref >60)
Glucose, Bld: 101 mg/dL — ABNORMAL HIGH (ref 70–99)
Potassium: 4.2 mmol/L (ref 3.5–5.1)
Sodium: 136 mmol/L (ref 135–145)

## 2021-03-04 LAB — CBC
HCT: 36.2 % — ABNORMAL LOW (ref 39.0–52.0)
Hemoglobin: 12.3 g/dL — ABNORMAL LOW (ref 13.0–17.0)
MCH: 31.9 pg (ref 26.0–34.0)
MCHC: 34 g/dL (ref 30.0–36.0)
MCV: 94 fL (ref 80.0–100.0)
Platelets: 180 10*3/uL (ref 150–400)
RBC: 3.85 MIL/uL — ABNORMAL LOW (ref 4.22–5.81)
RDW: 13.2 % (ref 11.5–15.5)
WBC: 8.2 10*3/uL (ref 4.0–10.5)
nRBC: 0 % (ref 0.0–0.2)

## 2021-03-04 LAB — HIV ANTIBODY (ROUTINE TESTING W REFLEX): HIV Screen 4th Generation wRfx: NONREACTIVE

## 2021-03-04 MED ORDER — ONDANSETRON HCL 4 MG/2ML IJ SOLN
4.0000 mg | Freq: Four times a day (QID) | INTRAMUSCULAR | Status: DC | PRN
  Filled 2021-03-04: qty 2

## 2021-03-04 MED ORDER — HYDROCHLOROTHIAZIDE 12.5 MG PO TABS
12.5000 mg | ORAL_TABLET | Freq: Every day | ORAL | Status: DC
  Administered 2021-03-04: 12.5 mg via ORAL
  Filled 2021-03-04: qty 1

## 2021-03-04 MED ORDER — ESCITALOPRAM OXALATE 10 MG PO TABS
20.0000 mg | ORAL_TABLET | Freq: Two times a day (BID) | ORAL | Status: DC
  Administered 2021-03-04 (×2): 20 mg via ORAL
  Filled 2021-03-04 (×2): qty 2

## 2021-03-04 MED ORDER — LISINOPRIL-HYDROCHLOROTHIAZIDE 20-12.5 MG PO TABS: 1.0000 | ORAL_TABLET | Freq: Every morning | ORAL | Status: DC

## 2021-03-04 MED ORDER — ONDANSETRON HCL 4 MG PO TABS: 4.0000 mg | ORAL_TABLET | Freq: Four times a day (QID) | ORAL | Status: DC | PRN

## 2021-03-04 MED ORDER — ALPRAZOLAM 0.25 MG PO TABS: 0.5000 mg | ORAL_TABLET | Freq: Two times a day (BID) | ORAL | Status: DC | PRN

## 2021-03-04 MED ORDER — MORPHINE SULFATE (PF) 2 MG/ML IV SOLN
2.0000 mg | Freq: Once | INTRAVENOUS | Status: AC
  Administered 2021-03-04: 2 mg via INTRAVENOUS
  Filled 2021-03-04: qty 1

## 2021-03-04 MED ORDER — AMLODIPINE BESYLATE 5 MG PO TABS
5.0000 mg | ORAL_TABLET | Freq: Every morning | ORAL | Status: DC
Start: 2021-03-04 — End: 2021-03-05
  Administered 2021-03-04: 5 mg via ORAL
  Filled 2021-03-04: qty 1

## 2021-03-04 MED ORDER — ALBUTEROL SULFATE (2.5 MG/3ML) 0.083% IN NEBU: 3.0000 mL | INHALATION_SOLUTION | Freq: Four times a day (QID) | RESPIRATORY_TRACT | Status: DC | PRN

## 2021-03-04 MED ORDER — LAMOTRIGINE 100 MG PO TABS
200.0000 mg | ORAL_TABLET | Freq: Every day | ORAL | Status: DC
  Administered 2021-03-04: 200 mg via ORAL
  Filled 2021-03-04: qty 2

## 2021-03-04 MED ORDER — DONEPEZIL HCL 10 MG PO TABS
10.0000 mg | ORAL_TABLET | Freq: Every day | ORAL | Status: DC
  Administered 2021-03-04: 10 mg via ORAL
  Filled 2021-03-04: qty 1

## 2021-03-04 MED ORDER — SODIUM CHLORIDE 0.9 % IV SOLN: INTRAVENOUS | Status: AC

## 2021-03-04 MED ORDER — ACETAMINOPHEN 650 MG RE SUPP: 650.0000 mg | Freq: Four times a day (QID) | RECTAL | Status: DC | PRN

## 2021-03-04 MED ORDER — LISINOPRIL 20 MG PO TABS
20.0000 mg | ORAL_TABLET | Freq: Every day | ORAL | Status: DC
Start: 2021-03-04 — End: 2021-03-05
  Administered 2021-03-04: 20 mg via ORAL
  Filled 2021-03-04: qty 1

## 2021-03-04 MED ORDER — PANTOPRAZOLE SODIUM 40 MG PO TBEC
40.0000 mg | DELAYED_RELEASE_TABLET | Freq: Every day | ORAL | Status: DC
  Administered 2021-03-04: 40 mg via ORAL
  Filled 2021-03-04: qty 1

## 2021-03-04 MED ORDER — POLYETHYLENE GLYCOL 3350 17 G PO PACK: 17.0000 g | PACK | Freq: Every day | ORAL | Status: DC | PRN

## 2021-03-04 MED ORDER — ACETAMINOPHEN 325 MG PO TABS
650.0000 mg | ORAL_TABLET | Freq: Four times a day (QID) | ORAL | Status: DC | PRN
  Administered 2021-03-04: 650 mg via ORAL
  Filled 2021-03-04: qty 2

## 2021-03-04 MED ORDER — ASPIRIN 81 MG PO TABS: 81.0000 mg | ORAL_TABLET | Freq: Every day | ORAL | 0 refills | Status: DC

## 2021-03-04 NOTE — Care Management Obs Status (Signed)
[facility] NOTIFICATION   Patient Details  Name: Jay [name] MRN: [id number] Date of Birth: 1950-02-28   Medicare Observation Status Notification Given:  Yes    Angelita Ingles, RN 03/04/2021, 3:24 PM
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
## 2021-03-04 NOTE — Care Management CC44 (Signed)
Condition Code 44 Documentation Completed  Patient Details  Name: Jay [name] MRN: [id number] Date of Birth: [birth year]   Condition Code 44 given:  Yes Patient signature on Condition Code 44 notice:  Yes Documentation of 2 MD's agreement:  Yes Code 44 added to claim:  Yes     Angelita Ingles, RN 03/04/2021, 3:24 PM

## 2021-03-04 NOTE — Progress Notes (Signed)
Upon arrival [facility] expressed their concern regarding the patients distended and firm abdomen. Pt. Stated that he had abdominal pain that intermittently radiates to left shoulder. RN did not see this mentioned in the noted or given in report. Notification to on call per protocol.

## 2021-03-04 NOTE — Consult Note (Signed)
Reason for Consult: Traumatic brain injury Referring Physician: Medicine  Jay [name] is an 71 y.o. male.  HPI: 71 year old male status post syncopal episode with resultant fall and blow to the back of his head.  Patient awakened with some mild headache but no other neurologic symptoms.  Patient denies any chest pain or shortness of breath prior to the fall.  Patient with a history of labile blood pressures but no defined history of syncope in the past.  Patient continues to have some mild headache but no other neurologic symptoms.  Patient anxious to go home.  Past Medical History:  Diagnosis Date   Anxiety    Arthritis    COPD (chronic obstructive pulmonary disease) (HCC)    Depression    GERD (gastroesophageal reflux disease)    Hypertension    Shortness of breath    with excertion    Past Surgical History:  Procedure Laterality Date   APPENDECTOMY     EYE SURGERY      Family History  Problem Relation Age of Onset   Hypertension Mother    Cancer Father    Hypertension Father    Hypertension Sister    Cancer Brother    Hypertension Brother     Social History:  reports that he has quit smoking. He has never used smokeless tobacco. He reports that he does not drink alcohol and does not use drugs.  Allergies: No Known Allergies  Medications: I have reviewed the patient's current medications.  Results for orders placed or performed during the hospital encounter of 03/03/21 (from the past 48 hour(s))  Basic metabolic panel     Status: Abnormal   Collection Time: 03/03/21  5:42 PM  Result Value Ref Range   Sodium 137 135 - 145 mmol/L   Potassium 5.0 3.5 - 5.1 mmol/L   Chloride 103 98 - 111 mmol/L   CO2 25 22 - 32 mmol/L   Glucose, Bld 108 (H) 70 - 99 mg/dL    Comment: Glucose reference range applies only to samples taken after fasting for at least 8 hours.   BUN 25 (H) 8 - 23 mg/dL   Creatinine, Ser 1.34 (H) 0.61 - 1.24 mg/dL   Calcium 9.2 8.9 - 10.3 mg/dL   GFR,  Estimated 57 (L) >60 mL/min    Comment: (NOTE) Calculated using the CKD-EPI Creatinine Equation (2021)    Anion gap 9 5 - 15    Comment: Performed at [facility], [address] [place], [place] [address]  CBC     Status: Abnormal   Collection Time: 03/03/21  5:42 PM  Result Value Ref Range   WBC 9.9 4.0 - 10.5 K/uL   RBC 4.17 (L) 4.22 - 5.81 MIL/uL   Hemoglobin 13.3 13.0 - 17.0 g/dL   HCT 41.1 39.0 - 52.0 %   MCV 98.6 80.0 - 100.0 fL   MCH 31.9 26.0 - 34.0 pg   MCHC 32.4 30.0 - 36.0 g/dL   RDW 13.2 11.5 - 15.5 %   Platelets 217 150 - 400 K/uL   nRBC 0.0 0.0 - 0.2 %    Comment: Performed at [facility], [address], [place], [place] [address]  TSH     Status: None   Collection Time: 03/03/21  5:42 PM  Result Value Ref Range   TSH 0.843 0.350 - 4.500 uIU/mL    Comment: Performed by a 3rd Generation assay with a functional sensitivity of <=0.01 uIU/mL. Performed at [facility], [place]  [address] [place], [place] [address]   Troponin I (High Sensitivity)     Status: None   Collection Time: 03/03/21  5:50 PM  Result Value Ref Range   Troponin I (High Sensitivity) 5 <18 ng/L    Comment: (NOTE) Elevated high sensitivity troponin I (hsTnI) values and significant  changes across serial measurements may suggest ACS but many other  chronic and acute conditions are known to elevate hsTnI results.  Refer to the "Links" section for chest pain algorithms and additional  guidance. Performed at [facility], [address] [place], [place] [address]   Hepatic function panel     Status: None   Collection Time: 03/03/21  5:50 PM  Result Value Ref Range   Total Protein 7.3 6.5 - 8.1 g/dL   Albumin 4.4 3.5 - 5.0 g/dL   AST 27 15 - 41 U/L   ALT 24 0 - 44 U/L   Alkaline Phosphatase 53 38 - 126 U/L   Total Bilirubin 0.7 0.3 - 1.2 mg/dL   Bilirubin, Direct 0.1 0.0 - 0.2 mg/dL   Indirect Bilirubin 0.6 0.3 - 0.9 mg/dL    Comment: Performed at [facility], [address] [place],  [place] [address]  Urinalysis, Routine w reflex microscopic     Status: None   Collection Time: 03/03/21  7:10 PM  Result Value Ref Range   Color, Urine YELLOW YELLOW   APPearance CLEAR CLEAR   Specific Gravity, Urine 1.017 1.005 - 1.030   pH 5.0 5.0 - 8.0   Glucose, UA NEGATIVE NEGATIVE mg/dL   Hgb urine dipstick NEGATIVE NEGATIVE   Bilirubin Urine NEGATIVE NEGATIVE   Ketones, ur NEGATIVE NEGATIVE mg/dL   Protein, ur NEGATIVE NEGATIVE mg/dL   Nitrite NEGATIVE NEGATIVE   Leukocytes,Ua NEGATIVE NEGATIVE    Comment: Performed at [facility], [address], [place], [place] [address]  CBG monitoring, ED     Status: Abnormal   Collection Time: 03/03/21  7:13 PM  Result Value Ref Range   Glucose-Capillary 102 (H) 70 - 99 mg/dL    Comment: Glucose reference range applies only to samples taken after fasting for at least 8 hours.  Troponin I (High Sensitivity)     Status: None   Collection Time: 03/03/21  8:03 PM  Result Value Ref Range   Troponin I (High Sensitivity) 5 <18 ng/L    Comment: (NOTE) Elevated high sensitivity troponin I (hsTnI) values and significant  changes across serial measurements may suggest ACS but many other  chronic and acute conditions are known to elevate hsTnI results.  Refer to the "Links" section for chest pain algorithms and additional  guidance. Performed at [facility], [address] [place], [place] [address]   Magnesium     Status: None   Collection Time: 03/03/21  8:03 PM  Result Value Ref Range   Magnesium 1.7 1.7 - 2.4 mg/dL    Comment: Performed at [facility], [address], [place], [place] [address]  Resp Panel by RT-PCR (Flu A&B, Covid) Nasopharyngeal Swab     Status: None   Collection Time: 03/03/21  9:25 PM   Specimen: Nasopharyngeal Swab; Nasopharyngeal(NP) swabs in vial transport medium  Result Value Ref Range   SARS Coronavirus 2 by RT PCR NEGATIVE NEGATIVE    Comment: (NOTE) SARS-CoV-2 target nucleic acids are NOT DETECTED.  The  SARS-CoV-2 RNA is generally detectable in upper respiratory specimens during the acute phase of infection. The lowest concentration of SARS-CoV-2 viral copies this assay can detect  is 138 copies/mL. A negative result does not preclude SARS-Cov-2 infection and should not be used as the sole basis for treatment or other patient management decisions. A negative result may occur with  improper specimen collection/handling, submission of specimen other than nasopharyngeal swab, presence of viral mutation(s) within the areas targeted by this assay, and inadequate number of viral copies(<138 copies/mL). A negative result must be combined with clinical observations, patient history, and epidemiological information. The expected result is Negative.  Fact Sheet for Patients:  EntrepreneurPulse.com.au  Fact Sheet for Healthcare Providers:  IncredibleEmployment.be  This test is no t yet approved or cleared by the [place] FDA and  has been authorized for detection and/or diagnosis of SARS-CoV-2 by FDA under an Emergency Use Authorization (EUA). This EUA will remain  in effect (meaning this test can be used) for the duration of the COVID-19 declaration under Section 564(b)(1) of the Act, 21 U.S.C.section 360bbb-3(b)(1), unless the authorization is terminated  or revoked sooner.       Influenza A by PCR NEGATIVE NEGATIVE   Influenza B by PCR NEGATIVE NEGATIVE    Comment: (NOTE) The Xpert Xpress SARS-CoV-2/FLU/RSV plus assay is intended as an aid in the diagnosis of influenza from Nasopharyngeal swab specimens and should not be used as a sole basis for treatment. Nasal washings and aspirates are unacceptable for Xpert Xpress SARS-CoV-2/FLU/RSV testing.  Fact Sheet for Patients: EntrepreneurPulse.com.au  Fact Sheet for Healthcare Providers: IncredibleEmployment.be  This test is not yet approved or cleared by the  [place] FDA and has been authorized for detection and/or diagnosis of SARS-CoV-2 by FDA under an Emergency Use Authorization (EUA). This EUA will remain in effect (meaning this test can be used) for the duration of the COVID-19 declaration under Section 564(b)(1) of the Act, 21 U.S.C. section 360bbb-3(b)(1), unless the authorization is terminated or revoked.  Performed at [facility], [address] [place], [place] [address]   HIV Antibody (routine testing w rflx)     Status: None   Collection Time: 03/04/21  4:05 AM  Result Value Ref Range   HIV Screen 4th Generation wRfx Non Reactive Non Reactive    Comment: Performed at [facility], [place] [address] [place], [place] [address]  Basic metabolic panel     Status: Abnormal   Collection Time: 03/04/21  4:05 AM  Result Value Ref Range   Sodium 136 135 - 145 mmol/L   Potassium 4.2 3.5 - 5.1 mmol/L   Chloride 103 98 - 111 mmol/L   CO2 23 22 - 32 mmol/L   Glucose, Bld 101 (H) 70 - 99 mg/dL    Comment: Glucose reference range applies only to samples taken after fasting for at least 8 hours.   BUN 17 8 - 23 mg/dL   Creatinine, Ser 1.23 0.61 - 1.24 mg/dL   Calcium 8.5 (L) 8.9 - 10.3 mg/dL   GFR, Estimated >60 >60 mL/min    Comment: (NOTE) Calculated using the CKD-EPI Creatinine Equation (2021)    Anion gap 10 5 - 15    Comment: Performed at [place] [address], [place] [address]  CBC     Status: Abnormal   Collection Time: 03/04/21  4:05 AM  Result Value Ref Range   WBC 8.2 4.0 - 10.5 K/uL   RBC 3.85 (L) 4.22 - 5.81 MIL/uL   Hemoglobin 12.3 (L) 13.0 - 17.0 g/dL   HCT 36.2 (L) 39.0 - 52.0 %   MCV 94.0 80.0 -  100.0 fL   MCH 31.9 26.0 - 34.0 pg   MCHC 34.0 30.0 - 36.0 g/dL   RDW 13.2 11.5 - 15.5 %   Platelets 180 150 - 400 K/uL   nRBC 0.0 0.0 - 0.2 %    Comment: Performed at [facility], [place] [address] [place], [place] [address]    CT HEAD WO CONTRAST (5MM)  Result Date:  03/04/2021 CLINICAL DATA:  Follow-up on traumatic intracranial hemorrhage. On aspirin. EXAM: CT HEAD WITHOUT CONTRAST TECHNIQUE: Contiguous axial images were obtained from the base of the skull through the vertex without intravenous contrast. RADIATION DOSE REDUCTION: This exam was performed according to the departmental dose-optimization program which includes automated exposure control, adjustment of the mA and/or kV according to patient size and/or use of iterative reconstruction technique. COMPARISON:  March 03, 2021. FINDINGS: Brain: Small volume of layering acute hemorrhage in the occipital horn of the left ventricle, slightly increased in volume/conspicuity. Similar small volume of extra-axial hemorrhage along the right falx anteriorly. Also, similar subarachnoid hemorrhage along the inferior/anterior frontal lobes bilaterally with possible small volume intraparenchymal hemorrhage in the inferior left frontal lobe with associated suspected brain edema, compatible with contusion. No hydrocephalus. No midline shift, mass lesion, or evidence of acute large vascular territory infarct. Mild patchy white matter hypoattenuation, nonspecific but compatible with chronic microvascular ischemic disease. Vascular: No hyperdense vessel identified. Skull: No acute fracture. Sinuses/Orbits: Mild ethmoid air cell mucosal thickening. Redemonstrated nondisplaced right occipital bone fracture extending inferiorly to the right skull base. Left pthisis bulbi. Other: No mastoid effusions. IMPRESSION: 1. Small volume of layering acute hemorrhage in the left lateral ventricle, slightly increased in volume/conspicuity. No hydrocephalus. 2. Similar appearance of suspected bilateral inferior frontal lobe contusions with possible intraparenchymal hemorrhage inferiorly on the left and overlying subarachnoid hemorrhage. 3. Similar small volume of extra-axial hemorrhage more superiorly along the anterior right falx, probably subdural.  4. Redemonstrated nondisplaced right occipital bone fracture. Electronically Signed   By: Margaretha Sheffield M.D.   On: 03/04/2021 08:40   CT Head Wo Contrast  Result Date: 03/03/2021 CLINICAL DATA:  Trauma, ataxia. EXAM: CT HEAD WITHOUT CONTRAST CT CERVICAL SPINE WITHOUT CONTRAST TECHNIQUE: Multidetector CT imaging of the head and cervical spine was performed following the standard protocol without intravenous contrast. Multiplanar CT image reconstructions of the cervical spine were also generated. RADIATION DOSE REDUCTION: This exam was performed according to the departmental dose-optimization program which includes automated exposure control, adjustment of the mA and/or kV according to patient size and/or use of iterative reconstruction technique. COMPARISON:  None. FINDINGS: CT HEAD FINDINGS Brain: Generalized age related parenchymal volume loss with commensurate dilatation of the ventricles and sulci. Mild chronic small vessel ischemic changes within the deep periventricular white matter regions bilaterally. Acute parenchymal and/or traumatic subarachnoid hemorrhage within the lower aspect of the LEFT frontal lobe (axial series 2, image 8; coronal series 4, image 19). Suspect additional small subdural hemorrhage overlying the tentorium and at the inferior margin of the anterior falx (series 2, image 15). Lastly, there is a small acute subdural hemorrhage overlying the LEFT frontotemporal lobe, measuring up to 3 mm thickness (series 2, image 13). No associated mass effect or midline shift. No midline shift or herniation. Vascular: Chronic calcified atherosclerotic changes of the large vessels at the skull base. No unexpected hyperdense vessel. Skull: Nondisplaced fracture of the RIGHT occipital bone extending to the RIGHT skull base. No extension into the adjacent RIGHT mastoid air cells. Sinuses/Orbits: No acute findings. Other: None.  CT CERVICAL SPINE FINDINGS Alignment: Slight reversal of the normal  cervical spine lordosis. No evidence of acute vertebral body subluxation. Skull base and vertebrae: No fracture line or displaced fracture fragment is seen. Soft tissues and spinal canal: No prevertebral fluid or swelling. No visible canal hematoma. Disc levels: Degenerative spondylosis throughout the cervical spine, moderate in degree with associated disc space narrowings and osseous spurring at most levels. Associated disc-osteophytic bulge at C4-5 causing moderate-to-severe central canal stenosis with probable effacement of the anterior thecal sac. Additional degenerative hypertrophic changes of the uncovertebral and facet joints causing severe bilateral neural foramen stenoses at C4-5 and C5-6 with probable associated nerve root impingements. Upper chest: Limited images of the upper chest are unremarkable. Other: Bilateral carotid atherosclerosis. IMPRESSION: 1. Nondisplaced fracture of the RIGHT occipital bone extending to the RIGHT skull base. No extension into the adjacent RIGHT mastoid air cells. 2. Small focal acute parenchymal and/or traumatic subarachnoid hemorrhage within the lower aspect of the LEFT frontal lobe. 3. Small acute subdural hemorrhage overlying the LEFT frontotemporal lobe, measuring up to 3 mm thickness. No associated mass effect or midline shift. 4. Additional probable small foci of acute subdural hemorrhage overlying the tentorium and at the inferior margin of the anterior falx. 5. No fracture or acute subluxation within the cervical spine. 6. Extensive degenerative change within the mid/lower cervical spine, as detailed above. 7. Bilateral carotid atherosclerosis. Critical Value/emergent results were called by telephone at the time of interpretation on 03/03/2021 at 6:18 pm to provider JOSEPH ZAMMIT , who verbally acknowledged these results. Electronically Signed   By: Franki Cabot M.D.   On: 03/03/2021 18:21   CT Cervical Spine Wo Contrast  Result Date: 03/03/2021 CLINICAL DATA:   Trauma, ataxia. EXAM: CT HEAD WITHOUT CONTRAST CT CERVICAL SPINE WITHOUT CONTRAST TECHNIQUE: Multidetector CT imaging of the head and cervical spine was performed following the standard protocol without intravenous contrast. Multiplanar CT image reconstructions of the cervical spine were also generated. RADIATION DOSE REDUCTION: This exam was performed according to the departmental dose-optimization program which includes automated exposure control, adjustment of the mA and/or kV according to patient size and/or use of iterative reconstruction technique. COMPARISON:  None. FINDINGS: CT HEAD FINDINGS Brain: Generalized age related parenchymal volume loss with commensurate dilatation of the ventricles and sulci. Mild chronic small vessel ischemic changes within the deep periventricular white matter regions bilaterally. Acute parenchymal and/or traumatic subarachnoid hemorrhage within the lower aspect of the LEFT frontal lobe (axial series 2, image 8; coronal series 4, image 19). Suspect additional small subdural hemorrhage overlying the tentorium and at the inferior margin of the anterior falx (series 2, image 15). Lastly, there is a small acute subdural hemorrhage overlying the LEFT frontotemporal lobe, measuring up to 3 mm thickness (series 2, image 13). No associated mass effect or midline shift. No midline shift or herniation. Vascular: Chronic calcified atherosclerotic changes of the large vessels at the skull base. No unexpected hyperdense vessel. Skull: Nondisplaced fracture of the RIGHT occipital bone extending to the RIGHT skull base. No extension into the adjacent RIGHT mastoid air cells. Sinuses/Orbits: No acute findings. Other: None. CT CERVICAL SPINE FINDINGS Alignment: Slight reversal of the normal cervical spine lordosis. No evidence of acute vertebral body subluxation. Skull base and vertebrae: No fracture line or displaced fracture fragment is seen. Soft tissues and spinal canal: No prevertebral fluid  or swelling. No visible canal hematoma. Disc levels: Degenerative spondylosis throughout the cervical spine, moderate in degree with associated disc space narrowings and  osseous spurring at most levels. Associated disc-osteophytic bulge at C4-5 causing moderate-to-severe central canal stenosis with probable effacement of the anterior thecal sac. Additional degenerative hypertrophic changes of the uncovertebral and facet joints causing severe bilateral neural foramen stenoses at C4-5 and C5-6 with probable associated nerve root impingements. Upper chest: Limited images of the upper chest are unremarkable. Other: Bilateral carotid atherosclerosis. IMPRESSION: 1. Nondisplaced fracture of the RIGHT occipital bone extending to the RIGHT skull base. No extension into the adjacent RIGHT mastoid air cells. 2. Small focal acute parenchymal and/or traumatic subarachnoid hemorrhage within the lower aspect of the LEFT frontal lobe. 3. Small acute subdural hemorrhage overlying the LEFT frontotemporal lobe, measuring up to 3 mm thickness. No associated mass effect or midline shift. 4. Additional probable small foci of acute subdural hemorrhage overlying the tentorium and at the inferior margin of the anterior falx. 5. No fracture or acute subluxation within the cervical spine. 6. Extensive degenerative change within the mid/lower cervical spine, as detailed above. 7. Bilateral carotid atherosclerosis. Critical Value/emergent results were called by telephone at the time of interpretation on 03/03/2021 at 6:18 pm to provider JOSEPH ZAMMIT , who verbally acknowledged these results. Electronically Signed   By: Franki Cabot M.D.   On: 03/03/2021 18:21   DG Pelvis Portable  Result Date: 03/03/2021 CLINICAL DATA:  Trauma, fall, pain EXAM: PORTABLE PELVIS 1-2 VIEWS COMPARISON:  None. FINDINGS: No displaced fracture or dislocation is seen. Bony spurs seen in the lateral aspect of both hips, more so on the right side. Small sclerotic  density seen in the neck of left femur may suggest benign bone island. Degenerative changes are noted in the visualized lower lumbar spine. IMPRESSION: No recent displaced fracture or dislocation is seen. Degenerative changes are noted in the hips, more so on the right side. If there are persistent symptoms, short-term follow-up radiographic examination or CT may be considered. Electronically Signed   By: Elmer Picker M.D.   On: 03/03/2021 19:46   DG Chest Port 1 View  Result Date: 03/03/2021 CLINICAL DATA:  Trauma, fall, chest pain EXAM: PORTABLE CHEST 1 VIEW COMPARISON:  Previous studies including the examination of 12/24/2020 FINDINGS: Transverse diameter of heart is slightly increased. Apparent shift of mediastinum to the right may be due to rotation. Small linear densities in the lower lung fields have not changed significantly. There are no signs of alveolar pulmonary edema or new focal consolidation. There is no pleural effusion or pneumothorax. IMPRESSION: There are no new infiltrates or signs of pulmonary edema. Electronically Signed   By: Elmer Picker M.D.   On: 03/03/2021 19:44    Pertinent items noted in HPI and remainder of comprehensive ROS otherwise negative. Blood pressure (!) 138/57, pulse 79, temperature 98.7 F (37.1 C), temperature source Oral, resp. rate (!) 24, SpO2 96 %. Patient is awake and alert.  He is oriented and appropriate.  Speech is fluent.  Judgment insight are intact.  Cranial nerve function normal bilateral.  Motor examination 5/5 bilaterally.  No pronator drift.  Reflexes normal.  Examination head ears eyes nose and throat demonstrates some mild posterior tenderness but no significant bony abnormality.  Oropharynx, nasopharynx and external auditory canals are clear.  Neck is supple.  Airway is midline.  Pulses are normal.  Chest and abdomen are benign.  Extremities are free from injury or deformity.  Assessment/Plan: Status post syncope with resultant  traumatic brain injury.  Follow-up scan with some minimal traumatic subarachnoid hemorrhage and very small frontal contusions of  no significance.  Patient okay to be mobilized and is fine for discharge once cause for syncope better elucidated.  Maybe resume aspirin in 2 weeks.  Patient also with a nondisplaced occipital skull fracture which requires no treatment.  Cooper Render Annamae Shivley 03/04/2021, 10:49 AM

## 2021-03-04 NOTE — Discharge Summary (Signed)
[facility] Discharge Summary  Jay [name] LEX:[id number] DOB: [birth year] DOA: 03/03/2021  PCP: Maury Dus, MD  Admit date: 03/03/2021 Discharge date: 03/04/2021 30 Day Unplanned Readmission Risk Score    Flowsheet Row ED to Hosp-Admission (Current) from 03/03/2021 in [facility]  30 Day Unplanned Readmission Risk Score (%) 13.3 Filed at 03/04/2021 1200       This score is the patient's risk of an unplanned readmission within 30 days of being discharged (0 -100%). The score is based on dignosis, age, lab data, medications, orders, and past utilization.   Low:  0-14.9   Medium: 15-21.9   High: 22-29.9   Extreme: 30 and above          Admitted From: Home Disposition: Home  Recommendations for Outpatient Follow-up:  Follow up with PCP in 1-2 weeks Please obtain BMP/CBC in one week Please follow up with your PCP on the following pending results: Unresulted Labs (From admission, onward)    None         Home Health: No. Equipment/Devices: None  Discharge Condition: Stable CODE STATUS: Full code Diet recommendation: Cardiac  Subjective: Seen and examined this morning.  He was fully alert and oriented with no symptoms at all.  Wife at the bedside.  He is anxious to go home.  Following HPI and ED course is copied from my colleague admitting hospitalist Dr. Talmadge Coventry H&P. HPI: Jay [name] is a 71 y.o. male with medical history significant for COPD, arthritis, depression and anxiety, hypertension. Patient was brought to the ED with reports of passing out.  Patient was cleaning out his garage, and the last thing he remembers was walking towards the table to clean it out.  He does not remember having any prior symptoms, he tells me woke up this morning feeling very well, this happened about 11:00 in the morning he had not eaten breakfast but this is not unusual for him.  He was out for maybe about 15 minutes when spouse checked on him and found  patient on the floor in the garage.  He reports occasional limiting dizziness that occurs initially on standing up that resolves, but he did not have that today.  No chest pain.  No difficulty breathing.  No leg swelling.  No palpitations.  No prior symptoms.  No history of falls.  He has maintained stable oral intake, no vomiting, no loose stools.   ED Course: Blood pressure 120s to 140s.  Stable vitals.  Troponin 5 > 5.  Head CT shows nondisplaced fracture of the right occipital bone extending to the right skull base Small focal acute parenchymal and or traumatic subarachnoid hemorrhage left frontal lobe Small acute subdural hemorrhage left frontal to temporal lobe Small foci of acute subdural hemorrhage overlying tentorium and inferior margin of anterior falx   Cervical CT without acute abnormality.   EDP talked to neurosurgeon on-call, Dr. Annette Stable, recommended hospitalist admission and repeat imaging in the morning, patient is on aspirin.  Brief/Interim Summary: Patient was admitted to [facility] for neurology consultation.  Repeat head CT was done per Dr. Darcella Cheshire recommendation which showed IMPRESSION: 1. Small volume of layering acute hemorrhage in the left lateral ventricle, slightly increased in volume/conspicuity. No hydrocephalus. 2. Similar appearance of suspected bilateral inferior frontal lobe contusions with possible intraparenchymal hemorrhage inferiorly on the left and overlying subarachnoid hemorrhage. 3. Similar small volume of extra-axial hemorrhage more superiorly along the anterior right falx, probably subdural. 4. Redemonstrated nondisplaced right occipital bone  fracture.  However per Dr. Irven Baltimore personal review, follow-up scan with some minimal traumatic subarachnoid hemorrhage and very small frontal contusions of no significance and patient was cleared for discharge.  He was seen by PT OT.  No OT needs but PT recommended outpatient PT.  Dr. Trenton Gammon recommended to  continue to hold aspirin for 2 days and then resume back.  Patient also eager to go home and cleared from neurosurgery standpoint so he is being discharged home.  Discharge plan was discussed with patient and/or family member and they verbalized understanding and agreed with it.  Discharge Diagnoses:  Principal Problem:   Intracranial hemorrhage ([facility]) Active Problems:   Syncope and collapse   Blind left eye   HTN (hypertension)   COPD (chronic obstructive pulmonary disease) (HCC)   Depression with anxiety   Intracranial bleeding ([facility])    Discharge Instructions   Allergies as of 03/04/2021   No Known Allergies      Medication List     TAKE these medications    acetaminophen 500 MG tablet Commonly known as: TYLENOL Take 1,000 mg by mouth every 6 (six) hours as needed for moderate pain.   albuterol 108 (90 Base) MCG/ACT inhaler Commonly known as: VENTOLIN HFA Inhale 2 puffs into the lungs every 6 (six) hours as needed for wheezing or shortness of breath.   ALPRAZolam 0.5 MG tablet Commonly known as: XANAX Take 0.5 mg by mouth as needed.   amLODipine 5 MG tablet Commonly known as: NORVASC Take 5 mg by mouth every morning.   aspirin 81 MG tablet Take 1 tablet (81 mg total) by mouth daily. Start taking on: March 18, 2021 What changed: These instructions start on March 18, 2021. If you are unsure what to do until then, ask your doctor or other care provider.   atorvastatin 20 MG tablet Commonly known as: LIPITOR Take 20 mg by mouth daily.   cyclobenzaprine 10 MG tablet Commonly known as: FLEXERIL Take 5-10 mg by mouth at bedtime as needed.   donepezil 10 MG tablet Commonly known as: ARICEPT Take 10 mg by mouth at bedtime.   escitalopram 20 MG tablet Commonly known as: LEXAPRO Take 20 mg by mouth 2 (two) times daily.   esomeprazole 40 MG capsule Commonly known as: NEXIUM Take 40 mg by mouth daily before breakfast.   famotidine 20 MG tablet Commonly known  as: PEPCID Take 20 mg by mouth at bedtime.   HYDROcodone-acetaminophen 5-325 MG tablet Commonly known as: NORCO/VICODIN Take 0.5-1 tablets by mouth every 6 (six) hours as needed.   lamoTRIgine 200 MG tablet Commonly known as: LAMICTAL Take 200 mg by mouth daily.   lisinopril-hydrochlorothiazide 20-12.5 MG tablet Commonly known as: ZESTORETIC Take 1 tablet by mouth every morning.   multivitamin tablet Take 1 tablet by mouth daily.   OVER THE COUNTER MEDICATION Cbd wellness gummies   Shingrix injection Generic drug: Zoster Vaccine Adjuvanted   vitamin B-12 500 MCG tablet Commonly known as: CYANOCOBALAMIN Take 500 mcg by mouth daily.        Follow-up Information     Maury Dus, MD Follow up in 1 week(s).   Specialty: Family Medicine Contact information: [place] [address] [phone]         Arnoldo Lenis, MD .   Specialty: Cardiology Contact information: [place] [address] [phone]                No Known  Allergies  Consultations: Neurosurgery   Procedures/Studies: CT HEAD WO CONTRAST (5MM)  Result Date: 03/04/2021 CLINICAL DATA:  Follow-up on traumatic intracranial hemorrhage. On aspirin. EXAM: CT HEAD WITHOUT CONTRAST TECHNIQUE: Contiguous axial images were obtained from the base of the skull through the vertex without intravenous contrast. RADIATION DOSE REDUCTION: This exam was performed according to the departmental dose-optimization program which includes automated exposure control, adjustment of the mA and/or kV according to patient size and/or use of iterative reconstruction technique. COMPARISON:  March 03, 2021. FINDINGS: Brain: Small volume of layering acute hemorrhage in the occipital horn of the left ventricle, slightly increased in volume/conspicuity. Similar small volume of extra-axial hemorrhage along the right falx anteriorly. Also, similar subarachnoid  hemorrhage along the inferior/anterior frontal lobes bilaterally with possible small volume intraparenchymal hemorrhage in the inferior left frontal lobe with associated suspected brain edema, compatible with contusion. No hydrocephalus. No midline shift, mass lesion, or evidence of acute large vascular territory infarct. Mild patchy white matter hypoattenuation, nonspecific but compatible with chronic microvascular ischemic disease. Vascular: No hyperdense vessel identified. Skull: No acute fracture. Sinuses/Orbits: Mild ethmoid air cell mucosal thickening. Redemonstrated nondisplaced right occipital bone fracture extending inferiorly to the right skull base. Left pthisis bulbi. Other: No mastoid effusions. IMPRESSION: 1. Small volume of layering acute hemorrhage in the left lateral ventricle, slightly increased in volume/conspicuity. No hydrocephalus. 2. Similar appearance of suspected bilateral inferior frontal lobe contusions with possible intraparenchymal hemorrhage inferiorly on the left and overlying subarachnoid hemorrhage. 3. Similar small volume of extra-axial hemorrhage more superiorly along the anterior right falx, probably subdural. 4. Redemonstrated nondisplaced right occipital bone fracture. Electronically Signed   By: Margaretha Sheffield M.D.   On: 03/04/2021 08:40   CT Head Wo Contrast  Result Date: 03/03/2021 CLINICAL DATA:  Trauma, ataxia. EXAM: CT HEAD WITHOUT CONTRAST CT CERVICAL SPINE WITHOUT CONTRAST TECHNIQUE: Multidetector CT imaging of the head and cervical spine was performed following the standard protocol without intravenous contrast. Multiplanar CT image reconstructions of the cervical spine were also generated. RADIATION DOSE REDUCTION: This exam was performed according to the departmental dose-optimization program which includes automated exposure control, adjustment of the mA and/or kV according to patient size and/or use of iterative reconstruction technique. COMPARISON:  None.  FINDINGS: CT HEAD FINDINGS Brain: Generalized age related parenchymal volume loss with commensurate dilatation of the ventricles and sulci. Mild chronic small vessel ischemic changes within the deep periventricular white matter regions bilaterally. Acute parenchymal and/or traumatic subarachnoid hemorrhage within the lower aspect of the LEFT frontal lobe (axial series 2, image 8; coronal series 4, image 19). Suspect additional small subdural hemorrhage overlying the tentorium and at the inferior margin of the anterior falx (series 2, image 15). Lastly, there is a small acute subdural hemorrhage overlying the LEFT frontotemporal lobe, measuring up to 3 mm thickness (series 2, image 13). No associated mass effect or midline shift. No midline shift or herniation. Vascular: Chronic calcified atherosclerotic changes of the large vessels at the skull base. No unexpected hyperdense vessel. Skull: Nondisplaced fracture of the RIGHT occipital bone extending to the RIGHT skull base. No extension into the adjacent RIGHT mastoid air cells. Sinuses/Orbits: No acute findings. Other: None. CT CERVICAL SPINE FINDINGS Alignment: Slight reversal of the normal cervical spine lordosis. No evidence of acute vertebral body subluxation. Skull base and vertebrae: No fracture line or displaced fracture fragment is seen. Soft tissues and spinal canal: No prevertebral fluid or swelling. No visible canal hematoma. Disc levels: Degenerative spondylosis throughout the cervical spine,  moderate in degree with associated disc space narrowings and osseous spurring at most levels. Associated disc-osteophytic bulge at C4-5 causing moderate-to-severe central canal stenosis with probable effacement of the anterior thecal sac. Additional degenerative hypertrophic changes of the uncovertebral and facet joints causing severe bilateral neural foramen stenoses at C4-5 and C5-6 with probable associated nerve root impingements. Upper chest: Limited images of  the upper chest are unremarkable. Other: Bilateral carotid atherosclerosis. IMPRESSION: 1. Nondisplaced fracture of the RIGHT occipital bone extending to the RIGHT skull base. No extension into the adjacent RIGHT mastoid air cells. 2. Small focal acute parenchymal and/or traumatic subarachnoid hemorrhage within the lower aspect of the LEFT frontal lobe. 3. Small acute subdural hemorrhage overlying the LEFT frontotemporal lobe, measuring up to 3 mm thickness. No associated mass effect or midline shift. 4. Additional probable small foci of acute subdural hemorrhage overlying the tentorium and at the inferior margin of the anterior falx. 5. No fracture or acute subluxation within the cervical spine. 6. Extensive degenerative change within the mid/lower cervical spine, as detailed above. 7. Bilateral carotid atherosclerosis. Critical Value/emergent results were called by telephone at the time of interpretation on 03/03/2021 at 6:18 pm to provider JOSEPH ZAMMIT , who verbally acknowledged these results. Electronically Signed   By: Franki Cabot M.D.   On: 03/03/2021 18:21   CT Cervical Spine Wo Contrast  Result Date: 03/03/2021 CLINICAL DATA:  Trauma, ataxia. EXAM: CT HEAD WITHOUT CONTRAST CT CERVICAL SPINE WITHOUT CONTRAST TECHNIQUE: Multidetector CT imaging of the head and cervical spine was performed following the standard protocol without intravenous contrast. Multiplanar CT image reconstructions of the cervical spine were also generated. RADIATION DOSE REDUCTION: This exam was performed according to the departmental dose-optimization program which includes automated exposure control, adjustment of the mA and/or kV according to patient size and/or use of iterative reconstruction technique. COMPARISON:  None. FINDINGS: CT HEAD FINDINGS Brain: Generalized age related parenchymal volume loss with commensurate dilatation of the ventricles and sulci. Mild chronic small vessel ischemic changes within the deep  periventricular white matter regions bilaterally. Acute parenchymal and/or traumatic subarachnoid hemorrhage within the lower aspect of the LEFT frontal lobe (axial series 2, image 8; coronal series 4, image 19). Suspect additional small subdural hemorrhage overlying the tentorium and at the inferior margin of the anterior falx (series 2, image 15). Lastly, there is a small acute subdural hemorrhage overlying the LEFT frontotemporal lobe, measuring up to 3 mm thickness (series 2, image 13). No associated mass effect or midline shift. No midline shift or herniation. Vascular: Chronic calcified atherosclerotic changes of the large vessels at the skull base. No unexpected hyperdense vessel. Skull: Nondisplaced fracture of the RIGHT occipital bone extending to the RIGHT skull base. No extension into the adjacent RIGHT mastoid air cells. Sinuses/Orbits: No acute findings. Other: None. CT CERVICAL SPINE FINDINGS Alignment: Slight reversal of the normal cervical spine lordosis. No evidence of acute vertebral body subluxation. Skull base and vertebrae: No fracture line or displaced fracture fragment is seen. Soft tissues and spinal canal: No prevertebral fluid or swelling. No visible canal hematoma. Disc levels: Degenerative spondylosis throughout the cervical spine, moderate in degree with associated disc space narrowings and osseous spurring at most levels. Associated disc-osteophytic bulge at C4-5 causing moderate-to-severe central canal stenosis with probable effacement of the anterior thecal sac. Additional degenerative hypertrophic changes of the uncovertebral and facet joints causing severe bilateral neural foramen stenoses at C4-5 and C5-6 with probable associated nerve root impingements. Upper chest: Limited images of the upper  chest are unremarkable. Other: Bilateral carotid atherosclerosis. IMPRESSION: 1. Nondisplaced fracture of the RIGHT occipital bone extending to the RIGHT skull base. No extension into the  adjacent RIGHT mastoid air cells. 2. Small focal acute parenchymal and/or traumatic subarachnoid hemorrhage within the lower aspect of the LEFT frontal lobe. 3. Small acute subdural hemorrhage overlying the LEFT frontotemporal lobe, measuring up to 3 mm thickness. No associated mass effect or midline shift. 4. Additional probable small foci of acute subdural hemorrhage overlying the tentorium and at the inferior margin of the anterior falx. 5. No fracture or acute subluxation within the cervical spine. 6. Extensive degenerative change within the mid/lower cervical spine, as detailed above. 7. Bilateral carotid atherosclerosis. Critical Value/emergent results were called by telephone at the time of interpretation on 03/03/2021 at 6:18 pm to provider JOSEPH ZAMMIT , who verbally acknowledged these results. Electronically Signed   By: Franki Cabot M.D.   On: 03/03/2021 18:21   DG Pelvis Portable  Result Date: 03/03/2021 CLINICAL DATA:  Trauma, fall, pain EXAM: PORTABLE PELVIS 1-2 VIEWS COMPARISON:  None. FINDINGS: No displaced fracture or dislocation is seen. Bony spurs seen in the lateral aspect of both hips, more so on the right side. Small sclerotic density seen in the neck of left femur may suggest benign bone island. Degenerative changes are noted in the visualized lower lumbar spine. IMPRESSION: No recent displaced fracture or dislocation is seen. Degenerative changes are noted in the hips, more so on the right side. If there are persistent symptoms, short-term follow-up radiographic examination or CT may be considered. Electronically Signed   By: Elmer Picker M.D.   On: 03/03/2021 19:46   DG Chest Port 1 View  Result Date: 03/03/2021 CLINICAL DATA:  Trauma, fall, chest pain EXAM: PORTABLE CHEST 1 VIEW COMPARISON:  Previous studies including the examination of 12/24/2020 FINDINGS: Transverse diameter of heart is slightly increased. Apparent shift of mediastinum to the right may be due to rotation.  Small linear densities in the lower lung fields have not changed significantly. There are no signs of alveolar pulmonary edema or new focal consolidation. There is no pleural effusion or pneumothorax. IMPRESSION: There are no new infiltrates or signs of pulmonary edema. Electronically Signed   By: Elmer Picker M.D.   On: 03/03/2021 19:44   ECHOCARDIOGRAM COMPLETE  Result Date: 02/21/2021    ECHOCARDIOGRAM REPORT   Patient Name:   USMAN MILLETT Date of Exam: 02/21/2021 Medical Rec #:  [id number]       Height:       69.0 in Accession #:    [id number]      Weight:       193.0 lb Date of Birth:  01/01/1951       BSA:          2.035 m Patient Age:    37 years        BP:           123/61 mmHg Patient Gender: M               HR:           71 bpm. Exam Location:  [facility] Procedure: 2D Echo, Cardiac Doppler and Color Doppler Indications:    PVC  History:        Patient has no prior history of Echocardiogram examinations.                 Arrythmias:PVC.  Sonographer:    Wenda Low Referring Phys: [phone]  JONATHAN F BRANCH IMPRESSIONS  1. Left ventricular ejection fraction, by estimation, is 55 to 60%. The left ventricle has normal function. The left ventricle has no regional wall motion abnormalities. Left ventricular diastolic parameters are consistent with Grade I diastolic dysfunction (impaired relaxation).  2. Right ventricular systolic function is normal. The right ventricular size is mildly enlarged.  3. Left atrial size was mildly dilated.  4. The mitral valve is normal in structure. Trivial mitral valve regurgitation. No evidence of mitral stenosis.  5. The aortic valve is normal in structure. Aortic valve regurgitation is not visualized. No aortic stenosis is present.  6. The inferior vena cava is normal in size with greater than 50% respiratory variability, suggesting right atrial pressure of 3 mmHg. FINDINGS  Left Ventricle: Left ventricular ejection fraction, by estimation, is 55 to 60%. The  left ventricle has normal function. The left ventricle has no regional wall motion abnormalities. The left ventricular internal cavity size was normal in size. There is  no left ventricular hypertrophy. Left ventricular diastolic parameters are consistent with Grade I diastolic dysfunction (impaired relaxation). Right Ventricle: The right ventricular size is mildly enlarged. No increase in right ventricular wall thickness. Right ventricular systolic function is normal. Left Atrium: Left atrial size was mildly dilated. Right Atrium: Right atrial size was normal in size. Pericardium: There is no evidence of pericardial effusion. Mitral Valve: The mitral valve is normal in structure. Trivial mitral valve regurgitation. No evidence of mitral valve stenosis. MV peak gradient, 2.3 mmHg. The mean mitral valve gradient is 1.0 mmHg. Tricuspid Valve: The tricuspid valve is normal in structure. Tricuspid valve regurgitation is trivial. No evidence of tricuspid stenosis. Aortic Valve: The aortic valve is normal in structure. Aortic valve regurgitation is not visualized. No aortic stenosis is present. Aortic valve mean gradient measures 3.0 mmHg. Aortic valve peak gradient measures 7.0 mmHg. Aortic valve area, by VTI measures 2.80 cm. Pulmonic Valve: The pulmonic valve was normal in structure. Pulmonic valve regurgitation is not visualized. No evidence of pulmonic stenosis. Aorta: The aortic root is normal in size and structure. Venous: The inferior vena cava is normal in size with greater than 50% respiratory variability, suggesting right atrial pressure of 3 mmHg. IAS/Shunts: No atrial level shunt detected by color flow Doppler.  LEFT VENTRICLE PLAX 2D LVIDd:         4.40 cm     Diastology LVIDs:         3.30 cm     LV e' medial:    6.20 cm/s LV PW:         1.10 cm     LV E/e' medial:  9.3 LV IVS:        1.10 cm     LV e' lateral:   7.62 cm/s LVOT diam:     2.00 cm     LV E/e' lateral: 7.6 LV SV:         81 LV SV Index:   40  LVOT Area:     3.14 cm  LV Volumes (MOD) LV vol d, MOD A2C: 42.6 ml LV vol d, MOD A4C: 64.3 ml LV vol s, MOD A2C: 22.0 ml LV vol s, MOD A4C: 29.0 ml LV SV MOD A2C:     20.6 ml LV SV MOD A4C:     64.3 ml LV SV MOD BP:      26.2 ml RIGHT VENTRICLE RV Basal diam:  3.95 cm RV Mid diam:    2.80 cm RV S  prime:     17.00 cm/s TAPSE (M-mode): 3.0 cm LEFT ATRIUM             Index        RIGHT ATRIUM           Index LA diam:        3.90 cm 1.92 cm/m   RA Area:     18.60 cm LA Vol (A2C):   64.2 ml 31.55 ml/m  RA Volume:   54.10 ml  26.59 ml/m LA Vol (A4C):   71.6 ml 35.19 ml/m LA Biplane Vol: 68.2 ml 33.51 ml/m  AORTIC VALVE                    PULMONIC VALVE AV Area (Vmax):    3.07 cm     PV Vmax:       0.71 m/s AV Area (Vmean):   2.66 cm     PV Peak grad:  2.0 mmHg AV Area (VTI):     2.80 cm AV Vmax:           132.00 cm/s AV Vmean:          85.300 cm/s AV VTI:            0.288 m AV Peak Grad:      7.0 mmHg AV Mean Grad:      3.0 mmHg LVOT Vmax:         129.00 cm/s LVOT Vmean:        72.100 cm/s LVOT VTI:          0.257 m LVOT/AV VTI ratio: 0.89  AORTA Ao Root diam: 3.10 cm Ao Asc diam:  3.20 cm MITRAL VALVE MV Area (PHT): 4.21 cm    SHUNTS MV Area VTI:   3.12 cm    Systemic VTI:  0.26 m MV Peak grad:  2.3 mmHg    Systemic Diam: 2.00 cm MV Mean grad:  1.0 mmHg MV Vmax:       0.76 m/s MV Vmean:      33.5 cm/s MV Decel Time: 180 msec MV E velocity: 57.80 cm/s MV A velocity: 75.00 cm/s MV E/A ratio:  0.77 Candee Furbish MD Electronically signed by Candee Furbish MD Signature Date/Time: 02/21/2021/3:25:03 PM    Final      Discharge Exam: Vitals:   03/04/21 0258 03/04/21 1210  BP: (!) 138/57 132/61  Pulse: 79 85  Resp:  14  Temp: 98.7 F (37.1 C) 98.8 F (37.1 C)  SpO2:  95%   Vitals:   03/03/21 2330 03/04/21 0050 03/04/21 0258 03/04/21 1210  BP: 137/83 (!) 150/66 (!) 138/57 132/61  Pulse:  75 79 85  Resp: (!) 21 (!) 24  14  Temp:  98.5 F (36.9 C) 98.7 F (37.1 C) 98.8 F (37.1 C)  TempSrc:  Oral Oral  Oral  SpO2:    95%    General: Pt is alert, awake, not in acute distress Cardiovascular: RRR, S1/S2 +, no rubs, no gallops Respiratory: CTA bilaterally, no wheezing, no rhonchi Abdominal: Soft, NT, ND, bowel sounds + Extremities: no edema, no cyanosis    The results of significant diagnostics from this hospitalization (including imaging, microbiology, ancillary and laboratory) are listed below for reference.     Microbiology: Recent Results (from the past 240 hour(s))  Resp Panel by RT-PCR (Flu A&B, Covid) Nasopharyngeal Swab     Status: None   Collection Time: 03/03/21  9:25 PM   Specimen: Nasopharyngeal Swab; Nasopharyngeal(NP) swabs  in vial transport medium  Result Value Ref Range Status   SARS Coronavirus 2 by RT PCR NEGATIVE NEGATIVE Final    Comment: (NOTE) SARS-CoV-2 target nucleic acids are NOT DETECTED.  The SARS-CoV-2 RNA is generally detectable in upper respiratory specimens during the acute phase of infection. The lowest concentration of SARS-CoV-2 viral copies this assay can detect is 138 copies/mL. A negative result does not preclude SARS-Cov-2 infection and should not be used as the sole basis for treatment or other patient management decisions. A negative result may occur with  improper specimen collection/handling, submission of specimen other than nasopharyngeal swab, presence of viral mutation(s) within the areas targeted by this assay, and inadequate number of viral copies(<138 copies/mL). A negative result must be combined with clinical observations, patient history, and epidemiological information. The expected result is Negative.  Fact Sheet for Patients:  EntrepreneurPulse.com.au  Fact Sheet for Healthcare Providers:  IncredibleEmployment.be  This test is no t yet approved or cleared by the [place] FDA and  has been authorized for detection and/or diagnosis of SARS-CoV-2 by FDA under an Emergency Use  Authorization (EUA). This EUA will remain  in effect (meaning this test can be used) for the duration of the COVID-19 declaration under Section 564(b)(1) of the Act, 21 U.S.C.section 360bbb-3(b)(1), unless the authorization is terminated  or revoked sooner.       Influenza A by PCR NEGATIVE NEGATIVE Final   Influenza B by PCR NEGATIVE NEGATIVE Final    Comment: (NOTE) The Xpert Xpress SARS-CoV-2/FLU/RSV plus assay is intended as an aid in the diagnosis of influenza from Nasopharyngeal swab specimens and should not be used as a sole basis for treatment. Nasal washings and aspirates are unacceptable for Xpert Xpress SARS-CoV-2/FLU/RSV testing.  Fact Sheet for Patients: EntrepreneurPulse.com.au  Fact Sheet for Healthcare Providers: IncredibleEmployment.be  This test is not yet approved or cleared by the [place] FDA and has been authorized for detection and/or diagnosis of SARS-CoV-2 by FDA under an Emergency Use Authorization (EUA). This EUA will remain in effect (meaning this test can be used) for the duration of the COVID-19 declaration under Section 564(b)(1) of the Act, 21 U.S.C. section 360bbb-3(b)(1), unless the authorization is terminated or revoked.  Performed at [facility], [address] [place], [place] [address]      Labs: BNP (last 3 results) No results for input(s): BNP in the last 8760 hours. Basic Metabolic Panel: Recent Labs  Lab 03/03/21 1742 03/03/21 2003 03/04/21 0405  NA 137  --  136  K 5.0  --  4.2  CL 103  --  103  CO2 25  --  23  GLUCOSE 108*  --  101*  BUN 25*  --  17  CREATININE 1.34*  --  1.23  CALCIUM 9.2  --  8.5*  MG  --  1.7  --    Liver Function Tests: Recent Labs  Lab 03/03/21 1750  AST 27  ALT 24  ALKPHOS 53  BILITOT 0.7  PROT 7.3  ALBUMIN 4.4   No results for input(s): LIPASE, AMYLASE in the last 168 hours. No results for input(s): AMMONIA in the last 168 hours. CBC: Recent  Labs  Lab 03/03/21 1742 03/04/21 0405  WBC 9.9 8.2  HGB 13.3 12.3*  HCT 41.1 36.2*  MCV 98.6 94.0  PLT 217 180   Cardiac Enzymes: No results for input(s): CKTOTAL, CKMB, CKMBINDEX, TROPONINI in the last 168 hours. BNP: Invalid input(s): POCBNP CBG: Recent Labs  Lab 03/03/21 1913  GLUCAP 102*   D-Dimer No results for input(s): DDIMER in the last 72 hours. Hgb A1c No results for input(s): HGBA1C in the last 72 hours. Lipid Profile No results for input(s): CHOL, HDL, LDLCALC, TRIG, CHOLHDL, LDLDIRECT in the last 72 hours. Thyroid function studies Recent Labs    03/03/21 1742  TSH 0.843   Anemia work up No results for input(s): VITAMINB12, FOLATE, FERRITIN, TIBC, IRON, RETICCTPCT in the last 72 hours. Urinalysis    Component Value Date/Time   COLORURINE YELLOW 03/03/2021 1910   APPEARANCEUR CLEAR 03/03/2021 1910   LABSPEC 1.017 03/03/2021 1910   PHURINE 5.0 03/03/2021 1910   GLUCOSEU NEGATIVE 03/03/2021 1910   HGBUR NEGATIVE 03/03/2021 1910   BILIRUBINUR NEGATIVE 03/03/2021 1910   KETONESUR NEGATIVE 03/03/2021 1910   PROTEINUR NEGATIVE 03/03/2021 1910   NITRITE NEGATIVE 03/03/2021 1910   LEUKOCYTESUR NEGATIVE 03/03/2021 1910   Sepsis Labs Invalid input(s): PROCALCITONIN,  WBC,  LACTICIDVEN Microbiology Recent Results (from the past 240 hour(s))  Resp Panel by RT-PCR (Flu A&B, Covid) Nasopharyngeal Swab     Status: None   Collection Time: 03/03/21  9:25 PM   Specimen: Nasopharyngeal Swab; Nasopharyngeal(NP) swabs in vial transport medium  Result Value Ref Range Status   SARS Coronavirus 2 by RT PCR NEGATIVE NEGATIVE Final    Comment: (NOTE) SARS-CoV-2 target nucleic acids are NOT DETECTED.  The SARS-CoV-2 RNA is generally detectable in upper respiratory specimens during the acute phase of infection. The lowest concentration of SARS-CoV-2 viral copies this assay can detect is 138 copies/mL. A negative result does not preclude SARS-Cov-2 infection and should  not be used as the sole basis for treatment or other patient management decisions. A negative result may occur with  improper specimen collection/handling, submission of specimen other than nasopharyngeal swab, presence of viral mutation(s) within the areas targeted by this assay, and inadequate number of viral copies(<138 copies/mL). A negative result must be combined with clinical observations, patient history, and epidemiological information. The expected result is Negative.  Fact Sheet for Patients:  EntrepreneurPulse.com.au  Fact Sheet for Healthcare Providers:  IncredibleEmployment.be  This test is no t yet approved or cleared by the [place] FDA and  has been authorized for detection and/or diagnosis of SARS-CoV-2 by FDA under an Emergency Use Authorization (EUA). This EUA will remain  in effect (meaning this test can be used) for the duration of the COVID-19 declaration under Section 564(b)(1) of the Act, 21 U.S.C.section 360bbb-3(b)(1), unless the authorization is terminated  or revoked sooner.       Influenza A by PCR NEGATIVE NEGATIVE Final   Influenza B by PCR NEGATIVE NEGATIVE Final    Comment: (NOTE) The Xpert Xpress SARS-CoV-2/FLU/RSV plus assay is intended as an aid in the diagnosis of influenza from Nasopharyngeal swab specimens and should not be used as a sole basis for treatment. Nasal washings and aspirates are unacceptable for Xpert Xpress SARS-CoV-2/FLU/RSV testing.  Fact Sheet for Patients: EntrepreneurPulse.com.au  Fact Sheet for Healthcare Providers: IncredibleEmployment.be  This test is not yet approved or cleared by the [place] FDA and has been authorized for detection and/or diagnosis of SARS-CoV-2 by FDA under an Emergency Use Authorization (EUA). This EUA will remain in effect (meaning this test can be used) for the duration of the COVID-19 declaration under  Section 564(b)(1) of the Act, 21 U.S.C. section 360bbb-3(b)(1), unless the authorization is terminated or revoked.  Performed at [facility], [address] [place], [place] [address]      Time coordinating  discharge: Over 30 minutes  SIGNED:   Darliss Cheney, MD  [facility] 03/04/2021, 3:14 PM  If 7PM-7AM, please contact night-coverage [email or web address]

## 2021-03-04 NOTE — Evaluation (Signed)
Physical Therapy Evaluation Patient Details Name: Jay [name] MRN: [id number] DOB: [birth year] Today's Date: 03/04/2021  History of Present Illness  Pt. is a 71 y.o. male presenting to [facility] on 2/20 s/p syncopal episdoe with resultant fall and blow to the back of his head, sustained nondisplaced occipital skull fx (no intervention). CT shows minimal traumatic subarachnoid hemorrhage and very small frontal contusions of no significance.  PMH significant for anxiety, arthritis, COPD, depression, GERD, HTN, SOB on exertion.  Clinical Impression  Pt presents with headache and concussion. These impairments are limiting his ability to safely and independently transfer, get into his home, perform all adls/iadls, and ambulate in the community. Pt to benefit from acute PT to address deficits. Functional transfer, gait, high level balance, and gait performed and pt ambulated 300 feet with no assistive device.  Pt. Responded well and reports no increase in symptoms but was limited secondary to HR management secondary to A-fib. HR ranged between 110-130 throughout gait and spiked to 140 afterwards, quickly calming down when sitting. Extensive education performed for concussion protocol and importance of cardiac follow up. Concussion hand out administered to wife. SPT recommends outpatient neuro follow up to manage concussive symptoms. SPT recommends discharge once medically stable following independent level mobility and available home support.         Recommendations for follow up therapy are one component of a multi-disciplinary discharge planning process, led by the attending physician.  Recommendations may be updated based on patient status, additional functional criteria and insurance authorization.  Follow Up Recommendations Outpatient PT (For concussion follow up)    Assistance Recommended at Discharge PRN  Patient can return home with the following  A little help with walking and/or transfers;A little  help with bathing/dressing/bathroom;Assist for transportation    Equipment Recommendations None recommended by PT  Recommendations for Other Services       Functional Status Assessment Patient has had a recent decline in their functional status and demonstrates the ability to make significant improvements in function in a reasonable and predictable amount of time.     Precautions / Restrictions Precautions Precautions: Fall Precaution Comments: Concussion. Extensive education about concussion signs/symptoms and to gradually reintroduce activity. Restrictions Weight Bearing Restrictions: No      Mobility  Bed Mobility Overal bed mobility: Independent             General bed mobility comments: Pt. in chair upon PT arrival    Transfers Overall transfer level: Needs assistance Equipment used: None Transfers: Sit to/from Stand Sit to Stand: Supervision           General transfer comment: Supervision for saftey    Ambulation/Gait Ambulation/Gait assistance: Supervision Gait Distance (Feet): 300 Feet Assistive device: None Gait Pattern/deviations: WFL(Within Functional Limits), Step-through pattern Gait velocity: decreased     General Gait Details: Patient shows no significant gait deficits. 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 Pulse got up to 140 secondary to A fib, bounced between 110 and 130 throughout gait.  Stairs Stairs: Yes Stairs assistance: Supervision Stair Management: One rail Left Number of Stairs: 5 General stair comments: Step-through pattern on way up step-to on way down  Wheelchair Mobility    Modified Rankin (Stroke Patients Only)       Balance Overall balance assessment: Needs assistance Sitting-balance support: Feet supported Sitting balance-Leahy Scale: Good     Standing balance support: During functional activity Standing balance-Leahy Scale: Good  High  level balance activites: Direction changes, Turns, Sudden stops, Head turns High Level Balance Comments: Pt. tolerates all balance challenges well with no reported increase in symptoms, [place], or dizziness. Min weaving of gait with head turns and momentary LOB when stepping over object.             Pertinent Vitals/Pain Pain Assessment Pain Assessment: 0-10 Pain Score: 6  Pain Location: Head Pain Descriptors / Indicators: Aching Pain Intervention(s): Limited activity within patient's tolerance, Monitored during session    Home Living Family/patient expects to be discharged to:: Private residence Living Arrangements: Spouse/significant other Available Help at Discharge: Family Type of Home: House Home Access: Stairs to enter Entrance Stairs-Rails: Left Entrance Stairs-Number of Steps: 4   Home Layout: One level Home Equipment: Grab bars - tub/shower;Rollator (4 wheels)      Prior Function Prior Level of Function : Independent/Modified Independent             Mobility Comments: independent ADLs Comments: independent     Hand Dominance   Dominant Hand: Right    Extremity/Trunk Assessment   Upper Extremity Assessment Upper Extremity Assessment: Defer to OT evaluation    Lower Extremity Assessment Lower Extremity Assessment: Overall WFL for tasks assessed    Cervical / Trunk Assessment Cervical / Trunk Assessment: Normal  Communication   Communication: No difficulties  Cognition Arousal/Alertness: Awake/alert Behavior During Therapy: WFL for tasks assessed/performed Overall Cognitive Status: History of cognitive impairments - at baseline Area of Impairment: Attention, Memory                   Current Attention Level: Selective Memory: Decreased short-term memory         General Comments: Wife states improvement since initial accident, but pt. also with a baseline of mild dementia. Formal cognition not assessed.        General Comments  General comments (skin integrity, edema, etc.): recomment refrain from driving until cleared by MD; educated on energy conservation strategies and pursed lip breathing    Exercises     Assessment/Plan    PT Assessment Patient does not need any further PT services  PT Problem List         PT Treatment Interventions      PT Goals (Current goals can be found in the Care Plan section)  Acute Rehab PT Goals Patient Stated Goal: Reduce head pain and return home. PT Goal Formulation: With patient/family Time For Goal Achievement: 03/04/21 Potential to Achieve Goals: Good    Frequency       Co-evaluation               AM-PAC PT "6 Clicks" Mobility  Outcome Measure Help needed turning from your back to your side while in a flat bed without using bedrails?: None Help needed moving from lying on your back to sitting on the side of a flat bed without using bedrails?: None Help needed moving to and from a bed to a chair (including a wheelchair)?: None Help needed standing up from a chair using your arms (e.g., wheelchair or bedside chair)?: None Help needed to walk in hospital room?: A Little Help needed climbing 3-5 steps with a railing? : A Little 6 Click Score: 22    End of Session   Activity Tolerance: Patient tolerated treatment well Patient left: in chair;with family/visitor present Nurse Communication: Mobility status PT Visit Diagnosis: Other abnormalities of gait and mobility (R26.89);Pain Pain - Right/Left:  (B) Pain - part of body:  (  head)    Time: 3943-2003 PT Time Calculation (min) (ACUTE ONLY): 29 min   Charges:   PT Evaluation $PT Eval Low Complexity: 1 Low PT Treatments $Gait Training: 8-22 mins        Thermon Leyland, SPT Acute Rehab Services   Thermon Leyland 03/04/2021, 4:01 PM

## 2021-03-05 MED FILL — Fentanyl Citrate Preservative Free (PF) Inj 100 MCG/2ML: INTRAMUSCULAR | Qty: 1 | Status: AC

## 2021-03-11 ENCOUNTER — Other Ambulatory Visit: Payer: Self-pay | Admitting: Physician Assistant

## 2021-03-11 DIAGNOSIS — S06361D Traumatic hemorrhage of cerebrum, unspecified, with loss of consciousness of 30 minutes or less, subsequent encounter: Secondary | ICD-10-CM | POA: Diagnosis not present

## 2021-03-11 DIAGNOSIS — R55 Syncope and collapse: Secondary | ICD-10-CM | POA: Diagnosis not present

## 2021-03-11 DIAGNOSIS — I6523 Occlusion and stenosis of bilateral carotid arteries: Secondary | ICD-10-CM | POA: Diagnosis not present

## 2021-03-11 DIAGNOSIS — S02119D Unspecified fracture of occiput, subsequent encounter for fracture with routine healing: Secondary | ICD-10-CM | POA: Diagnosis not present

## 2021-03-11 DIAGNOSIS — S065X1D Traumatic subdural hemorrhage with loss of consciousness of 30 minutes or less, subsequent encounter: Secondary | ICD-10-CM | POA: Diagnosis not present

## 2021-03-11 DIAGNOSIS — Z09 Encounter for follow-up examination after completed treatment for conditions other than malignant neoplasm: Secondary | ICD-10-CM | POA: Diagnosis not present

## 2021-03-11 DIAGNOSIS — S066X1D Traumatic subarachnoid hemorrhage with loss of consciousness of 30 minutes or less, subsequent encounter: Secondary | ICD-10-CM | POA: Diagnosis not present

## 2021-03-14 ENCOUNTER — Ambulatory Visit
Admission: RE | Admit: 2021-03-14 | Discharge: 2021-03-14 | Disposition: A | Discharge status: Home / Self Care | Payer: Medicare HMO | Source: Ambulatory Visit | Attending: Physician Assistant | Admitting: Physician Assistant

## 2021-03-14 DIAGNOSIS — I6523 Occlusion and stenosis of bilateral carotid arteries: Secondary | ICD-10-CM | POA: Diagnosis not present

## 2021-03-24 ENCOUNTER — Other Ambulatory Visit: Payer: Self-pay

## 2021-03-24 ENCOUNTER — Encounter (HOSPITAL_COMMUNITY): Payer: Self-pay | Admitting: Physical Therapy

## 2021-03-24 ENCOUNTER — Ambulatory Visit (HOSPITAL_COMMUNITY): Payer: Medicare HMO | Attending: Family Medicine | Admitting: Physical Therapy

## 2021-03-24 DIAGNOSIS — S0990XA Unspecified injury of head, initial encounter: Secondary | ICD-10-CM | POA: Insufficient documentation

## 2021-03-24 DIAGNOSIS — R2689 Other abnormalities of gait and mobility: Secondary | ICD-10-CM | POA: Insufficient documentation

## 2021-03-24 DIAGNOSIS — I629 Nontraumatic intracranial hemorrhage, unspecified: Secondary | ICD-10-CM | POA: Diagnosis not present

## 2021-03-24 NOTE — Therapy (Signed)
?OUTPATIENT PHYSICAL THERAPY NEURO EVALUATION ? ? ?Patient Name: Jay [name] ?MRN: [id number] ?DOB:July 31, 1950, 71 y.o., male ?Today's Date: 03/24/2021 ? ?PCP: Maury Dus, MD ?REFERRING PROVIDER: Darliss Cheney, MD ? ? PT End of Session - 03/24/21 0800   ? ? Visit Number 1   ? Number of Visits 1   ? Date for PT Re-Evaluation 03/24/21   ? Authorization Type [employer] 1 visit requested   ? PT Start Time [id number]   ? PT Stop Time 0825   ? PT Time Calculation (min) 32 min   ? Equipment Utilized During Treatment Gait belt   ? Activity Tolerance Patient tolerated treatment well   ? Behavior During Therapy [facility] for tasks assessed/performed   ? ?  ?  ? ?  ? ? ?Past Medical History:  ?Diagnosis Date  ? Anxiety   ? Arthritis   ? COPD (chronic obstructive pulmonary disease) ([place])   ? Depression   ? GERD (gastroesophageal reflux disease)   ? Hypertension   ? Shortness of breath   ? with excertion  ? ?Past Surgical History:  ?Procedure Laterality Date  ? APPENDECTOMY    ? EYE SURGERY    ? ?Patient Active Problem List  ? Diagnosis Date Noted  ? Intracranial bleeding ([place]) 03/04/2021  ? Intracranial hemorrhage ([place]) 03/03/2021  ? Syncope and collapse 03/03/2021  ? 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 HTN (hypertension) 03/03/2021  ? COPD (chronic obstructive pulmonary disease) ([place]) 03/03/2021  ? Depression with anxiety 03/03/2021  ? ? ?ONSET DATE: 03/03/21 ? ?REFERRING DIAG: S09.90XA (ICD-10-CM) - Injury of head, initial encounter I62.9 (ICD-10-CM) - Intracranial bleeding ([place])  ? ?THERAPY DIAG:  ?Other abnormalities of gait and mobility ? ?Injury of head, initial encounter ? ?SUBJECTIVE:  ?                                                                                                                                                                                           ? ?SUBJECTIVE STATEMENT: ?Patient states he had a head injury. Patient passed out while cleaning garage on 03/03/21. He still has some dizziness and headache and  head feels swollen. He states his balance is off. He has been having neck pain ?Pt accompanied by: self ? ?PERTINENT HISTORY: Jay [name] is a 71 y.o. male with medical history significant for COPD, arthritis, depression and anxiety, hypertension. Blind L eye. ? ?PAIN:  ?Are you having pain? Yes: NPRS scale: 5/10 ?Pain location: head ?Pain description: swollen ?Aggravating factors: constant ?Relieving factors: laying ? ?PRECAUTIONS: Fall ? ?WEIGHT BEARING RESTRICTIONS No ? ?FALLS: Has patient fallen in last 6 months? Yes,  Number of falls: 1 ? ?LIVING ENVIRONMENT: ?Lives with: lives with their spouse ?Lives in: House/apartment ?Stairs: Yes; External: 5 steps; on left going up ?Has following equipment at home: Gilford Rile - 2 wheeled ? ?PLOF: Independent with basic ADLs ? ?PATIENT GOALS Improve balance and mobility ? ?OBJECTIVE:  ? ?DIAGNOSTIC FINDINGS: CT IMPRESSION: ?1. Small volume of layering acute hemorrhage in the left lateral ?ventricle, slightly increased in volume/conspicuity. No ?hydrocephalus. ?2. Similar appearance of suspected bilateral inferior frontal lobe ?contusions with possible intraparenchymal hemorrhage inferiorly on ?the left and overlying subarachnoid hemorrhage. ?3. Similar small volume of extra-axial hemorrhage more superiorly ?along the anterior right falx, probably subdural. ?4. Redemonstrated nondisplaced right occipital bone fracture ? ?COGNITION: ?Overall cognitive status: Within functional limits for tasks assessed ?  ?SENSATION: ?Light touch: WFL ? ?Vision ? Decreased L visual field due to L eye blindness ?Smooth pursuits- WFL on R ? ? ? ?POSTURE: rounded shoulders and forward head ? ?LE ROM:   WFL ? ?Active  Right ?03/24/2021 Left ?03/24/2021  ?Hip flexion    ?Hip extension    ?Hip abduction    ?Hip adduction    ?Hip internal rotation    ?Hip external rotation    ?Knee flexion    ?Knee extension    ?Ankle dorsiflexion    ?Ankle plantarflexion    ?Ankle inversion    ?Ankle eversion    ?  (Blank rows = not tested) ? ?MMT:   ? ?MMT Right ?03/24/2021 Left ?03/24/2021  ?Hip flexion 5/5 5/5  ?Hip extension    ?Hip abduction    ?Hip adduction    ?Hip internal rotation    ?Hip external rotation    ?Knee flexion 5/5 5/5  ?Knee extension 5/5 5/5  ?Ankle dorsiflexion 5/5 5/5  ?Ankle plantarflexion    ?Ankle inversion    ?Ankle eversion    ?(Blank rows = not tested) ? ? ?TRANSFERS: ?Assistive device utilized: None  ?Sit to stand: Complete Independence ?Stand to sit: Complete Independence ? ? ? ? ?STAIRS: ? Level of Assistance: SBA ? Stair Negotiation Technique: Alternating Pattern  with Single Rail on Left ? Number of Stairs: 4  ? Height of Stairs: 7  ?Comments: slightly unsteady ? ?GAIT: ?Gait pattern: step through pattern ?Distance walked: 355 ?Assistive device utilized: None ?Level of assistance: Modified independence ?Comments: 2MWT minimally unsteady; slight "pounding" in head following 2MWT and balance testing ? ?FUNCTIONAL TESTs:  ?5 times sit to stand: 5x STS: 19.88 seconds without UE use ?Dynamic Gait Index: 19/24 ? ? ?TODAY'S TREATMENT:  ?Education ? ? ?PATIENT EDUCATION: ?Education details: exam findings, POC, scope of PT, returning to PT if needed, gradual increase in activity, using AD PRN ?Person educated: Patient ?Education method: Explanation and Demonstration ?Education comprehension: verbalized understanding ? ? ?HOME EXERCISE PROGRAM: ?N/a ? ?ASSESSMENT: ? ?CLINICAL IMPRESSION: ?Patient is a 71 y.o. Male who was seen today for physical therapy evaluation and treatment for a recent head injury. Patient with gait and balance deficit along with head symptoms. Discussed exam findings with patient and he wishes to not attend PT for financial reasons. He is educated on returning if needed and he does not require additional PT services at this time.  ? ? ?OBJECTIVE IMPAIRMENTS Abnormal gait, decreased activity tolerance, decreased balance, decreased endurance, decreased mobility, difficulty walking,  and decreased strength.  ? ?ACTIVITY LIMITATIONS cleaning, community activity, laundry, yard work, shopping, personal finances, and yard work.  ? ?PERSONAL FACTORS Age, Fitness, Past/current experiences, and Time since onset of injury/illness/exacerbation are also  affecting patient's functional outcome.  ? ? ?REHAB POTENTIAL: Good ? ?CLINICAL DECISION MAKING: Stable/uncomplicated ? ?EVALUATION COMPLEXITY: Low ? ? ?GOALS: ?Goals reviewed with patient? Yes ? ?SHORT TERM GOALS: Target date:  03/24/21 ? ?Patient will be educated on exam findings, POC, scope of PT, concussion symptoms, returning to PT if needed ? ?Goal status: MET ? ? ?PLAN: ?PT FREQUENCY: one time visit ? ?PT DURATION: other: one time visit ? ?PLANNED INTERVENTIONS: Therapeutic exercises, Therapeutic activity, Neuromuscular re-education, Balance training, Gait training, Patient/Family education, Stair training, Vestibular training, and Manual therapy ? ?PLAN FOR NEXT SESSION: n/a ? ? ?8:30 AM, 03/24/21 ?Mearl Latin PT, DPT ?Physical Therapist at [facility] ?[facility] ? ? ? ? ? ? ?  ?

## 2021-03-27 DIAGNOSIS — S069X0A Unspecified intracranial injury without loss of consciousness, initial encounter: Secondary | ICD-10-CM | POA: Diagnosis not present

## 2021-03-27 DIAGNOSIS — R55 Syncope and collapse: Secondary | ICD-10-CM | POA: Diagnosis not present

## 2021-04-10 DIAGNOSIS — I129 Hypertensive chronic kidney disease with stage 1 through stage 4 chronic kidney disease, or unspecified chronic kidney disease: Secondary | ICD-10-CM | POA: Diagnosis not present

## 2021-04-10 DIAGNOSIS — E785 Hyperlipidemia, unspecified: Secondary | ICD-10-CM | POA: Diagnosis not present

## 2021-04-10 DIAGNOSIS — F324 Major depressive disorder, single episode, in partial remission: Secondary | ICD-10-CM | POA: Diagnosis not present

## 2021-04-10 DIAGNOSIS — J449 Chronic obstructive pulmonary disease, unspecified: Secondary | ICD-10-CM | POA: Diagnosis not present

## 2021-04-10 DIAGNOSIS — N1831 Chronic kidney disease, stage 3a: Secondary | ICD-10-CM | POA: Diagnosis not present

## 2021-05-02 ENCOUNTER — Emergency Department (HOSPITAL_COMMUNITY): Payer: Medicare HMO

## 2021-05-02 ENCOUNTER — Emergency Department (HOSPITAL_COMMUNITY)
Admission: EM | Admit: 2021-05-02 | Discharge: 2021-05-02 | Disposition: A | Discharge status: Home / Self Care | Payer: Medicare HMO | Attending: Emergency Medicine | Admitting: Emergency Medicine

## 2021-05-02 ENCOUNTER — Encounter (HOSPITAL_COMMUNITY): Payer: Self-pay | Admitting: *Deleted

## 2021-05-02 ENCOUNTER — Other Ambulatory Visit: Payer: Self-pay

## 2021-05-02 DIAGNOSIS — R0602 Shortness of breath: Secondary | ICD-10-CM | POA: Insufficient documentation

## 2021-05-02 DIAGNOSIS — Z7982 Long term (current) use of aspirin: Secondary | ICD-10-CM | POA: Insufficient documentation

## 2021-05-02 DIAGNOSIS — R519 Headache, unspecified: Secondary | ICD-10-CM | POA: Diagnosis not present

## 2021-05-02 DIAGNOSIS — R7989 Other specified abnormal findings of blood chemistry: Secondary | ICD-10-CM | POA: Insufficient documentation

## 2021-05-02 DIAGNOSIS — Z8679 Personal history of other diseases of the circulatory system: Secondary | ICD-10-CM | POA: Diagnosis not present

## 2021-05-02 DIAGNOSIS — I4891 Unspecified atrial fibrillation: Secondary | ICD-10-CM | POA: Diagnosis not present

## 2021-05-02 DIAGNOSIS — I499 Cardiac arrhythmia, unspecified: Secondary | ICD-10-CM | POA: Diagnosis not present

## 2021-05-02 DIAGNOSIS — R42 Dizziness and giddiness: Secondary | ICD-10-CM | POA: Diagnosis not present

## 2021-05-02 DIAGNOSIS — R531 Weakness: Secondary | ICD-10-CM | POA: Diagnosis not present

## 2021-05-02 DIAGNOSIS — R9431 Abnormal electrocardiogram [ECG] [EKG]: Secondary | ICD-10-CM | POA: Diagnosis present

## 2021-05-02 LAB — TROPONIN I (HIGH SENSITIVITY)
Troponin I (High Sensitivity): 4 ng/L (ref <18)
Troponin I (High Sensitivity): 5 ng/L (ref <18)

## 2021-05-02 LAB — CBC
HCT: 43 % (ref 39.0–52.0)
Hemoglobin: 14.3 g/dL (ref 13.0–17.0)
MCH: 31.4 pg (ref 26.0–34.0)
MCHC: 33.3 g/dL (ref 30.0–36.0)
MCV: 94.3 fL (ref 80.0–100.0)
Platelets: 226 10*3/uL (ref 150–400)
RBC: 4.56 MIL/uL (ref 4.22–5.81)
RDW: 13 % (ref 11.5–15.5)
WBC: 8.2 10*3/uL (ref 4.0–10.5)
nRBC: 0 % (ref 0.0–0.2)

## 2021-05-02 LAB — MAGNESIUM: Magnesium: 1.6 mg/dL — ABNORMAL LOW (ref 1.7–2.4)

## 2021-05-02 LAB — TSH: TSH: 1.821 u[IU]/mL (ref 0.350–4.500)

## 2021-05-02 LAB — BASIC METABOLIC PANEL
Anion gap: 10 (ref 5–15)
BUN: 37 mg/dL — ABNORMAL HIGH (ref 8–23)
CO2: 20 mmol/L — ABNORMAL LOW (ref 22–32)
Calcium: 9.3 mg/dL (ref 8.9–10.3)
Chloride: 108 mmol/L (ref 98–111)
Creatinine, Ser: 1.5 mg/dL — ABNORMAL HIGH (ref 0.61–1.24)
GFR, Estimated: 50 mL/min — ABNORMAL LOW (ref >60)
Glucose, Bld: 92 mg/dL (ref 70–99)
Potassium: 4.5 mmol/L (ref 3.5–5.1)
Sodium: 138 mmol/L (ref 135–145)

## 2021-05-02 MED ORDER — APIXABAN 5 MG PO TABS: 5.0000 mg | ORAL_TABLET | Freq: Two times a day (BID) | ORAL | 0 refills | Status: DC

## 2021-05-02 MED ORDER — APIXABAN 5 MG PO TABS
5.0000 mg | ORAL_TABLET | Freq: Once | ORAL | Status: AC
  Administered 2021-05-02: 5 mg via ORAL
  Filled 2021-05-02: qty 1

## 2021-05-02 MED ORDER — MAGNESIUM SULFATE 2 GM/50ML IV SOLN
2.0000 g | Freq: Once | INTRAVENOUS | Status: AC
  Administered 2021-05-02: 2 g via INTRAVENOUS
  Filled 2021-05-02: qty 50

## 2021-05-02 NOTE — ED Triage Notes (Signed)
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

## 2021-05-02 NOTE — Discharge Instructions (Addendum)
You were seen in the emergency department for an abnormal EKG.  Your work-up showed you to be in atrial fibrillation.  This increases your risk of stroke and so we are starting you on a blood thinner.  Neurosurgery agrees that it is okay in the setting of your recent head bleed.  Please contact Dr. Nelly Laurence office to see if he wants to see you sooner than May.  Return if any worsening or concerning symptoms ?

## 2021-05-02 NOTE — ED Notes (Signed)
Patient transported to CT 

## 2021-05-02 NOTE — ED Provider Notes (Signed)
?[facility] ?Provider Note ? ? ?CSN: [id number] ?Arrival date & time: 05/02/21  1702 ? ?  ? ?History ? ?Chief Complaint  ?Patient presents with  ? Abnormal ECG  ? ? ?Jay [name] is a 71 y.o. male.  He is here for evaluation after he saw his primary care doctor today and was told he needed to come to the emergency department.  He had a fall a couple of months ago which caused some intracranial hemorrhage.  Since then he said he has not felt well with intermittent shortness of breath and feeling fatigued.  He said he did have a cardiac work-up in the past that involved an echo and a cardiac monitor.  He denies any chest pain. ? ?The history is provided by the patient and the spouse.  ?Weakness ?Severity:  Moderate ?Onset quality:  Gradual ?Duration:  8 weeks ?Timing:  Intermittent ?Progression:  Unchanged ?Chronicity:  New ?Relieved by:  Nothing ?Worsened by:  Activity ?Ineffective treatments:  Rest ?Associated symptoms: dizziness and shortness of breath   ?Associated symptoms: no abdominal pain, no chest pain, no fever, no foul-smelling urine and no loss of consciousness   ? ?  ? ?Home Medications ?Prior to Admission medications   ?Medication Sig Start Date End Date Taking? Authorizing Provider  ?acetaminophen (TYLENOL) 500 MG tablet Take 1,000 mg by mouth every 6 (six) hours as needed for moderate pain.    [provider]  ?albuterol (VENTOLIN HFA) 108 (90 Base) MCG/ACT inhaler Inhale 2 puffs into the lungs every 6 (six) hours as needed for wheezing or shortness of breath.    [provider]  ?ALPRAZolam Duanne Moron) 0.5 MG tablet Take 0.5 mg by mouth as needed. 12/31/20   [provider]  ?amLODipine (NORVASC) 5 MG tablet Take 5 mg by mouth every morning. 12/17/20   [provider]  ?aspirin 81 MG tablet Take 1 tablet (81 mg total) by mouth daily. 03/18/21   Darliss Cheney, MD  ?atorvastatin (LIPITOR) 20 MG tablet Take 20 mg by mouth daily.    [provider]  ?cyclobenzaprine (FLEXERIL) 10 MG tablet Take 5-10 mg by mouth at bedtime as needed. 12/03/20   [provider]  ?donepezil (ARICEPT) 10 MG tablet Take 10 mg by mouth at bedtime. 11/28/20   [provider]  ?escitalopram (LEXAPRO) 20 MG tablet Take 20 mg by mouth 2 (two) times daily.    [provider]  ?esomeprazole (NEXIUM) 40 MG capsule Take 40 mg by mouth daily before breakfast.    [provider]  ?famotidine (PEPCID) 20 MG tablet Take 20 mg by mouth at bedtime. 11/28/20   [provider]  ?HYDROcodone-acetaminophen (NORCO/VICODIN) 5-325 MG tablet Take 0.5-1 tablets by mouth every 6 (six) hours as needed. 01/15/21   [provider]  ?lamoTRIgine (LAMICTAL) 200 MG tablet Take 200 mg by mouth daily.    [provider]  ?lisinopril-hydrochlorothiazide (ZESTORETIC) 20-12.5 MG tablet Take 1 tablet by mouth every morning. 12/17/20   [provider]  ?Multiple Vitamin (MULTIVITAMIN) tablet Take 1 tablet by mouth daily.    [provider]  ?OVER THE COUNTER MEDICATION Cbd wellness gummies    [provider]  ?[facility] injection  12/03/20   [provider]  ?vitamin B-12 (CYANOCOBALAMIN) 500 MCG tablet Take 500 mcg by mouth daily.    [provider]  ?   ? ?Allergies    ?Patient has no known allergies.   ? ?Review of Systems   ?  Review of Systems  ?Constitutional:  Negative for fever.  ?Eyes:  Positive for visual disturbance (chronic blind left eye).  ?Respiratory:  Positive for shortness of breath.   ?Cardiovascular:  Negative for chest pain.  ?Gastrointestinal:  Negative for abdominal pain.  ?Neurological:  Positive for dizziness and weakness. Negative for loss of consciousness.  ? ?Physical Exam ?Updated Vital Signs ?BP 120/74   Pulse 85   Temp 97.9 ?F (36.6 ?C) (Oral)   Resp (!) 21   Ht '5\' 9"'$  (1.753 m)   SpO2 97%   BMI 28.50 kg/m?  ?Physical Exam ?Vitals and nursing note reviewed.  ?Constitutional:   ?    General: He is not in acute distress. ?   Appearance: Normal appearance. He is well-developed.  ?HENT:  ?   Head: Normocephalic and atraumatic.  ?Eyes:  ?   Conjunctiva/sclera: Conjunctivae normal.  ?Cardiovascular:  ?   Rate and Rhythm: Normal rate. Rhythm irregular.  ?   Heart sounds: No murmur heard. ?Pulmonary:  ?   Effort: Pulmonary effort is normal. No respiratory distress.  ?   Breath sounds: Normal breath sounds.  ?Abdominal:  ?   Palpations: Abdomen is soft.  ?   Tenderness: There is no abdominal tenderness. There is no guarding or rebound.  ?Musculoskeletal:     ?   General: No swelling.  ?   Cervical back: Neck supple.  ?   Right lower leg: No edema.  ?   Left lower leg: No edema.  ?Skin: ?   General: Skin is warm and dry.  ?   Capillary Refill: Capillary refill takes less than 2 seconds.  ?Neurological:  ?   General: No focal deficit present.  ?   Mental Status: He is alert.  ?   Sensory: No sensory deficit.  ?   Motor: No weakness.  ?   Gait: Gait normal.  ? ? ?ED Results / Procedures / Treatments   ?Labs ?(all labs ordered are listed, but only abnormal results are displayed) ?Labs Reviewed  ?BASIC METABOLIC PANEL - Abnormal; Notable for the following components:  ?    Result Value  ? CO2 20 (*)   ? BUN 37 (*)   ? Creatinine, Ser 1.50 (*)   ? GFR, Estimated 50 (*)   ? All other components within normal limits  ?MAGNESIUM - Abnormal; Notable for the following components:  ? Magnesium 1.6 (*)   ? All other components within normal limits  ?CBC  ?TSH  ?TROPONIN I (HIGH SENSITIVITY)  ?TROPONIN I (HIGH SENSITIVITY)  ? ? ?EKG ?EKG Interpretation ? ?Date/Time:  Friday May 02 2021 17:11:03 EDT ?Ventricular Rate:  97 ?PR Interval:    ?QRS Duration: 86 ?QT Interval:  322 ?QTC Calculation: 408 ?R Axis:   49 ?Text Interpretation: Atrial fibrillation with premature ventricular or aberrantly conducted complexes Nonspecific T wave abnormality Abnormal ECG When compared with ECG of 03-Mar-2021 18:49, afib is new  Confirmed by Aletta Edouard [id number]) on 05/02/2021 5:21:11 PM ? ?Radiology ?DG Chest 2 View ? ?Result Date: 05/02/2021 ?CLINICAL DATA:  Chronic shortness of breath, new onset atrial fibrillation EXAM: CHEST - 2 VIEW COMPARISON:  03/03/2021 FINDINGS: Cardiac size is within normal limits. There are no signs of pulmonary edema or focal pulmonary consolidation. Small linear densities in the lower lung fields have not changed, possibly scarring. There is no pleural effusion or pneumothorax. IMPRESSION: No active cardiopulmonary disease. Electronically Signed   By: Elmer Picker M.D.   On: 05/02/2021 17:33   ? ?  Procedures ?Procedures  ? ? ?Medications Ordered in ED ?Medications  ?magnesium sulfate IVPB 2 g 50 mL (has no administration in time range)  ? ? ?ED Course/ Medical Decision Making/ A&P ?Clinical Course as of 05/03/21 1000  ?Fri May 02, 2021  ?2006 Discussed with neurosurgery Dr. Trenton Gammon through the nurse while he was in the operating room.  He felt it was okay for the patient to be anticoagulated. [MB]  ?2136 Patient in new onset A-fib. [MB]  ?2137 Reviewed with pharmacy, they recommend starting the patient on 5 of Eliquis twice daily [MB]  ?  ?Clinical Course User Index ?[MB] Hayden Rasmussen, MD  ? ?                        ?Medical Decision Making ?Amount and/or Complexity of Data Reviewed ?Labs: ordered. ?Radiology: ordered. ? ?Risk ?Prescription drug management. ? ?This patient complains of increased shortness of breath dyspnea on exertion, nonspecific malaise; this involves an extensive number of treatment ?Options and is a complaint that carries with it a high risk of complications and ?morbidity. The differential includes arrhythmia, pneumonia, CHF, ACS, dehydration, anemia, metabolic derangement ? ?I ordered, reviewed and interpreted labs, which included CBC with normal white count normal hemoglobin, chemistries normal other than low bicarb slightly elevated creatinine, magnesium low, troponins flat,  TSH normal ?I ordered medication IV magnesium repletion, oral anticoagulation and reviewed PMP when indicated. ?I ordered imaging studies which included chest x-ray, CT head and I independently ?   visualized

## 2021-05-19 ENCOUNTER — Encounter: Payer: Self-pay | Admitting: Cardiology

## 2021-05-19 ENCOUNTER — Ambulatory Visit: Payer: Medicare HMO | Admitting: Cardiology

## 2021-05-19 VITALS — BP 110/64 | HR 92 | Ht 69.0 in | Wt 200.7 lb

## 2021-05-19 DIAGNOSIS — R55 Syncope and collapse: Secondary | ICD-10-CM

## 2021-05-19 DIAGNOSIS — I4891 Unspecified atrial fibrillation: Secondary | ICD-10-CM | POA: Diagnosis not present

## 2021-05-19 DIAGNOSIS — I493 Ventricular premature depolarization: Secondary | ICD-10-CM | POA: Diagnosis not present

## 2021-05-19 NOTE — Patient Instructions (Addendum)
Medication Instructions:  ?Your physician recommends that you continue on your current medications as directed. Please refer to the Current Medication list given to you today. ? ?Labwork: ?none ? ?Testing/Procedures: ?Your physician has recommended that you wear an event monitor for 30 days. Event monitors are medical devices that record the heart?s electrical activity. Doctors most often [place] these monitors to diagnose arrhythmias. Arrhythmias are problems with the speed or rhythm of the heartbeat. The monitor is a small, portable device. You can wear one while you do your normal daily activities. This is usually used to diagnose what is causing palpitations/syncope (passing out). [employer] will mail monitor to you. ? ?Follow-Up: ?Your physician recommends that you schedule a follow-up appointment in: 3 months ? ?Any Other Special Instructions Will Be Listed Below (If Applicable). ? ?If you need a refill on your cardiac medications before your next appointment, please call your pharmacy. ?

## 2021-05-19 NOTE — Progress Notes (Signed)
? ? ? ?Clinical Summary ?Mr. Don is a 71 y.o.male seen today as a new patient for the following medical problems. Focused visit on new diagnosis of afib and recent episode of syncoe ?  ? ? ?1. Syncope ?- admit 02/2021  ?- was in garage, no prodrome. Next thing he remembers if getting up off the floor.  ?- isolated episode in the past of fall with bending over ?- CT showed small ICH managed conservatively.  ? ?2. Afib ?- new diagnosis 05/02/21 ?- from ER note they discussed with neurosurgery anticoag, was cleared to start ?- infrequent palpitations.  ? ?3. Carotid US ?08/8414 RICA 60-63%, LICA 0-16%. BP discrepnacy right side suggesting right inonimate artery or subclavian stenosis, vertebral flow was normal ? ? ?Past Medical History:  ?Diagnosis Date  ? Anxiety   ? Arthritis   ? COPD (chronic obstructive pulmonary disease) ([place])   ? Depression   ? GERD (gastroesophageal reflux disease)   ? Hypertension   ? Shortness of breath   ? with excertion  ? ? ? ?No Known Allergies ? ? ?Current Outpatient Medications  ?Medication Sig Dispense Refill  ? acetaminophen (TYLENOL) 500 MG tablet Take 1,000 mg by mouth every 6 (six) hours as needed for moderate pain.    ? albuterol (VENTOLIN HFA) 108 (90 Base) MCG/ACT inhaler Inhale 2 puffs into the lungs every 6 (six) hours as needed for wheezing or shortness of breath.    ? ALPRAZolam (XANAX) 0.5 MG tablet Take 0.5 mg by mouth as needed.    ? amLODipine (NORVASC) 5 MG tablet Take 5 mg by mouth every morning.    ? apixaban (ELIQUIS) 5 MG TABS tablet Take 1 tablet (5 mg total) by mouth 2 (two) times daily. 60 tablet 0  ? aspirin 81 MG tablet Take 1 tablet (81 mg total) by mouth daily. 30 tablet 0  ? atorvastatin (LIPITOR) 20 MG tablet Take 20 mg by mouth daily.    ? cyclobenzaprine (FLEXERIL) 10 MG tablet Take 5-10 mg by mouth at bedtime as needed.    ? donepezil (ARICEPT) 10 MG tablet Take 10 mg by mouth at bedtime.    ? escitalopram (LEXAPRO) 20 MG tablet Take 20 mg by mouth 2  (two) times daily.    ? esomeprazole (NEXIUM) 40 MG capsule Take 40 mg by mouth daily before breakfast.    ? famotidine (PEPCID) 20 MG tablet Take 20 mg by mouth at bedtime.    ? HYDROcodone-acetaminophen (NORCO/VICODIN) 5-325 MG tablet Take 0.5-1 tablets by mouth every 6 (six) hours as needed.    ? lamoTRIgine (LAMICTAL) 200 MG tablet Take 200 mg by mouth daily.    ? lisinopril-hydrochlorothiazide (ZESTORETIC) 20-12.5 MG tablet Take 1 tablet by mouth every morning.    ? Multiple Vitamin (MULTIVITAMIN) tablet Take 1 tablet by mouth daily.    ? OVER THE COUNTER MEDICATION Cbd wellness gummies    ? SHINGRIX injection     ? vitamin B-12 (CYANOCOBALAMIN) 500 MCG tablet Take 500 mcg by mouth daily.    ? ?No current facility-administered medications for this visit.  ? ? ? ?Past Surgical History:  ?Procedure Laterality Date  ? APPENDECTOMY    ? EYE SURGERY    ? ? ? ?No Known Allergies ? ? ? ?Family History  ?Problem Relation Age of Onset  ? Hypertension Mother   ? Cancer Father   ? Hypertension Father   ? Hypertension Sister   ? Cancer Brother   ? Hypertension Brother   ? ? ? ?  Social History ?Mr. Nguyen reports that he has quit smoking. He has never used smokeless tobacco. ?Mr. Vanzanten reports no history of alcohol use. ? ? ?Review of Systems ?CONSTITUTIONAL: No weight loss, fever, chills, weakness or fatigue.  ?HEENT: Eyes: No visual loss, blurred vision, double vision or yellow sclerae.No hearing loss, sneezing, congestion, runny nose or sore throat.  ?SKIN: No rash or itching.  ?CARDIOVASCULAR: per hpi ?RESPIRATORY: No shortness of breath, cough or sputum.  ?GASTROINTESTINAL: No anorexia, nausea, vomiting or diarrhea. No abdominal pain or blood.  ?GENITOURINARY: No burning on urination, no polyuria ?NEUROLOGICAL: No headache, dizziness, syncope, paralysis, ataxia, numbness or tingling in the extremities. No change in bowel or bladder control.  ?MUSCULOSKELETAL: No muscle, back pain, joint pain or stiffness.  ?LYMPHATICS:  No enlarged nodes. No history of splenectomy.  ?PSYCHIATRIC: No history of depression or anxiety.  ?ENDOCRINOLOGIC: No reports of sweating, cold or heat intolerance. No polyuria or polydipsia.  ?. ? ? ?Physical Examination ?Today's Vitals  ? 05/19/21 6767  ?BP: 110/64  ?Pulse: 92  ?SpO2: 96%  ?Weight: 200 lb 11 oz (91 kg)  ?Height: '5\' 9"'$  (1.753 m)  ? ?Body mass index is 29.64 kg/m?. ? ?Gen: resting comfortably, no acute distress ?HEENT: no scleral icterus, pupils equal round and reactive, no palptable cervical adenopathy,  ?CV: irreg, no m/r/g ?Resp: Clear to auscultation bilaterally ?GI: abdomen is soft, non-tender, non-distended, normal bowel sounds, no hepatosplenomegaly ?MSK: extremities are warm, no edema.  ?Skin: warm, no rash ?Neuro:  no focal deficits ?Psych: appropriate affect ? ? ?Diagnostic Studies ? ?02/2021 echo  ? 1. Left ventricular ejection fraction, by estimation, is 55 to 60%. The  ?left ventricle has normal function. The left ventricle has no regional  ?wall motion abnormalities. Left ventricular diastolic parameters are  ?consistent with Grade I diastolic  ?dysfunction (impaired relaxation).  ? 2. Right ventricular systolic function is normal. The right ventricular  ?size is mildly enlarged.  ? 3. Left atrial size was mildly dilated.  ? 4. The mitral valve is normal in structure. Trivial mitral valve  ?regurgitation. No evidence of mitral stenosis.  ? 5. The aortic valve is normal in structure. Aortic valve regurgitation is  ?not visualized. No aortic stenosis is present.  ? 6. The inferior vena cava is normal in size with greater than 50%  ?respiratory variability, suggesting right atrial pressure of 3 mmHg.  ? ?Assessment and Plan  ? ?1.Afib ?- new diagnosis, has been started on eliquis ?- rates 90s, has not been on av nodal agent. Planning 30 day monitor for syncope, pending rates may warrant av nodal agent ?- asymptomatic, at this time do not see strong indicuation to pursue DCCV ? ?2.  Syncope ?- unclear cause, no prodrome raises concern about possible arrhythmia ?- plan 30 day monitor ? ?3. Carotid stenosis ?- continue to monitor ?- bp difference in arms, need to check bp in left arm. Do not see velocities of subclavians were measured, perhaps right inonimate vs subclavian stenosis. May consider additional imaging at f/u ?  ? ? ? ? ?Arnoldo Lenis, M.D. ?

## 2021-05-20 DIAGNOSIS — I4891 Unspecified atrial fibrillation: Secondary | ICD-10-CM | POA: Diagnosis not present

## 2021-05-20 DIAGNOSIS — D6869 Other thrombophilia: Secondary | ICD-10-CM | POA: Diagnosis not present

## 2021-05-20 DIAGNOSIS — L299 Pruritus, unspecified: Secondary | ICD-10-CM | POA: Diagnosis not present

## 2021-05-20 DIAGNOSIS — F329 Major depressive disorder, single episode, unspecified: Secondary | ICD-10-CM | POA: Diagnosis not present

## 2021-05-20 DIAGNOSIS — I10 Essential (primary) hypertension: Secondary | ICD-10-CM | POA: Diagnosis not present

## 2021-05-20 DIAGNOSIS — R5383 Other fatigue: Secondary | ICD-10-CM | POA: Diagnosis not present

## 2021-05-20 DIAGNOSIS — M542 Cervicalgia: Secondary | ICD-10-CM | POA: Diagnosis not present

## 2021-05-27 ENCOUNTER — Telehealth: Payer: Self-pay | Admitting: Cardiology

## 2021-05-27 NOTE — Telephone Encounter (Signed)
Wife called to say they havent received the heart monitor. Please advise  ?

## 2021-05-27 NOTE — Telephone Encounter (Signed)
Mailbox is full.

## 2021-05-29 NOTE — Telephone Encounter (Signed)
Patient given number to [employer] [phone]) to call to inform them he does want to do this monitor.  Looks like [employer] has made several attempts to contact patient without success then cancelled monitor.

## 2021-06-03 ENCOUNTER — Ambulatory Visit (INDEPENDENT_AMBULATORY_CARE_PROVIDER_SITE_OTHER): Payer: Medicare HMO

## 2021-06-03 ENCOUNTER — Ambulatory Visit: Payer: Medicare HMO | Admitting: Cardiology

## 2021-06-03 DIAGNOSIS — I129 Hypertensive chronic kidney disease with stage 1 through stage 4 chronic kidney disease, or unspecified chronic kidney disease: Secondary | ICD-10-CM | POA: Diagnosis not present

## 2021-06-03 DIAGNOSIS — E7800 Pure hypercholesterolemia, unspecified: Secondary | ICD-10-CM | POA: Diagnosis not present

## 2021-06-03 DIAGNOSIS — R55 Syncope and collapse: Secondary | ICD-10-CM | POA: Diagnosis not present

## 2021-06-03 DIAGNOSIS — F3181 Bipolar II disorder: Secondary | ICD-10-CM | POA: Diagnosis not present

## 2021-06-03 DIAGNOSIS — K219 Gastro-esophageal reflux disease without esophagitis: Secondary | ICD-10-CM | POA: Diagnosis not present

## 2021-06-03 DIAGNOSIS — G2581 Restless legs syndrome: Secondary | ICD-10-CM | POA: Diagnosis not present

## 2021-06-03 DIAGNOSIS — H5440 Blindness, one eye, unspecified eye: Secondary | ICD-10-CM | POA: Diagnosis not present

## 2021-06-03 DIAGNOSIS — Z125 Encounter for screening for malignant neoplasm of prostate: Secondary | ICD-10-CM | POA: Diagnosis not present

## 2021-06-03 DIAGNOSIS — Z0000 Encounter for general adult medical examination without abnormal findings: Secondary | ICD-10-CM | POA: Diagnosis not present

## 2021-06-03 DIAGNOSIS — G300 Alzheimer's disease with early onset: Secondary | ICD-10-CM | POA: Diagnosis not present

## 2021-06-03 DIAGNOSIS — J449 Chronic obstructive pulmonary disease, unspecified: Secondary | ICD-10-CM | POA: Diagnosis not present

## 2021-06-10 DIAGNOSIS — K219 Gastro-esophageal reflux disease without esophagitis: Secondary | ICD-10-CM | POA: Diagnosis not present

## 2021-06-10 DIAGNOSIS — N1831 Chronic kidney disease, stage 3a: Secondary | ICD-10-CM | POA: Diagnosis not present

## 2021-06-10 DIAGNOSIS — E785 Hyperlipidemia, unspecified: Secondary | ICD-10-CM | POA: Diagnosis not present

## 2021-06-10 DIAGNOSIS — J449 Chronic obstructive pulmonary disease, unspecified: Secondary | ICD-10-CM | POA: Diagnosis not present

## 2021-06-10 DIAGNOSIS — F329 Major depressive disorder, single episode, unspecified: Secondary | ICD-10-CM | POA: Diagnosis not present

## 2021-06-10 DIAGNOSIS — I10 Essential (primary) hypertension: Secondary | ICD-10-CM | POA: Diagnosis not present

## 2021-07-09 DIAGNOSIS — I10 Essential (primary) hypertension: Secondary | ICD-10-CM | POA: Diagnosis not present

## 2021-07-09 DIAGNOSIS — F329 Major depressive disorder, single episode, unspecified: Secondary | ICD-10-CM | POA: Diagnosis not present

## 2021-07-09 DIAGNOSIS — J449 Chronic obstructive pulmonary disease, unspecified: Secondary | ICD-10-CM | POA: Diagnosis not present

## 2021-07-09 DIAGNOSIS — E785 Hyperlipidemia, unspecified: Secondary | ICD-10-CM | POA: Diagnosis not present

## 2021-07-21 ENCOUNTER — Other Ambulatory Visit: Payer: Self-pay | Admitting: Orthopedic Surgery

## 2021-07-21 DIAGNOSIS — M48061 Spinal stenosis, lumbar region without neurogenic claudication: Secondary | ICD-10-CM

## 2021-07-22 ENCOUNTER — Telehealth: Payer: Self-pay | Admitting: *Deleted

## 2021-07-22 NOTE — Telephone Encounter (Signed)
-----   Message from Arnoldo Lenis, MD sent at 07/20/2021 12:34 PM EDT ----- Monitor shows afib with primarily controlled heart rates and only rare occasoins where heart rates are elevated, no changes needed  J BrancH MD

## 2021-07-22 NOTE — Telephone Encounter (Signed)
Laurine Blazer, LPN  05/17/3974  7:34 PM EDT Back to Top    Notified, copy to pcp.

## 2021-08-04 ENCOUNTER — Ambulatory Visit: Payer: Medicare HMO | Admitting: Orthopedic Surgery

## 2021-08-18 ENCOUNTER — Emergency Department (HOSPITAL_COMMUNITY)
Admission: EM | Admit: 2021-08-18 | Discharge: 2021-08-18 | Disposition: A | Discharge status: Home / Self Care | Payer: Medicare HMO | Attending: Emergency Medicine | Admitting: Emergency Medicine

## 2021-08-18 ENCOUNTER — Emergency Department (HOSPITAL_COMMUNITY): Payer: Medicare HMO

## 2021-08-18 ENCOUNTER — Other Ambulatory Visit: Payer: Self-pay

## 2021-08-18 ENCOUNTER — Encounter (HOSPITAL_COMMUNITY): Payer: Self-pay | Admitting: Emergency Medicine

## 2021-08-18 DIAGNOSIS — R9431 Abnormal electrocardiogram [ECG] [EKG]: Secondary | ICD-10-CM | POA: Diagnosis not present

## 2021-08-18 DIAGNOSIS — Z7901 Long term (current) use of anticoagulants: Secondary | ICD-10-CM | POA: Insufficient documentation

## 2021-08-18 DIAGNOSIS — R0602 Shortness of breath: Secondary | ICD-10-CM | POA: Diagnosis not present

## 2021-08-18 DIAGNOSIS — R0789 Other chest pain: Secondary | ICD-10-CM | POA: Diagnosis not present

## 2021-08-18 DIAGNOSIS — R079 Chest pain, unspecified: Secondary | ICD-10-CM

## 2021-08-18 DIAGNOSIS — R0609 Other forms of dyspnea: Secondary | ICD-10-CM | POA: Diagnosis not present

## 2021-08-18 DIAGNOSIS — I4891 Unspecified atrial fibrillation: Secondary | ICD-10-CM | POA: Diagnosis not present

## 2021-08-18 DIAGNOSIS — I4811 Longstanding persistent atrial fibrillation: Secondary | ICD-10-CM | POA: Diagnosis not present

## 2021-08-18 LAB — COMPREHENSIVE METABOLIC PANEL
ALT: 20 U/L (ref 0–44)
AST: 20 U/L (ref 15–41)
Albumin: 3.8 g/dL (ref 3.5–5.0)
Alkaline Phosphatase: 68 U/L (ref 38–126)
Anion gap: 11 (ref 5–15)
BUN: 14 mg/dL (ref 8–23)
CO2: 23 mmol/L (ref 22–32)
Calcium: 9.4 mg/dL (ref 8.9–10.3)
Chloride: 107 mmol/L (ref 98–111)
Creatinine, Ser: 1.16 mg/dL (ref 0.61–1.24)
GFR, Estimated: >60 mL/min (ref >60)
Glucose, Bld: 96 mg/dL (ref 70–99)
Potassium: 4.8 mmol/L (ref 3.5–5.1)
Sodium: 141 mmol/L (ref 135–145)
Total Bilirubin: 0.7 mg/dL (ref 0.3–1.2)
Total Protein: 7.2 g/dL (ref 6.5–8.1)

## 2021-08-18 LAB — TROPONIN I (HIGH SENSITIVITY)
Troponin I (High Sensitivity): 6 ng/L (ref <18)
Troponin I (High Sensitivity): 6 ng/L (ref <18)

## 2021-08-18 LAB — CBC
HCT: 46.3 % (ref 39.0–52.0)
Hemoglobin: 15 g/dL (ref 13.0–17.0)
MCH: 30 pg (ref 26.0–34.0)
MCHC: 32.4 g/dL (ref 30.0–36.0)
MCV: 92.6 fL (ref 80.0–100.0)
Platelets: 258 10*3/uL (ref 150–400)
RBC: 5 MIL/uL (ref 4.22–5.81)
RDW: 13.5 % (ref 11.5–15.5)
WBC: 7.4 10*3/uL (ref 4.0–10.5)
nRBC: 0 % (ref 0.0–0.2)

## 2021-08-18 MED ORDER — METOPROLOL SUCCINATE ER 25 MG PO TB24: 25.0000 mg | ORAL_TABLET | Freq: Every day | ORAL | 0 refills | Status: DC

## 2021-08-18 MED ORDER — METOPROLOL TARTRATE 5 MG/5ML IV SOLN
5.0000 mg | Freq: Once | INTRAVENOUS | Status: AC
  Administered 2021-08-18: 5 mg via INTRAVENOUS
  Filled 2021-08-18: qty 5

## 2021-08-18 MED ORDER — METOPROLOL TARTRATE 25 MG PO TABS
25.0000 mg | ORAL_TABLET | Freq: Once | ORAL | Status: AC
  Administered 2021-08-18: 25 mg via ORAL
  Filled 2021-08-18: qty 1

## 2021-08-18 NOTE — ED Provider Notes (Signed)
[place] EMERGENCY DEPARTMENT Provider Note   CSN: [id number] Arrival date & time: 08/18/21  1502     History {Add pertinent medical, surgical, social history, OB history to HPI:1} Chief Complaint  Patient presents with   Chest Pain    Jay [name] is a 71 y.o. male.  Presented to the emergency room due to concern for chest pain.  Patient reports symptoms have been going on for the past few weeks.  Seem to be worse when walking and improved when resting.  Described as chest pressure sensation, up to moderate in severity.  Currently not having any pain.  Also had some associated shortness of breath.  Currently not short of breath.  Has seen Dr. Harl Bowie previously, chronic A-fib for the past few months, on anticoagulation, not on any rate controlling medications.  HPI     Home Medications Prior to Admission medications   Medication Sig Start Date End Date Taking? Authorizing Provider  acetaminophen (TYLENOL) 500 MG tablet Take 1,000 mg by mouth every 6 (six) hours as needed for moderate pain.    [provider]  albuterol (VENTOLIN HFA) 108 (90 Base) MCG/ACT inhaler Inhale 2 puffs into the lungs every 6 (six) hours as needed for wheezing or shortness of breath.    [provider]  ALPRAZolam Duanne Moron) 0.5 MG tablet Take 0.5 mg by mouth as needed. 12/31/20   [provider]  amLODipine (NORVASC) 5 MG tablet Take 5 mg by mouth every morning. 12/17/20   [provider]  apixaban (ELIQUIS) 5 MG TABS tablet Take 1 tablet (5 mg total) by mouth 2 (two) times daily. 05/02/21 06/01/21  Hayden Rasmussen, MD  atorvastatin (LIPITOR) 20 MG tablet Take 20 mg by mouth daily.    [provider]  cyclobenzaprine (FLEXERIL) 10 MG tablet Take 5-10 mg by mouth at bedtime as needed. 12/03/20   [provider]  donepezil (ARICEPT) 10 MG tablet Take 10 mg by mouth at bedtime. 11/28/20   [provider]  escitalopram (LEXAPRO) 20  MG tablet Take 20 mg by mouth 2 (two) times daily.    [provider]  esomeprazole (NEXIUM) 40 MG capsule Take 40 mg by mouth daily before breakfast.    [provider]  famotidine (PEPCID) 20 MG tablet Take 20 mg by mouth at bedtime. 11/28/20   [provider]  HYDROcodone-acetaminophen (NORCO/VICODIN) 5-325 MG tablet Take 0.5-1 tablets by mouth every 6 (six) hours as needed. 01/15/21   [provider]  lamoTRIgine (LAMICTAL) 200 MG tablet Take 200 mg by mouth daily.    [provider]  lisinopril-hydrochlorothiazide (ZESTORETIC) 20-12.5 MG tablet Take 1 tablet by mouth every morning. 12/17/20   [provider]  magnesium oxide (MAG-OX) 400 MG tablet Take 400 mg by mouth daily.    [provider]  Multiple Vitamin (MULTIVITAMIN) tablet Take 1 tablet by mouth daily.    [provider]  OVER THE COUNTER MEDICATION Cbd wellness gummies    [provider]  [facility] injection  12/03/20   [provider]  vitamin B-12 (CYANOCOBALAMIN) 500 MCG tablet Take 500 mcg by mouth daily.    [provider]      Allergies    Patient has no known allergies.    Review of Systems   Review of Systems  Constitutional:  Negative for chills and fever.  HENT:  Negative for ear pain and sore throat.   Eyes:  Negative for pain and visual disturbance.  Respiratory:  Positive for chest tightness and shortness of breath. Negative for cough.   Cardiovascular:  Positive for chest pain. Negative for palpitations.  Gastrointestinal:  Negative for abdominal pain and vomiting.  Genitourinary:  Negative for dysuria and hematuria.  Musculoskeletal:  Negative for arthralgias and back pain.  Skin:  Negative for color change and rash.  Neurological:  Negative for seizures and syncope.  All other systems reviewed and are negative.   Physical Exam Updated Vital Signs BP (!) 139/98   Pulse 87   Temp 97.7 F (36.5 C) (Oral)    Resp 19   Ht '5\' 9"'$  (1.753 m)   Wt 91 kg   SpO2 98%   BMI 29.63 kg/m  Physical Exam Vitals and nursing note reviewed.  Constitutional:      General: He is not in acute distress.    Appearance: He is well-developed.  HENT:     Head: Normocephalic and atraumatic.  Eyes:     Conjunctiva/sclera: Conjunctivae normal.  Cardiovascular:     Rate and Rhythm: Tachycardia present. Rhythm irregular.     Heart sounds: No murmur heard. Pulmonary:     Effort: Pulmonary effort is normal. No respiratory distress.     Breath sounds: Normal breath sounds.  Abdominal:     Palpations: Abdomen is soft.     Tenderness: There is no abdominal tenderness.  Musculoskeletal:        General: No swelling.     Cervical back: Neck supple.  Skin:    General: Skin is warm and dry.     Capillary Refill: Capillary refill takes less than 2 seconds.  Neurological:     Mental Status: He is alert.  Psychiatric:        Mood and Affect: Mood normal.     ED Results / Procedures / Treatments   Labs (all labs ordered are listed, but only abnormal results are displayed) Labs Reviewed  CBC  COMPREHENSIVE METABOLIC PANEL  TROPONIN I (HIGH SENSITIVITY)  TROPONIN I (HIGH SENSITIVITY)    EKG EKG Interpretation  Date/Time:  Monday August 18 2021 15:55:52 EDT Ventricular Rate:  125 PR Interval:    QRS Duration: 86 QT Interval:  318 QTC Calculation: 458 R Axis:   48 Text Interpretation: Atrial fibrillation with rapid ventricular response Nonspecific ST and T wave abnormality Abnormal ECG When compared with ECG of 02-May-2021 17:11, PREVIOUS ECG IS PRESENT Confirmed by Madalyn Rob [phone]) on 08/18/2021 7:37:53 PM  Radiology DG Chest 2 View  Result Date: 08/18/2021 CLINICAL DATA:  Chest pain.  Shortness of breath. EXAM: CHEST - 2 VIEW COMPARISON:  May 02, 2021. FINDINGS: Stable cardiomediastinal silhouette. Both lungs are clear. The visualized skeletal structures are unremarkable. IMPRESSION: No active  cardiopulmonary disease. Electronically Signed   By: Marijo Conception M.D.   On: 08/18/2021 16:51    Procedures Procedures  {Document cardiac monitor, telemetry assessment procedure when appropriate:1}  Medications Ordered in ED Medications  metoprolol tartrate (LOPRESSOR) tablet 25 mg (has no administration in time range)  metoprolol tartrate (LOPRESSOR) injection 5 mg (5 mg Intravenous Given 08/18/21 2050)    ED Course/ Medical Decision Making/ A&P                           Medical Decision Making Amount and/or Complexity of Data Reviewed Labs: ordered. Radiology: ordered.  Risk Prescription drug management.   71 year old male with A-fib history presenting for chest pressure sensation and associated shortness of  breath.  On physical exam he appears well in no acute distress.  His EKG demonstrates atrial fibrillation.  Cardiac monitor shows that he is in atrial fibrillation.  Troponin x 2 within normal limits, no clear ischemic change on EKG, therefore lower suspicion for ACS.  Independently reviewed and interpreted CXR, no acute infiltrate or edema noted.  Patient does have history  {Document critical care time when appropriate:1} {Document review of labs and clinical decision tools ie heart score, Chads2Vasc2 etc:1}  {Document your independent review of radiology images, and any outside records:1} {Document your discussion with family members, caretakers, and with consultants:1} {Document social determinants of health affecting pt's care:1} {Document your decision making why or why not admission, treatments were needed:1} Final Clinical Impression(s) / ED Diagnoses Final diagnoses:  None    Rx / DC Orders ED Discharge Orders     None

## 2021-08-18 NOTE — Discharge Instructions (Signed)
Tomorrow start taking the new medication as prescribed.  Please follow-up with your cardiologist to discuss her symptoms further.  If you have any further episodes of chest pain or difficulty in breathing or other new concerning symptom, please return to ER for reassessment.

## 2021-08-18 NOTE — ED Provider Triage Note (Signed)
Emergency Medicine Provider Triage Evaluation Note  Jay [name] , a 71 y.o. male  was evaluated in triage.  Pt complains of chest pain, dizziness, and shortness of breath which been worsening for 3 weeks.  Patient has a history of atrial fibrillation, currently on Eliquis.  He states that he has been having chest pain across the entirety of his chest rated between 7 out of 10 and 10 out of 10 for 3 weeks.  The shortness of breath and dizziness have coincided with the chest pain.  Patient was evaluated primary care today who advised the patient to come to the emergency department for further evaluation.  He does have a follow-up with cardiology scheduled for next week.  Patient denies abdominal pain  Review of Systems  Positive: Chest pain, shortness of breath, dizziness Negative: Abdominal pain  Physical Exam  BP (!) 158/102 (BP Location: Right Arm)   Pulse (!) 48   Temp 98.9 F (37.2 C) (Oral)   Resp 18   SpO2 97%  Gen:   Awake, no distress   Resp:  Normal effort  MSK:   Moves extremities without difficulty  Other:    Medical Decision Making  Medically screening exam initiated at 4:10 PM.  Appropriate orders placed.  Phuong Moffatt Ernster was informed that the remainder of the evaluation will be completed by another provider, this initial triage assessment does not replace that evaluation, and the importance of remaining in the ED until their evaluation is complete.     Dorothyann Peng, PA-C 08/18/21 1611

## 2021-08-18 NOTE — ED Triage Notes (Signed)
Patient sent to ED from PCP with complaint of chest pain that started two weeks ago. Patient complains of constant shortness of breath, report intermittent dizziness. Patient is alert, oriented, ambulatory, speaking in complete sentences, and is in no apparent distress at this time.

## 2021-08-18 NOTE — ED Notes (Signed)
Patient verbalizes understanding of discharge instructions. Opportunity for questioning and answers were provided. Armband removed by staff, pt discharged from ED. Pt ambulatory to ED waiting room with steady gait.  

## 2021-08-21 DIAGNOSIS — L218 Other seborrheic dermatitis: Secondary | ICD-10-CM | POA: Diagnosis not present

## 2021-08-21 DIAGNOSIS — L570 Actinic keratosis: Secondary | ICD-10-CM | POA: Diagnosis not present

## 2021-08-21 DIAGNOSIS — X32XXXA Exposure to sunlight, initial encounter: Secondary | ICD-10-CM | POA: Diagnosis not present

## 2021-08-25 ENCOUNTER — Encounter: Payer: Self-pay | Admitting: *Deleted

## 2021-08-25 ENCOUNTER — Ambulatory Visit: Payer: Medicare HMO | Admitting: Cardiology

## 2021-08-25 ENCOUNTER — Encounter: Payer: Self-pay | Admitting: Cardiology

## 2021-08-25 VITALS — BP 120/76 | HR 83 | Ht 68.5 in | Wt 209.4 lb

## 2021-08-25 DIAGNOSIS — R0789 Other chest pain: Secondary | ICD-10-CM

## 2021-08-25 DIAGNOSIS — R079 Chest pain, unspecified: Secondary | ICD-10-CM | POA: Diagnosis not present

## 2021-08-25 DIAGNOSIS — I4891 Unspecified atrial fibrillation: Secondary | ICD-10-CM | POA: Diagnosis not present

## 2021-08-25 DIAGNOSIS — D6869 Other thrombophilia: Secondary | ICD-10-CM | POA: Diagnosis not present

## 2021-08-25 MED ORDER — METOPROLOL TARTRATE 25 MG PO TABS: 25.0000 mg | ORAL_TABLET | Freq: Two times a day (BID) | ORAL | 6 refills | Status: DC

## 2021-08-25 NOTE — Patient Instructions (Addendum)
Medication Instructions:  Stop Toprol (Metoprolol Suc) Begin Lopressor (Metoprolol Tart) '25mg'$  twice a day   Continue all other medications.     Labwork: none  Testing/Procedures: Your physician has requested that you have a lexiscan myoview. For further information please visit [email or web address]. Please follow instruction sheet, as given.  Office will contact with results via phone, letter or mychart.     Follow-Up: 2 months   Any Other Special Instructions Will Be Listed Below (If Applicable).   If you need a refill on your cardiac medications before your next appointment, please call your pharmacy.

## 2021-08-25 NOTE — Addendum Note (Signed)
Addended by: Laurine Blazer on: 08/25/2021 03:20 PM   Modules accepted: Orders

## 2021-08-25 NOTE — Progress Notes (Signed)
Clinical Summary Jay [name] is a 71 y.o.male  1. Syncope - admit 02/2021  - was in garage, no prodrome. Next thing he remembers if getting up off the floor.  - isolated episode in the past of fall with bending over - CT showed small ICH managed conservatively.   05/2021 30 day monitor: afib avg rate 88.  - no recent episodes.    2. Afib - new diagnosis 05/02/21 - from ER note they discussed with neurosurgery anticoag, was cleared to start - toprol started during recent ER visit with chest pain, rates were elevated at the time.     3. Carotid US 0/0867 RICA 61-95%, LICA 0-93%. BP discrepnacy right side suggesting right inonimate artery or subclavian stenosis, vertebral flow was normal  4. BP difference arms - reported during carotid study - from vitals today bp's are similar between arms.    5.Chest pain - ER visit 08/18/21 with chest pain. No evidence of ACS by ekg or enzymes - afib rates were elevated to 120s, started on toprol  - started 2-3 weeks prior - pressure midchest, 7-8/10 in severity. At rest or with exertion. +SOB, fatigued. Not positional. Would last few hours, constant.  - no palpitation - still with some episodes.    Past Medical History:  Diagnosis Date   Anxiety    Arthritis    COPD (chronic obstructive pulmonary disease) (HCC)    Depression    GERD (gastroesophageal reflux disease)    Hypertension    Shortness of breath    with excertion     No Known Allergies   Current Outpatient Medications  Medication Sig Dispense Refill   acetaminophen (TYLENOL) 500 MG tablet Take 1,000 mg by mouth every 6 (six) hours as needed for moderate pain.     albuterol (VENTOLIN HFA) 108 (90 Base) MCG/ACT inhaler Inhale 2 puffs into the lungs every 6 (six) hours as needed for wheezing or shortness of breath.     ALPRAZolam (XANAX) 0.5 MG tablet Take 0.5 mg by mouth as needed.     amLODipine (NORVASC) 5 MG tablet Take 5 mg by mouth every morning.     apixaban  (ELIQUIS) 5 MG TABS tablet Take 1 tablet (5 mg total) by mouth 2 (two) times daily. 60 tablet 0   atorvastatin (LIPITOR) 20 MG tablet Take 20 mg by mouth daily.     cyclobenzaprine (FLEXERIL) 10 MG tablet Take 5-10 mg by mouth at bedtime as needed.     donepezil (ARICEPT) 10 MG tablet Take 10 mg by mouth at bedtime.     escitalopram (LEXAPRO) 20 MG tablet Take 20 mg by mouth 2 (two) times daily.     esomeprazole (NEXIUM) 40 MG capsule Take 40 mg by mouth daily before breakfast.     famotidine (PEPCID) 20 MG tablet Take 20 mg by mouth at bedtime.     HYDROcodone-acetaminophen (NORCO/VICODIN) 5-325 MG tablet Take 0.5-1 tablets by mouth every 6 (six) hours as needed.     lamoTRIgine (LAMICTAL) 200 MG tablet Take 200 mg by mouth daily.     lisinopril-hydrochlorothiazide (ZESTORETIC) 20-12.5 MG tablet Take 1 tablet by mouth every morning.     magnesium oxide (MAG-OX) 400 MG tablet Take 400 mg by mouth daily.     metoprolol succinate (TOPROL-XL) 25 MG 24 hr tablet Take 1 tablet (25 mg total) by mouth daily. 30 tablet 0   Multiple Vitamin (MULTIVITAMIN) tablet Take 1 tablet by mouth daily.     OVER  THE COUNTER MEDICATION Cbd wellness gummies     SHINGRIX injection      vitamin B-12 (CYANOCOBALAMIN) 500 MCG tablet Take 500 mcg by mouth daily.     No current facility-administered medications for this visit.     Past Surgical History:  Procedure Laterality Date   APPENDECTOMY     EYE SURGERY       No Known Allergies    Family History  Problem Relation Age of Onset   Hypertension Mother    Cancer Father    Hypertension Father    Hypertension Sister    Cancer Brother    Hypertension Brother      Social History Jay [name] reports that he has quit smoking. He has never used smokeless tobacco. Jay [name] reports no history of alcohol use.   Review of Systems CONSTITUTIONAL: No weight loss, fever, chills, weakness or fatigue.  HEENT: Eyes: No visual loss, blurred vision, double vision  or yellow sclerae.No hearing loss, sneezing, congestion, runny nose or sore throat.  SKIN: No rash or itching.  CARDIOVASCULAR: per hpi RESPIRATORY: No shortness of breath, cough or sputum.  GASTROINTESTINAL: No anorexia, nausea, vomiting or diarrhea. No abdominal pain or blood.  GENITOURINARY: No burning on urination, no polyuria NEUROLOGICAL: No headache, dizziness, syncope, paralysis, ataxia, numbness or tingling in the extremities. No change in bowel or bladder control.  MUSCULOSKELETAL: No muscle, back pain, joint pain or stiffness.  LYMPHATICS: No enlarged nodes. No history of splenectomy.  PSYCHIATRIC: No history of depression or anxiety.  ENDOCRINOLOGIC: No reports of sweating, cold or heat intolerance. No polyuria or polydipsia.  Marland Kitchen   Physical Examination Today's Vitals   08/25/21 1434 08/25/21 1435  BP: 116/82 120/76  Pulse: 83   SpO2: 98%   Weight: 209 lb 6.4 oz (95 kg)   Height: 5' 8.5" (1.74 m)    Body mass index is 31.38 kg/m.  Gen: resting comfortably, no acute distress HEENT: no scleral icterus, pupils equal round and reactive, no palptable cervical adenopathy,  CV: irreg, no m/r/g no jvd Resp: Clear to auscultation bilaterally GI: abdomen is soft, non-tender, non-distended, normal bowel sounds, no hepatosplenomegaly MSK: extremities are warm, no edema.  Skin: warm, no rash Neuro:  no focal deficits Psych: appropriate affect   Diagnostic Studies  12/2020 monitor 2 day monitor Rare supraventricular ectopy in the form of isolated PACs, couplets, triplets. Isolated 5 beat run of SVT. Frequent ventricular ectopy in the form of isolated PVCs. Rare couplets. No symptoms reported   02/2021 echo   1. Left ventricular ejection fraction, by estimation, is 55 to 60%. The  left ventricle has normal function. The left ventricle has no regional  wall motion abnormalities. Left ventricular diastolic parameters are  consistent with Grade I diastolic  dysfunction  (impaired relaxation).   2. Right ventricular systolic function is normal. The right ventricular  size is mildly enlarged.   3. Left atrial size was mildly dilated.   4. The mitral valve is normal in structure. Trivial mitral valve  regurgitation. No evidence of mitral stenosis.   5. The aortic valve is normal in structure. Aortic valve regurgitation is  not visualized. No aortic stenosis is present.   6. The inferior vena cava is normal in size with greater than 50%  respiratory variability, suggesting right atrial pressure of 3 mmHg.     05/2021 monitor 30 day monitor Min HR 62, Max HR 171, Avg HR 88 Tele tracings show afib variable rates. One episode of symptoms correlated  with afib rate 100 isolated PVC Assessment and Plan   1.Chest pain - recent ER visit, negative evaluation for ACS - plan for lexiscan for further evaluatin  2. Afib/acquired thrombophilia - toprol expensive, change to lopressor '25mg'$  bid -continue eliquis for stroke prevention     Jay [name], M.D.

## 2021-08-26 ENCOUNTER — Telehealth: Payer: Self-pay | Admitting: Cardiology

## 2021-08-26 NOTE — Telephone Encounter (Signed)
Checking percert on the following patient for testing scheduled at [facility].     LEXISCAN   09/01/2021

## 2021-09-01 ENCOUNTER — Encounter (HOSPITAL_BASED_OUTPATIENT_CLINIC_OR_DEPARTMENT_OTHER)
Admission: RE | Admit: 2021-09-01 | Discharge: 2021-09-01 | Disposition: A | Discharge status: Home / Self Care | Payer: Medicare HMO | Source: Ambulatory Visit | Attending: Cardiology | Admitting: Cardiology

## 2021-09-01 ENCOUNTER — Ambulatory Visit (HOSPITAL_COMMUNITY)
Admission: RE | Admit: 2021-09-01 | Discharge: 2021-09-01 | Disposition: A | Discharge status: Home / Self Care | Payer: Medicare HMO | Source: Ambulatory Visit | Attending: Cardiology | Admitting: Cardiology

## 2021-09-01 DIAGNOSIS — R079 Chest pain, unspecified: Secondary | ICD-10-CM | POA: Diagnosis not present

## 2021-09-01 LAB — NM MYOCAR MULTI W/SPECT W/WALL MOTION / EF
LV dias vol: 93 mL (ref 62–150)
LV sys vol: 52 mL
Nuc Stress EF: 45 %
Peak HR: 115 {beats}/min
RATE: 0.3
Rest HR: 97 {beats}/min
Rest Nuclear Isotope Dose: 11 mCi
SDS: 0
SRS: 2
SSS: 2
ST Depression (mm): 0 mm
Stress Nuclear Isotope Dose: 32 mCi
TID: 1.27

## 2021-09-01 MED ORDER — REGADENOSON 0.4 MG/5ML IV SOLN
INTRAVENOUS | Status: AC
  Filled 2021-09-01: qty 5

## 2021-09-01 MED ORDER — SODIUM CHLORIDE FLUSH 0.9 % IV SOLN
INTRAVENOUS | Status: AC
  Filled 2021-09-01: qty 10

## 2021-09-01 MED ORDER — TECHNETIUM TC 99M TETROFOSMIN IV KIT
30.0000 | PACK | Freq: Once | INTRAVENOUS | Status: AC | PRN
  Administered 2021-09-01: 32 via INTRAVENOUS

## 2021-09-01 MED ORDER — TECHNETIUM TC 99M TETROFOSMIN IV KIT
10.0000 | PACK | Freq: Once | INTRAVENOUS | Status: AC | PRN
  Administered 2021-09-01: 11 via INTRAVENOUS

## 2021-09-08 ENCOUNTER — Encounter: Payer: Self-pay | Admitting: Orthopedic Surgery

## 2021-09-08 ENCOUNTER — Ambulatory Visit: Payer: Medicare HMO | Admitting: Orthopedic Surgery

## 2021-09-08 VITALS — BP 145/97 | HR 98 | Ht 69.0 in | Wt 210.0 lb

## 2021-09-08 DIAGNOSIS — M4722 Other spondylosis with radiculopathy, cervical region: Secondary | ICD-10-CM

## 2021-09-08 DIAGNOSIS — M48061 Spinal stenosis, lumbar region without neurogenic claudication: Secondary | ICD-10-CM

## 2021-09-08 NOTE — Progress Notes (Unsigned)
Chief Complaint  Patient presents with   New Patient (Initial Visit)   Back Pain    Starts at neck and radiates down entire back.    Neck Pain   71 year old male on Eliquis for atrial fibrillation previously seen by Dr. [facility] presents with back and neck pain.  He had an MRI of his back last year had a C-spine MRI scheduled but it has not been completed  Patient complains of neck pain radiates down his back and then lower back pain which radiates into both legs  C-spine exam reveals tenderness and stiffness with pain on rotation to the left when he rotates his neck as well as pain with flexion but no radicular symptoms grip strength is normal reflexes are normal no atrophy seen in the musculature  Lower back pain tenderness noted to palpation  With normal strength in both legs  MRI reportDisc levels:   L1-L2: Normal disc space and facet joints. No spinal canal stenosis. No neural foraminal stenosis.   L2-L3: Mild facet hypertrophy and small left asymmetric disc bulge. No spinal canal stenosis. No neural foraminal stenosis.   L3-L4: Small right asymmetric disc bulge. No spinal canal stenosis. No neural foraminal stenosis.   L4-L5: Medium-sized right asymmetric disc bulge with right facet hypertrophy. No spinal canal stenosis. No neural foraminal stenosis.   L5-S1: Moderate left facet hypertrophy. No spinal canal stenosis. No neural foraminal stenosis.   Visualized sacrum: Normal.   IMPRESSION: 1. Mild multilevel lumbar degenerative disc disease without spinal canal or neural foraminal stenosis. 2. Moderate left L4-5 and left L5-S1 facet arthrosis. 3. Fatty infiltration of the right paraspinous muscles.     Electronically Signed   By: Ulyses Jarred M.D.   On: 01/06/2021 23:08   C-spine report IMPRESSION: Disc degeneration and spondylosis most prominent at C4-5 and C5-6 with moderate foraminal encroachment bilaterally. Retrolisthesis of C4-5 felt to be  degenerative.     Electronically Signed   By: Franchot Gallo M.D.   On: 06/11/2015 13:07  I recommend the patient go back to the spinal group.  Patient has had 1 episode of injections he says and he also had some pain medication but said neither 1 worked  This is obviously beyond the scope of our practice since this is more than a simple neck strain or back strain  Patient is also on Eliquis has atrial fibrillation and is unlikely to be a surgical candidate  Recommend referral

## 2021-09-08 NOTE — Patient Instructions (Signed)
[phone] [facility], Phone  You can call their office for another appointments you have seen them before and they are spine specialists Address: [address], [place], [place] [address]

## 2021-09-10 DIAGNOSIS — I10 Essential (primary) hypertension: Secondary | ICD-10-CM | POA: Diagnosis not present

## 2021-09-10 DIAGNOSIS — E785 Hyperlipidemia, unspecified: Secondary | ICD-10-CM | POA: Diagnosis not present

## 2021-09-10 DIAGNOSIS — J449 Chronic obstructive pulmonary disease, unspecified: Secondary | ICD-10-CM | POA: Diagnosis not present

## 2021-09-10 DIAGNOSIS — F324 Major depressive disorder, single episode, in partial remission: Secondary | ICD-10-CM | POA: Diagnosis not present

## 2021-09-10 DIAGNOSIS — K219 Gastro-esophageal reflux disease without esophagitis: Secondary | ICD-10-CM | POA: Diagnosis not present

## 2021-09-23 ENCOUNTER — Encounter: Payer: Self-pay | Admitting: Adult Health

## 2021-09-23 ENCOUNTER — Ambulatory Visit (INDEPENDENT_AMBULATORY_CARE_PROVIDER_SITE_OTHER): Payer: Medicare HMO | Admitting: Adult Health

## 2021-09-23 VITALS — BP 136/84 | Ht 68.0 in | Wt 211.0 lb

## 2021-09-23 DIAGNOSIS — Z1211 Encounter for screening for malignant neoplasm of colon: Secondary | ICD-10-CM

## 2021-09-23 DIAGNOSIS — J449 Chronic obstructive pulmonary disease, unspecified: Secondary | ICD-10-CM

## 2021-09-23 DIAGNOSIS — I10 Essential (primary) hypertension: Secondary | ICD-10-CM | POA: Diagnosis not present

## 2021-09-23 DIAGNOSIS — Z7689 Persons encountering health services in other specified circumstances: Secondary | ICD-10-CM

## 2021-09-23 DIAGNOSIS — I4811 Longstanding persistent atrial fibrillation: Secondary | ICD-10-CM | POA: Diagnosis not present

## 2021-09-23 DIAGNOSIS — R4189 Other symptoms and signs involving cognitive functions and awareness: Secondary | ICD-10-CM | POA: Diagnosis not present

## 2021-09-23 DIAGNOSIS — F418 Other specified anxiety disorders: Secondary | ICD-10-CM | POA: Diagnosis not present

## 2021-09-23 MED ORDER — BUPROPION HCL ER (XL) 150 MG PO TB24: 150.0000 mg | ORAL_TABLET | Freq: Every day | ORAL | 0 refills | Status: DC

## 2021-09-23 NOTE — Patient Instructions (Addendum)
I am going to have you stop Duloxetine and  start Wellbutrin   Lets follow up in one month   Someone from Pulmonary and Gastroenterology

## 2021-09-23 NOTE — Progress Notes (Addendum)
Patient presents to clinic today to establish care. He is a pleasant 71 year old male who  has a past medical history of Anxiety, Arthritis, COPD (chronic obstructive pulmonary disease) ([place]), Depression, GERD (gastroesophageal reflux disease), Hypertension, and Shortness of breath.  Previous patient of Dr. Alyson Ingles.  Believes his last CPE was in the spring 2023   Acute Concerns: Establish Care   Chronic Issues: Hypertension-managed with Norvasc 5 mg daily and metoprolol 25 mg twice daily.  Atrial Fibrillation -newly diagnosed in April 2023.  Currently managed with Eliquis 5 mg BID  and metoprolol 25 mg BID.  He is followed by cardiology  Anxiety/Depression -he is currently managed with Lamictal '200mg'$  twice daily and Cymbalta 60 mg daily, this was started  acouple of months ago, does not feel as though this medication is working.  Prior to that he was taking Lexapro 20 mg twice daily as well as Xanax but this was not working as well.  He reports a long history of anxiety and depression.  He is unsure of what medications he has been on in the past.  Cognitive Impairment - managed with Aricept 10 mg daily. Feels as though his memory is " pretty good"  COPD - Currently maintained on Spirvia and Adviar - he reports that he continues to have trouble with breathing. Has never been to a pulmonologist but would like to be referred  Hyperlipidemia - takes Lipitor 20 mg daily   Health Maintenance: Dental -- Does not do routine care Vision -- Routine  Immunizations --  unsure  Colonoscopy -- 2013 by [facility]. Has polyps and is supposed to be on 5 year plan    Past Medical History:  Diagnosis Date   Anxiety    Arthritis    COPD (chronic obstructive pulmonary disease) (HCC)    Depression    GERD (gastroesophageal reflux disease)    Hypertension    Shortness of breath    with excertion    Past Surgical History:  Procedure Laterality Date   APPENDECTOMY     EYE SURGERY      Current  Outpatient Medications on File Prior to Visit  Medication Sig Dispense Refill   acetaminophen (TYLENOL) 500 MG tablet Take 1,000 mg by mouth every 6 (six) hours as needed for moderate pain.     albuterol (VENTOLIN HFA) 108 (90 Base) MCG/ACT inhaler Inhale 2 puffs into the lungs every 6 (six) hours as needed for wheezing or shortness of breath.     amLODipine (NORVASC) 5 MG tablet Take 5 mg by mouth every morning.     apixaban (ELIQUIS) 5 MG TABS tablet Take 1 tablet (5 mg total) by mouth 2 (two) times daily. 60 tablet 0   atorvastatin (LIPITOR) 20 MG tablet Take 20 mg by mouth daily.     donepezil (ARICEPT) 10 MG tablet Take 10 mg by mouth at bedtime.     magnesium oxide (MAG-OX) 400 MG tablet Take 400 mg by mouth daily.     metoprolol tartrate (LOPRESSOR) 25 MG tablet Take 1 tablet (25 mg total) by mouth 2 (two) times daily. 60 tablet 6   No current facility-administered medications on file prior to visit.    No Known Allergies  Family History  Problem Relation Age of Onset   Hypertension Mother    Cancer Father    Hypertension Father    Hypertension Sister    Cancer Brother    Hypertension Brother     Social History  Socioeconomic History   Marital status: Married    Spouse name: Not on file   Number of children: Not on file   Years of education: Not on file   Highest education level: Not on file  Occupational History   Not on file  Tobacco Use   Smoking status: Former    Types: Cigarettes    Quit date: 01/12/1993    Years since quitting: 28.7   Smokeless tobacco: Never  Vaping Use   Vaping Use: Never used  Substance and Sexual Activity   Alcohol use: No    Comment: Quit 1995   Drug use: No   Sexual activity: Not on file  Other Topics Concern   Not on file  Social History Narrative   Not on file   Social Determinants of Health   Financial Resource Strain: Not on file  Food Insecurity: Not on file  Transportation Needs: Not on file  Physical Activity: Not on  file  Stress: Not on file  Social Connections: Not on file  Intimate Partner Violence: Not on file    ROS  BP 136/84   Ht '5\' 8"'$  (1.727 m)   Wt 211 lb (95.7 kg)   BMI 32.08 kg/m   Physical Exam  Recent Results (from the past 2160 hour(s))  CBC     Status: None   Collection Time: 08/18/21  4:13 PM  Result Value Ref Range   WBC 7.4 4.0 - 10.5 K/uL   RBC 5.00 4.22 - 5.81 MIL/uL   Hemoglobin 15.0 13.0 - 17.0 g/dL   HCT 46.3 39.0 - 52.0 %   MCV 92.6 80.0 - 100.0 fL   MCH 30.0 26.0 - 34.0 pg   MCHC 32.4 30.0 - 36.0 g/dL   RDW 13.5 11.5 - 15.5 %   Platelets 258 150 - 400 K/uL   nRBC 0.0 0.0 - 0.2 %    Comment: Performed at [facility], [place] [address] [place], [place] [address]  Troponin I (High Sensitivity)     Status: None   Collection Time: 08/18/21  4:13 PM  Result Value Ref Range   Troponin I (High Sensitivity) 6 <18 ng/L    Comment: (NOTE) Elevated high sensitivity troponin I (hsTnI) values and significant  changes across serial measurements may suggest ACS but many other  chronic and acute conditions are known to elevate hsTnI results.  Refer to the "Links" section for chest pain algorithms and additional  guidance. Performed at [facility], [place] [address] [place], [place] [address]   Comprehensive metabolic panel     Status: None   Collection Time: 08/18/21  4:13 PM  Result Value Ref Range   Sodium 141 135 - 145 mmol/L   Potassium 4.8 3.5 - 5.1 mmol/L   Chloride 107 98 - 111 mmol/L   CO2 23 22 - 32 mmol/L   Glucose, Bld 96 70 - 99 mg/dL    Comment: Glucose reference range applies only to samples taken after fasting for at least 8 hours.   BUN 14 8 - 23 mg/dL   Creatinine, Ser 1.16 0.61 - 1.24 mg/dL   Calcium 9.4 8.9 - 10.3 mg/dL   Total Protein 7.2 6.5 - 8.1 g/dL   Albumin 3.8 3.5 - 5.0 g/dL   AST 20 15 - 41 U/L   ALT 20 0 - 44 U/L   Alkaline Phosphatase 68 38 - 126 U/L   Total Bilirubin 0.7 0.3 - 1.2 mg/dL   GFR, Estimated >  60 >60 mL/min     Comment: (NOTE) Calculated using the CKD-EPI Creatinine Equation (2021)    Anion gap 11 5 - 15    Comment: Performed at [facility], [place] [address], [place], [place] [address]  Troponin I (High Sensitivity)     Status: None   Collection Time: 08/18/21  7:13 PM  Result Value Ref Range   Troponin I (High Sensitivity) 6 <18 ng/L    Comment: (NOTE) Elevated high sensitivity troponin I (hsTnI) values and significant  changes across serial measurements may suggest ACS but many other  chronic and acute conditions are known to elevate hsTnI results.  Refer to the "Links" section for chest pain algorithms and additional  guidance. Performed at [facility], [place] [address], [place], [place] [address]   NM Myocar Multi W/Spect Tamela Oddi Motion / EF     Status: None   Collection Time: 09/01/21 12:23 PM  Result Value Ref Range   Rest HR 97.0 bpm   Rest BP 126/85 mmHg   Peak HR 115 bpm   Peak BP 137/68 mmHg   Rest Nuclear Isotope Dose 11.0 mCi   Stress Nuclear Isotope Dose 32.0 mCi   SSS 2.0    SRS 2.0    SDS 0.0    TID 1.27    LV sys vol 52.0 mL   LV dias vol 93.0 62 - 150 mL   RATE 0.3    Nuc Stress EF 45 %   ST Depression (mm) 0 mm    Assessment/Plan: 1. Encounter to establish care - Will request records from [facility]  2. Primary hypertension - Controlled.   3. Chronic obstructive pulmonary disease, unspecified COPD type ([place])  - Ambulatory referral to Pulmonology  4. Depression with anxiety - Will stop Cymbalta and keep on Lamictal for the time being. Start Wellbutrin  - Follow up in one month  - buPROPion (WELLBUTRIN XL) 150 MG 24 hr tablet; Take 1 tablet (150 mg total) by mouth daily.  Dispense: 90 tablet; Refill: 0   5. Longstanding persistent atrial fibrillation [facility]) - Per cardiology   6. Cognitive impairment - Continue Aricept   7. Colon cancer screening  - Ambulatory referral to Gastroenterology   Dorothyann Peng, NP

## 2021-09-24 ENCOUNTER — Encounter: Payer: Self-pay | Admitting: *Deleted

## 2021-10-03 ENCOUNTER — Emergency Department (HOSPITAL_COMMUNITY)
Admission: EM | Admit: 2021-10-03 | Discharge: 2021-10-03 | Disposition: A | Discharge status: Home / Self Care | Payer: Medicare HMO | Attending: Emergency Medicine | Admitting: Emergency Medicine

## 2021-10-03 ENCOUNTER — Telehealth: Payer: Self-pay

## 2021-10-03 ENCOUNTER — Emergency Department (HOSPITAL_COMMUNITY): Payer: Medicare HMO

## 2021-10-03 ENCOUNTER — Encounter (HOSPITAL_COMMUNITY): Payer: Self-pay | Admitting: *Deleted

## 2021-10-03 ENCOUNTER — Other Ambulatory Visit: Payer: Self-pay

## 2021-10-03 DIAGNOSIS — Z7901 Long term (current) use of anticoagulants: Secondary | ICD-10-CM | POA: Diagnosis not present

## 2021-10-03 DIAGNOSIS — I4891 Unspecified atrial fibrillation: Secondary | ICD-10-CM | POA: Diagnosis not present

## 2021-10-03 DIAGNOSIS — R0602 Shortness of breath: Secondary | ICD-10-CM

## 2021-10-03 DIAGNOSIS — Z20822 Contact with and (suspected) exposure to covid-19: Secondary | ICD-10-CM | POA: Diagnosis not present

## 2021-10-03 DIAGNOSIS — Z79899 Other long term (current) drug therapy: Secondary | ICD-10-CM | POA: Insufficient documentation

## 2021-10-03 DIAGNOSIS — I10 Essential (primary) hypertension: Secondary | ICD-10-CM | POA: Insufficient documentation

## 2021-10-03 DIAGNOSIS — R079 Chest pain, unspecified: Secondary | ICD-10-CM | POA: Diagnosis not present

## 2021-10-03 LAB — CBC
HCT: 45.8 % (ref 39.0–52.0)
Hemoglobin: 14.7 g/dL (ref 13.0–17.0)
MCH: 29.5 pg (ref 26.0–34.0)
MCHC: 32.1 g/dL (ref 30.0–36.0)
MCV: 91.8 fL (ref 80.0–100.0)
Platelets: 249 10*3/uL (ref 150–400)
RBC: 4.99 MIL/uL (ref 4.22–5.81)
RDW: 14 % (ref 11.5–15.5)
WBC: 9.5 10*3/uL (ref 4.0–10.5)
nRBC: 0 % (ref 0.0–0.2)

## 2021-10-03 LAB — BASIC METABOLIC PANEL
Anion gap: 12 (ref 5–15)
BUN: 18 mg/dL (ref 8–23)
CO2: 24 mmol/L (ref 22–32)
Calcium: 9.8 mg/dL (ref 8.9–10.3)
Chloride: 107 mmol/L (ref 98–111)
Creatinine, Ser: 1.2 mg/dL (ref 0.61–1.24)
GFR, Estimated: >60 mL/min (ref >60)
Glucose, Bld: 103 mg/dL — ABNORMAL HIGH (ref 70–99)
Potassium: 4.2 mmol/L (ref 3.5–5.1)
Sodium: 143 mmol/L (ref 135–145)

## 2021-10-03 LAB — RESP PANEL BY RT-PCR (FLU A&B, COVID) ARPGX2
Influenza A by PCR: NEGATIVE
Influenza B by PCR: NEGATIVE
SARS Coronavirus 2 by RT PCR: NEGATIVE

## 2021-10-03 LAB — PROTIME-INR
INR: 1.3 — ABNORMAL HIGH (ref 0.8–1.2)
Prothrombin Time: 16.1 seconds — ABNORMAL HIGH (ref 11.4–15.2)

## 2021-10-03 LAB — TROPONIN I (HIGH SENSITIVITY)
Troponin I (High Sensitivity): 3 ng/L (ref <18)
Troponin I (High Sensitivity): 4 ng/L (ref <18)

## 2021-10-03 LAB — BRAIN NATRIURETIC PEPTIDE: B Natriuretic Peptide: 175 pg/mL — ABNORMAL HIGH (ref 0.0–100.0)

## 2021-10-03 MED ORDER — METOPROLOL TARTRATE 25 MG PO TABS
25.0000 mg | ORAL_TABLET | Freq: Once | ORAL | Status: AC
  Administered 2021-10-03: 25 mg via ORAL
  Filled 2021-10-03: qty 1

## 2021-10-03 MED ORDER — METOPROLOL TARTRATE 50 MG PO TABS: 50.0000 mg | ORAL_TABLET | Freq: Two times a day (BID) | ORAL | 1 refills | Status: DC

## 2021-10-03 MED ORDER — SODIUM CHLORIDE 0.9 % IV BOLUS
500.0000 mL | Freq: Once | INTRAVENOUS | Status: AC
  Administered 2021-10-03: 500 mL via INTRAVENOUS

## 2021-10-03 MED ORDER — METOPROLOL TARTRATE 5 MG/5ML IV SOLN
5.0000 mg | Freq: Once | INTRAVENOUS | Status: AC
  Administered 2021-10-03: 5 mg via INTRAVENOUS
  Filled 2021-10-03: qty 5

## 2021-10-03 NOTE — ED Notes (Signed)
Delay in d/c d/t multiple d/cs, and high acuity pts.

## 2021-10-03 NOTE — Telephone Encounter (Signed)
Pt notified of update and stated he had to go to the ED. Pt found out he was in A-Fib

## 2021-10-03 NOTE — ED Notes (Signed)
Feels cooler, calmer, better, "ready to go", family x2 at [facility].

## 2021-10-03 NOTE — Discharge Instructions (Signed)
You were seen in the emergency department today for shortness of breath.  Your heart rate was a little elevated with her atrial fibrillation.  We were able to decrease that back down to a better rate.  I am increasing your metoprolol to 50 mg twice a day.  Please follow-up with Dr. Harl Bowie at your earliest convenience.  Please return to the emergency department for symptoms similar to how he felt today including feeling that your heart is racing, worsening shortness of breath or difficulty breathing, chest pain.

## 2021-10-03 NOTE — Telephone Encounter (Signed)
Spoke to pt spouse she she advised that the Wellbutrin was causing pt to have panic attacks. She would like to know what she can do. Pt sx are SOB and unable to sit still.

## 2021-10-03 NOTE — ED Notes (Signed)
C/o feeling hot, afebrile, given BS fan.

## 2021-10-03 NOTE — ED Notes (Signed)
Lab at [facility] 

## 2021-10-03 NOTE — ED Triage Notes (Signed)
Pt in c/o SOB worsenign over the last mth, hx of COPD with pulmonologist appt scheduled for Oct., pt reports onset bil upper CP x 4-5 days, c/o nausea today, denies v/d, pt A&O x4

## 2021-10-03 NOTE — Telephone Encounter (Signed)
Patient notified of update  and verbalized understanding. 

## 2021-10-03 NOTE — ED Provider Notes (Signed)
[facility] EMERGENCY DEPARTMENT Provider Note   CSN: [id number] Arrival date & time: 10/03/21  1201     History  Chief Complaint  Patient presents with   Shortness of Jay [name] is a 71 y.o. male. With past medical history of COPD, HTN, GERD, arthritis who presents to the emergency department with shortness of breath.  States that over the past 2-3 nights he has been having orthopnea.  States that he is having great difficulty laying flat or on his side at night.  Has been sleeping reclined.  States that he has been feeling short of breath even at rest during the day.  Symptoms increased today with an episode of nausea without vomiting.  It is not worsened by exertion.  He is also having left-sided chest tightness that intermittently radiates to the right chest.  States that this is also been constant over the past few days.  He denies feeling palpitations.  He has lightheadedness without syncope.  He states he has chronic cough which has been unchanged.  He denies any fever.    He has a history of new onset atrial fibrillation in April 2023.  He was started on Eliquis here in the emergency department and follow-up with Dr. Harl Bowie with cardiology.  In the interim he has had episodes of chest pain which he was evaluated in the emergency department and eventually started on metoprolol 25 mg twice daily.  States that he has not missed any doses of his Eliquis or metoprolol.     Shortness of Breath Associated symptoms: chest pain and cough   Associated symptoms: no abdominal pain, no diaphoresis, no fever and no vomiting        Home Medications Prior to Admission medications   Medication Sig Start Date End Date Taking? Authorizing Provider  acetaminophen (TYLENOL) 500 MG tablet Take 1,000 mg by mouth every 6 (six) hours as needed for moderate pain.   Yes [provider]  albuterol (VENTOLIN HFA) 108 (90 Base) MCG/ACT inhaler Inhale 2 puffs into the lungs every 6  (six) hours as needed for wheezing or shortness of breath.   Yes [provider]  amLODipine (NORVASC) 5 MG tablet Take 5 mg by mouth every morning. 12/17/20  Yes [provider]  apixaban (ELIQUIS) 5 MG TABS tablet Take 1 tablet (5 mg total) by mouth 2 (two) times daily. 05/02/21 08/26/22 Yes Hayden Rasmussen, MD  atorvastatin (LIPITOR) 20 MG tablet Take 20 mg by mouth daily.   Yes [provider]  buPROPion (WELLBUTRIN XL) 150 MG 24 hr tablet Take 1 tablet (150 mg total) by mouth daily. 09/23/21  Yes Nafziger, Tommi Rumps, NP  donepezil (ARICEPT) 10 MG tablet Take 10 mg by mouth at bedtime. 11/28/20  Yes [provider]  esomeprazole (NEXIUM) 40 MG capsule Take 40 mg by mouth daily at 12 noon.   Yes [provider]  famotidine (PEPCID) 20 MG tablet Take 20 mg by mouth daily.   Yes [provider]  lamoTRIgine (LAMICTAL) 200 MG tablet Take 200 mg by mouth daily.   Yes [provider]  magnesium oxide (MAG-OX) 400 MG tablet Take 400 mg by mouth daily.   Yes [provider]  Multiple Vitamin (MULTIVITAMIN) tablet Take 1 tablet by mouth daily.   Yes [provider]  metoprolol tartrate (LOPRESSOR) 50 MG tablet Take 1 tablet (50 mg total) by mouth 2 (two) times daily. 10/03/21 12/02/21  Mickie Hillier, PA-C  Allergies    Patient has no known allergies.    Review of Systems   Review of Systems  Constitutional:  Negative for chills, diaphoresis and fever.  Respiratory:  Positive for cough, chest tightness and shortness of breath.   Cardiovascular:  Positive for chest pain. Negative for palpitations and leg swelling.  Gastrointestinal:  Positive for nausea. Negative for abdominal pain and vomiting.  Neurological:  Positive for light-headedness. Negative for dizziness and syncope.  All other systems reviewed and are negative.   Physical Exam Updated Vital Signs BP (!) 143/89   Pulse 99   Temp 97.6 F (36.4 C) (Oral)    Resp (!) 22   Ht '5\' 8"'$  (1.727 m)   Wt 95.3 kg   SpO2 97%   BMI 31.93 kg/m  Physical Exam Vitals and nursing note reviewed.  Constitutional:      General: He is not in acute distress.    Appearance: Normal appearance. He is well-developed. He is ill-appearing. He is not toxic-appearing.  HENT:     Head: Normocephalic and atraumatic.     Mouth/Throat:     Mouth: Mucous membranes are moist.  Eyes:     General: No scleral icterus.    Extraocular Movements: Extraocular movements intact.  Neck:     Vascular: No JVD.  Cardiovascular:     Rate and Rhythm: Tachycardia present. Rhythm irregularly irregular.     Pulses:          Radial pulses are 1+ on the right side and 1+ on the left side.     Heart sounds: No murmur heard.    Comments: Afib w/ RVR  Pulmonary:     Effort: Pulmonary effort is normal. No tachypnea, accessory muscle usage or respiratory distress.     Breath sounds: Normal breath sounds. No wheezing or rhonchi.  Chest:     Chest wall: No tenderness.  Abdominal:     General: Bowel sounds are normal. There is no distension.     Palpations: Abdomen is soft.     Tenderness: There is no abdominal tenderness.  Musculoskeletal:        General: Normal range of motion.     Cervical back: Normal range of motion.     Right lower leg: No edema.     Left lower leg: No edema.  Skin:    General: Skin is warm and dry.     Capillary Refill: Capillary refill takes less than 2 seconds.  Neurological:     General: No focal deficit present.     Mental Status: He is alert and oriented to person, place, and time. Mental status is at baseline.  Psychiatric:        Mood and Affect: Mood normal.        Behavior: Behavior normal.        Thought Content: Thought content normal.        Judgment: Judgment normal.    ED Results / Procedures / Treatments   Labs (all labs ordered are listed, but only abnormal results are displayed) Labs Reviewed  BASIC METABOLIC PANEL - Abnormal;  Notable for the following components:      Result Value   Glucose, Bld 103 (*)    All other components within normal limits  PROTIME-INR - Abnormal; Notable for the following components:   Prothrombin Time 16.1 (*)    INR 1.3 (*)    All other components within normal limits  BRAIN NATRIURETIC PEPTIDE - Abnormal; Notable for the following  components:   B Natriuretic Peptide 175.0 (*)    All other components within normal limits  RESP PANEL BY RT-PCR (FLU A&B, COVID) ARPGX2  CBC  TROPONIN I (HIGH SENSITIVITY)  TROPONIN I (HIGH SENSITIVITY)   EKG EKG Interpretation  Date/Time:  Friday October 03 2021 12:09:05 EDT Ventricular Rate:  112 PR Interval:    QRS Duration: 90 QT Interval:  328 QTC Calculation: 447 R Axis:   64 Text Interpretation: Atrial fibrillation with rapid ventricular response ST & T wave abnormality, consider inferior ischemia Abnormal ECG When compared with ECG of 18-Aug-2021 15:55, No significant change was found Confirmed by Garnette Gunner [phone]) on 10/03/2021 1:52:21 PM  Radiology DG Chest 2 View  Result Date: 10/03/2021 CLINICAL DATA:  Chest pain, shortness of breath EXAM: CHEST - 2 VIEW COMPARISON:  08/18/2021 FINDINGS: Heart size within normal limits. Mildly hyperinflated lungs. Chronic linear scarring within the lung bases. No focal airspace consolidation, pleural effusion, or pneumothorax. No acute bony findings. IMPRESSION: Mildly hyperinflated lungs. No focal airspace consolidation. Electronically Signed   By: Davina Poke D.O.   On: 10/03/2021 12:39    Procedures Procedures   Medications Ordered in ED Medications  metoprolol tartrate (LOPRESSOR) injection 5 mg (5 mg Intravenous Given 10/03/21 1331)  metoprolol tartrate (LOPRESSOR) tablet 25 mg (25 mg Oral Given 10/03/21 1331)  sodium chloride 0.9 % bolus 500 mL (0 mLs Intravenous Stopped 10/03/21 1355)  metoprolol tartrate (LOPRESSOR) tablet 25 mg (25 mg Oral Given 10/03/21 1528)    ED Course/  Medical Decision Making/ A&P                           Medical Decision Making Amount and/or Complexity of Data Reviewed Labs: ordered. Radiology: ordered.  Risk Prescription drug management.  This patient presents to the ED with chief complaint(s) of shortness of breath with pertinent past medical history of COPD, HTN which further complicates the presenting complaint. The complaint involves an extensive differential diagnosis and treatment options and also carries with it a high risk of complications and morbidity.    The differential diagnosis includes Acute chest syndrome, stable angina, atypical angina, pulmonary embolism, pneumothorax, aortic dissection, pleural effusion, CHF, COPD, asthma, myocarditis, pericarditis, cardiac tamponade, chest wall pain    Additional history obtained: Additional history obtained from family Records reviewed Care Everywhere/External Records and Primary Care Documents most recent ED physician note   ED Course: Lab Tests: I Ordered, and personally interpreted labs.  The pertinent results include:   CBC within normal limits BMP within normal limits Troponin x2 negative BNP 175, only mildly elevated PT/INR only slightly elevated COVID/flu negative Imaging Studies: I ordered and independently visualized and interpreted the following imaging X-ray chest   which showed no acute findings The interpretation of the imaging was limited to assessing for emergent pathology, for which purpose it was ordered. Cardiac Monitoring: The patient was maintained on a cardiac monitor.  I personally viewed and interpreted the cardiac monitor which showed an underlying rhythm of:  Atrial Fibrillation Medicines ordered and prescription drug management: I ordered the following medications metoprolol and IVF for atrial fibrillation with RVR  I considered this additional medications: carvedilol  Reevaluation of the patient after these medicines showed that the patient     improved  Reassessment and review  71 year old male who presents to the emergency department with 2 to 3 days of shortness of breath.  On physical exam he is chronically ill-appearing but nontoxic, nonseptic  in appearance. He does appear very mildly short of breath but is in no acute respiratory distress.  There is no wheezing, stridor, rales or rhonchi on exam.  He is oxygenating well on room air. Irregular heart rhythm on auscultation Appears to be in A-fib with RVR on my initial exam with rates 110-120 Obtaining labs including troponin, BNP as well as chest x-ray.  We will give him IV and p.o. Lopressor and small bolus of fluid and reassess.  On reassessment his heart rate is improved to 80s to 90s.  When he does move around he gets into the low 100s.  States he feels okay and not significantly short of breath. Per attending MD, Garnette Gunner, will give another dose of 25 of Lopressor p.o.  Labs are unremarkable.  No evidence of infection.  No fever, leukocytosis or productive cough.  No evidence of pneumonia on chest x-ray.  No significant electrolyte dysfunction.  COVID and flu is negative.  Chest x-ray also shows no evidence of pleural effusion, pneumothorax. Doubt that this is CHF related.  BNP is only 175, no fluid on chest x-ray or rales on exam.  No lower extremity swelling. Troponin x2 is negative.  EKG without ischemia or infarction.  Doubt ACS. Considered but doubt PE.  Wells low risk, will not pursue D-dimer or CTA PE study. Does have a history of COPD however no increased productive cough, wheezing.  He does have an upcoming appointment with pulmonology.  They can evaluate his inhaler regiment which may need to be optimized for him.  He likely does have a COPD component to his shortness of breath however do not feel that this is primarily COPD exacerbation.  No history of asthma.  His symptoms are also inconsistent with other etiologies such as aortic dissection, tamponade,  myocarditis or pericarditis.  Has known history of A-fib.  Appears to be in RVR here.  May be lack of control with metoprolol 25 mg twice daily.  Have been able to get his heart rate under control here.  He is consistently staying 80-90.  He feels improved enough to go home and I feel this is reasonable.  The attending MD has also evaluated the patient at bedside and agrees that we can increase metoprolol to 50 twice daily and have him have close follow-up with his cardiologist.  He will also have strict return precautions should symptoms worsen.  Consultations Obtained: Not indicated   Complexity of problems addressed: Patient's presentation is most consistent with  exacerbation of chronic illness During patient's assessment  Disposition: After consideration of the diagnostic results and the patient's response to treatment,  I feel that the patent would benefit from discharge with close cardiology follow-up  .  Social Determinants of Health: Patient's  low health literacy   increases the complexity of managing their presentation  Final Clinical Impression(s) / ED Diagnoses Final diagnoses:  SOB (shortness of breath)  Atrial fibrillation with rapid ventricular response ([place])    Rx / DC Orders ED Discharge Orders          Ordered    metoprolol tartrate (LOPRESSOR) 50 MG tablet  2 times daily       Note to Pharmacy: Stopping Toprol, med changed 08/25/21   10/03/21 1612              Mickie Hillier, PA-C 10/03/21 1612    Cristie Hem, MD 10/04/21 [phone]

## 2021-10-04 ENCOUNTER — Ambulatory Visit (HOSPITAL_COMMUNITY)
Admission: EM | Admit: 2021-10-04 | Discharge: 2021-10-04 | Disposition: A | Discharge status: Home / Self Care | Payer: Medicare HMO | Attending: Psychiatry | Admitting: Psychiatry

## 2021-10-04 DIAGNOSIS — R451 Restlessness and agitation: Secondary | ICD-10-CM | POA: Insufficient documentation

## 2021-10-04 DIAGNOSIS — F319 Bipolar disorder, unspecified: Secondary | ICD-10-CM | POA: Insufficient documentation

## 2021-10-04 DIAGNOSIS — F4322 Adjustment disorder with anxiety: Secondary | ICD-10-CM | POA: Insufficient documentation

## 2021-10-04 NOTE — ED Notes (Signed)
Patient discharged by provider Tina Allen, NP with written and verbal instructions.  

## 2021-10-04 NOTE — ED Triage Notes (Signed)
Pt presents to [facility] accompanied by his wife due to suspected panic attacks. Pts wife reports trouble breathing, inability to sit still and vomiting for the past few days. Pt reports multiple health issues and the need for medication management.Pt denies SI/HI and AVH.

## 2021-10-04 NOTE — Discharge Instructions (Addendum)

## 2021-10-04 NOTE — ED Provider Notes (Signed)
Behavioral Health Urgent Care Medical Screening Exam  Patient Name: Jay [name] MRN: [id number] Date of Evaluation: 10/04/21 Chief Complaint:   Diagnosis:  Final diagnoses:  Adjustment disorder with anxious mood    History of Present illness: Jay [name] is a 71 y.o. male.  Patient prefers to be called "Jay [name]."  Patient presents voluntarily to [facility] behavioral health for walk-in assessment.  Patient is accompanied by his wife, Jay [name]. Patient prefers that Jay [name] remain present during assessment.    Patient is assessed, face-to-face, by nurse practitioner, seated in assessment area, no acute distress.  He  is alert and oriented, pleasant and cooperative during assessment. He presents with euthymic mood, congruent affect.  Jay [name] reports he has been diagnosed with anxiety and bipolar disorder, has been managed by primary care for approximately 15 years.  He met with new primary care provider who changed his duloxetine 60 mg to Wellbutrin XL 150 mg daily approximately 7 days ago.  For 5 days patient has experienced restlessness, decreased sleep, decreased appetite and increased tearful episodes.  He did speak with primary care provider on yesterday who discontinued Wellbutrin.  Patient restarted duloxetine 60 mg daily earlier this date states "I am feeling better, I feel more calm now."  Mental health history includes "a nervous breakdown" at age 63 after he was taken out of work.  He has been stable and followed by primary care for approximately 15 years.  Discussed plan to follow-up with outpatient psychiatry for medication management as well as individual counseling, patient verbalizes agreement with plan.  He denies history of inpatient psychiatric hospitalization.  No family mental health history reported.  Recent stressors include "family problems, flared up again this week.  Patient stepson "seems like he does not care" and patient's daughter has made negative statements toward  her father this week.  [place] do report a positive therapeutic relationship with several other children.  He denies suicidal and homicidal ideations. Denies history of suicide attempts, denies history of non suicidal self-harm behaviors.  Patient easily  contracts verbally for safety with this Probation officer.    Patient has normal speech and behavior.  He  denies auditory and visual hallucinations.  Patient is able to converse coherently with goal-directed thoughts and no distractibility or preoccupation.  Denies symptoms of paranoia.  Objectively there is no evidence of psychosis/mania or delusional thinking.  Jay [name] resides in [place] with his wife.  He is currently retired.  He denies alcohol and substance use.  Patient offered support and encouragement. Patient's wife, Jay [name], agrees with plan for discharge and follow-up with outpatient psychiatry.  [place] agree that weapons at home will be secured and stored away from their home.   Patient and family are educated and verbalize understanding of mental health resources and other crisis services in the community. They are instructed to call 911 and present to the nearest emergency room should patient experience any suicidal/homicidal ideation, auditory/visual/hallucinations, or detrimental worsening of mental health condition.     Psychiatric Specialty Exam  Presentation  General Appearance:Appropriate for Environment; Casual  Eye Contact:Good  Speech:Clear and Coherent; Normal Rate  Speech Volume:Normal  Handedness:Right   Mood and Affect  Mood:Euthymic  Affect:Appropriate; Congruent   Thought Process  Thought Processes:Coherent; Goal Directed; Linear  Descriptions of Associations:Intact  Orientation:Full (Time, Place and Person)  Thought Content:Logical; WDL    Hallucinations:None  Ideas of Reference:None  Suicidal Thoughts:No  Homicidal Thoughts:No   Sensorium  Memory:Immediate Good; Recent  Fair  Judgment:Fair  Insight:Fair   Executive Functions  Concentration:Good  Attention Span:Good  [place]  Language:Good   Psychomotor Activity  Psychomotor Activity:Normal   Assets  Assets:Communication Skills; Desire for Improvement; Financial Resources/Insurance; Housing; Intimacy; Leisure Time; Physical Health; Resilience; Social Support   Sleep  Sleep:Poor  Number of hours: No data recorded  No data recorded  Physical Exam: Physical Exam Vitals and nursing note reviewed.  Constitutional:      Appearance: Normal appearance. He is well-developed.  HENT:     Head: Normocephalic and atraumatic.     Nose: Nose normal.  Cardiovascular:     Rate and Rhythm: Normal rate.  Pulmonary:     Effort: Pulmonary effort is normal.  Musculoskeletal:        General: Normal range of motion.  Skin:    General: Skin is warm and dry.  Neurological:     Mental Status: He is alert and oriented to person, place, and time.  Psychiatric:        Attention and Perception: Attention and perception normal.        Mood and Affect: Mood and affect normal.        Speech: Speech normal.        Behavior: Behavior normal. Behavior is cooperative.        Thought Content: Thought content normal.        Cognition and Memory: Cognition normal.        Judgment: Judgment normal.    Review of Systems  Constitutional: Negative.   HENT: Negative.    Eyes: Negative.   Respiratory: Negative.    Cardiovascular: Negative.   Gastrointestinal: Negative.   Genitourinary: Negative.   Musculoskeletal: Negative.   Skin: Negative.   Neurological: Negative.   Psychiatric/Behavioral:  The patient has insomnia.    Blood pressure 138/86, pulse 100, resp. rate 18, SpO2 96 %. There is no height or weight on file to calculate BMI.  Musculoskeletal: Strength & Muscle Tone: within normal limits Gait & Station: normal Patient leans: N/A   [place] [facility] Discharge Disposition  for Follow up and Recommendations: Based on my evaluation the patient does not appear to have an emergency medical condition and can be discharged with resources and follow up care in outpatient services for Medication Management and Individual Therapy Patient reviewed with Dr. Lovette Cliche. Follow-up with outpatient psychiatry, resources provided. Medications: Bupropion discontinued, resume duloxetine 60 mg daily/mood.   Lucky Rathke, FNP 10/04/2021, 11:40 AM

## 2021-10-07 ENCOUNTER — Ambulatory Visit: Payer: Medicare HMO | Admitting: Adult Health

## 2021-10-14 ENCOUNTER — Telehealth: Payer: Self-pay | Admitting: Adult Health

## 2021-10-14 NOTE — Telephone Encounter (Signed)
Pt's daughter called to say Pt tested positive for COVID today.  Daughter is requesting something to treat the symptoms. Pt was offered an OV/VV and Daughter did say Pt could not talk.   LOV:  09/23/21 = New Pt Visit  [facility] - [place], [place] - [address] Phone:  [phone]  Fax:  [phone]

## 2021-10-15 NOTE — Telephone Encounter (Signed)
Pt does not want to do VV he does not feel like talking on the phone per wife

## 2021-10-16 ENCOUNTER — Ambulatory Visit: Payer: Medicare HMO | Admitting: Gastroenterology

## 2021-10-28 ENCOUNTER — Ambulatory Visit: Payer: Medicare HMO | Admitting: Student

## 2021-11-04 ENCOUNTER — Ambulatory Visit (INDEPENDENT_AMBULATORY_CARE_PROVIDER_SITE_OTHER): Payer: Medicare HMO | Admitting: Adult Health

## 2021-11-04 VITALS — BP 120/68 | HR 53 | Temp 98.0°F | Ht 68.0 in | Wt 205.0 lb

## 2021-11-04 DIAGNOSIS — F319 Bipolar disorder, unspecified: Secondary | ICD-10-CM | POA: Diagnosis not present

## 2021-11-04 DIAGNOSIS — F418 Other specified anxiety disorders: Secondary | ICD-10-CM | POA: Diagnosis not present

## 2021-11-04 NOTE — Progress Notes (Signed)
Subjective:    Patient ID: Jay [name], male    DOB: [birth year], 71 y.o.   MRN: [id number]  HPI 71 year old male who  has a past medical history of Anxiety, Arthritis, COPD (chronic obstructive pulmonary disease) ([place]), Depression, GERD (gastroesophageal reflux disease), Hypertension, and Shortness of breath.  He presents to the office today for 1 month follow-up guarding anxiety and depression  When he was seen for his establish care visit a month ago he reported that he was currently managed with Lamictal 200 mg twice daily and Cymbalta 60 mg daily and did not feel as though this medication had been working.  Prior to that he was on Lexapro 20 mg twice daily as well as Xanax but this was not working well either.  He has a long history of anxiety and depression and has been on multiple medications in the past but cannot remember which ones he has been on.  We started him on Wellbutrin 150 mg daily, kept him on Lamictal and discontinued Cymbalta.  About a week later we received a phone call that they will be calling panic, short of breath and unable to sit still.  This time he was advised to stop Wellbutrin and restarted Cymbalta  10/04/2021 he presented to [facility] behavioral health for a walk-in assessment.  He was not suicidal or homicidal is actually feeling better since restarting the Cymbalta.  He was advised of outpatient psychiatry follow-up  Today he reports that he is feeling better but he continues to feel depressed. He is interested in seeing a psychiatrist   Review of Systems See HPI   Past Medical History:  Diagnosis Date   Anxiety    Arthritis    COPD (chronic obstructive pulmonary disease) (HCC)    Depression    GERD (gastroesophageal reflux disease)    Hypertension    Shortness of breath    with excertion    Social History   Socioeconomic History   Marital status: Married    Spouse name: Not on file   Number of children: Not on file   Years of  education: Not on file   Highest education level: Not on file  Occupational History   Not on file  Tobacco Use   Smoking status: Former    Types: Cigarettes    Quit date: 01/12/1993    Years since quitting: 28.8   Smokeless tobacco: Never  Vaping Use   Vaping Use: Never used  Substance and Sexual Activity   Alcohol use: No    Comment: Quit 1995   Drug use: No   Sexual activity: Not on file  Other Topics Concern   Not on file  Social History Narrative   Not on file   Social Determinants of Health   Financial Resource Strain: Not on file  Food Insecurity: Not on file  Transportation Needs: Not on file  Physical Activity: Not on file  Stress: Not on file  Social Connections: Not on file  Intimate Partner Violence: Not on file    Past Surgical History:  Procedure Laterality Date   APPENDECTOMY     EYE SURGERY      Family History  Problem Relation Age of Onset   Hypertension Mother    Cancer Father    Hypertension Father    Hypertension Sister    Cancer Brother    Hypertension Brother     No Known Allergies  Current Outpatient Medications on File Prior to Visit  Medication Sig  Dispense Refill   acetaminophen (TYLENOL) 500 MG tablet Take 1,000 mg by mouth every 6 (six) hours as needed for moderate pain.     albuterol (VENTOLIN HFA) 108 (90 Base) MCG/ACT inhaler Inhale 2 puffs into the lungs every 6 (six) hours as needed for wheezing or shortness of breath.     amLODipine (NORVASC) 5 MG tablet Take 5 mg by mouth every morning.     apixaban (ELIQUIS) 5 MG TABS tablet Take 1 tablet (5 mg total) by mouth 2 (two) times daily. 60 tablet 0   atorvastatin (LIPITOR) 20 MG tablet Take 20 mg by mouth daily.     buPROPion (WELLBUTRIN XL) 150 MG 24 hr tablet Take 1 tablet (150 mg total) by mouth daily. 90 tablet 0   donepezil (ARICEPT) 10 MG tablet Take 10 mg by mouth at bedtime.     esomeprazole (NEXIUM) 40 MG capsule Take 40 mg by mouth daily at 12 noon.     famotidine  (PEPCID) 20 MG tablet Take 20 mg by mouth daily.     lamoTRIgine (LAMICTAL) 200 MG tablet Take 200 mg by mouth daily.     magnesium oxide (MAG-OX) 400 MG tablet Take 400 mg by mouth daily.     metoprolol tartrate (LOPRESSOR) 50 MG tablet Take 1 tablet (50 mg total) by mouth 2 (two) times daily. 60 tablet 1   Multiple Vitamin (MULTIVITAMIN) tablet Take 1 tablet by mouth daily.     No current facility-administered medications on file prior to visit.    BP 120/68   Pulse (!) 53   Temp 98 F (36.7 C) (Oral)   Ht '5\' 8"'$  (1.727 m)   Wt 205 lb (93 kg)   SpO2 97%   BMI 31.17 kg/m       Objective:   Physical Exam Vitals and nursing note reviewed.  Constitutional:      Appearance: Normal appearance.  Cardiovascular:     Rate and Rhythm: Normal rate and regular rhythm.     Pulses: Normal pulses.     Heart sounds: Normal heart sounds.  Pulmonary:     Effort: Pulmonary effort is normal.     Breath sounds: Normal breath sounds.  Musculoskeletal:        General: Normal range of motion.  Skin:    General: Skin is warm and dry.  Neurological:     General: No focal deficit present.     Mental Status: He is alert and oriented to person, place, and time.  Psychiatric:        Mood and Affect: Mood normal.        Behavior: Behavior normal.        Thought Content: Thought content normal.        Judgment: Judgment normal.       Assessment & Plan:  1. Depression with anxiety  - Ambulatory referral to Psychiatry  2. Bipolar affective disorder, remission status unspecified ([place])  - Ambulatory referral to Psychiatry  Dorothyann Peng, NP

## 2021-11-05 NOTE — Progress Notes (Unsigned)
GI Office Note    Referring Provider: Dorothyann Peng, NP Primary Care Physician:  Dorothyann Peng, NP  Primary Gastroenterologist: Cristopher Estimable.Rourk, MD  Chief Complaint   Chief Complaint  Patient presents with   Colonoscopy    Pt referred for procedure and he is on blood thinner.    History of Present Illness   Jay [name] is a 71 y.o. male presenting today at the request of Dorothyann Peng, NP for screening colonoscopy.  Last colonoscopy in 2013 by [facility]: Left-sided diverticula, 3 subcentimeter sigmoid polyps.  Stool plug and residual appendiceal stump.  Polyps consistent with tubular adenomas. (Per PCP report, patient was to follow-up in 5 years) Dr. Paulita Fujita  EGD at time of TCS in 2013: Moderate hiatal hernia, normal esophagus, normal stomach and duodenum.  GERD has been managed with Nexium 40 mg daily.  Follows with cardiology regarding A-fib.  Recently underwent stress test in August 2023 for evaluation of chest pain.  Stress test was normal.  Echocardiogram in February 2023 with a EF of 55 to 60%.  Grade 1 diastolic dysfunction.  Mildly enlarged RV.  No significant valvular abnormalities.  Today:  GERD is so-so. Taking nexium 40 mg daily. Sometimes takes on an empty stomach. Only have breakthrough symptoms about once per month. Denies dysphagia. Denies lack of appetite, unintentional weight loss, early satiety, melena, BRBPR, constipation, diarrhea, abdominal pain.   Denies bleeding episodes, chest pain. Has COPD and has baseline shortness of breath. Not on oxygen. Denies LE edema.   Reports he went to the ED about a month ago due to a reaction to wellbutrin. Reports his wife just had kidney stones and has a history of colon cancer.   States he did have a fall in February with loss of consciousness.  He reported a small brain bleed but has been doing well overall since that time.   Current Outpatient Medications  Medication Sig Dispense Refill   acetaminophen  (TYLENOL) 500 MG tablet Take 1,000 mg by mouth every 6 (six) hours as needed for moderate pain.     albuterol (VENTOLIN HFA) 108 (90 Base) MCG/ACT inhaler Inhale 2 puffs into the lungs every 6 (six) hours as needed for wheezing or shortness of breath.     amLODipine (NORVASC) 5 MG tablet Take 5 mg by mouth every morning.     apixaban (ELIQUIS) 5 MG TABS tablet Take 1 tablet (5 mg total) by mouth 2 (two) times daily. 60 tablet 0   atorvastatin (LIPITOR) 20 MG tablet Take 20 mg by mouth daily.     donepezil (ARICEPT) 10 MG tablet Take 10 mg by mouth at bedtime.     esomeprazole (NEXIUM) 40 MG capsule Take 40 mg by mouth daily at 12 noon.     magnesium oxide (MAG-OX) 400 MG tablet Take 400 mg by mouth daily.     metoprolol tartrate (LOPRESSOR) 50 MG tablet Take 1 tablet (50 mg total) by mouth 2 (two) times daily. 60 tablet 1   Multiple Vitamin (MULTIVITAMIN) tablet Take 1 tablet by mouth daily.     No current facility-administered medications for this visit.    Past Medical History:  Diagnosis Date   Anxiety    Arthritis    COPD (chronic obstructive pulmonary disease) (HCC)    Depression    GERD (gastroesophageal reflux disease)    Hypertension    Shortness of breath    with excertion    Past Surgical History:  Procedure Laterality Date  APPENDECTOMY     EYE SURGERY      Family History  Problem Relation Age of Onset   Hypertension Mother    Cancer Father    Hypertension Father    Hypertension Sister    Cancer Brother    Hypertension Brother    Cancer - Colon Neg Hx    Colon polyps Neg Hx     Allergies as of 11/06/2021 - Review Complete 11/06/2021  Allergen Reaction Noted   Wellbutrin [bupropion]  11/06/2021    Social History   Socioeconomic History   Marital status: Married    Spouse name: Not on file   Number of children: Not on file   Years of education: Not on file   Highest education level: Not on file  Occupational History   Not on file  Tobacco Use    Smoking status: Former    Types: Cigarettes    Quit date: 01/12/1993    Years since quitting: 28.8   Smokeless tobacco: Never  Vaping Use   Vaping Use: Never used  Substance and Sexual Activity   Alcohol use: No    Comment: Quit 1995   Drug use: No   Sexual activity: Not on file  Other Topics Concern   Not on file  Social History Narrative   Not on file   Social Determinants of Health   Financial Resource Strain: Not on file  Food Insecurity: Not on file  Transportation Needs: Not on file  Physical Activity: Not on file  Stress: Not on file  Social Connections: Not on file  Intimate Partner Violence: Not on file     Review of Systems   Gen: Denies any fever, chills, fatigue, weight loss, lack of appetite.  CV: Denies chest pain, heart palpitations, peripheral edema, syncope.  Resp: + shortness of breath with exertion. Denies wheezing or cough.  GI: see HPI GU : Denies urinary burning, urinary frequency, urinary hesitancy MS: Denies joint pain, muscle weakness, cramps, or limitation of movement.  Derm: Denies rash, itching, dry skin Psych: Denies depression, anxiety, memory loss, and confusion Heme: Denies bruising, bleeding, and enlarged lymph nodes.   Physical Exam   BP 130/84 (BP Location: Left Arm, Patient Position: Sitting, Cuff Size: Normal)   Pulse 93   Temp (!) 97.2 F (36.2 C) (Temporal)   Ht 5' 8.5" (1.74 m)   Wt 205 lb (93 kg)   SpO2 99%   BMI 30.72 kg/m   General:   Alert and oriented. Pleasant and cooperative. Well-nourished and well-developed.  Head:  Normocephalic and atraumatic. Eyes: Conjunctive pink, no scleral icterus.  Lazy left eye Ears:  Normal auditory acuity. Mouth:  No deformity or lesions, oral mucosa pink.  Lungs:  Clear to auscultation bilaterally. No wheezes, rales, or rhonchi. No distress.  Heart:  irregularly irregular.  Abdomen:  +BS, soft, non-tender and non-distended.  Possible small ventral hernia, soft and reducible. No HSM  noted. No guarding or rebound. Rectal:  Deferred  Msk:  Symmetrical without gross deformities. Normal posture. Extremities:  Without edema. Neurologic:  Alert and  oriented x4; symmetrical strength bilaterally. Skin:  Intact without significant lesions or rashes.  Scattered bruising. Psych:  Alert and cooperative. Normal mood and affect.   Assessment   Jay [name] is a 71 y.o. male with a history of anxiety, arthritis, A-fib on Eliquis, COPD, depression, GERD, and HTN presenting today to schedule surveillance colonoscopy.   History of adenomatous colon polyps: Last colonoscopy 2013 by [facility].  Presence of 3 tubular adenomas sigmoid colon and left-sided diverticulosis. Currently without any alarm symptoms. No upper or lower GI complaints other than his chronic GERD. Has baseline shortness of breath due to COPD, not worsening at the moment. Will proceed with surveillance colonoscopy. Medication adjustments discussed.   GERD: Fairly well controlled on Nexium 40 mg daily.  Reports breakthrough symptoms about once or twice a month.  Symptoms are usually relieved with Tums or Rolaids.  PLAN   Proceed with colonoscopy with propofol by Dr. Gala Romney in near future: the risks, benefits, and alternatives have been discussed with the patient in detail. The patient states understanding and desires to proceed.  ASA 3 Cardiac clearance to hold Eliquis for 2 days prior to procedure Continue esomeprazole 40 mg daily.  GERD diet/lifestyle modifications Follow up as needed.    Venetia Night, MSN, FNP-BC, AGACNP-BC [facility]

## 2021-11-06 ENCOUNTER — Encounter: Payer: Self-pay | Admitting: Gastroenterology

## 2021-11-06 ENCOUNTER — Ambulatory Visit (INDEPENDENT_AMBULATORY_CARE_PROVIDER_SITE_OTHER): Payer: Medicare HMO | Admitting: Gastroenterology

## 2021-11-06 ENCOUNTER — Telehealth: Payer: Self-pay | Admitting: *Deleted

## 2021-11-06 VITALS — BP 130/84 | HR 93 | Temp 97.2°F | Ht 68.5 in | Wt 205.0 lb

## 2021-11-06 DIAGNOSIS — Z86010 Personal history of colonic polyps: Secondary | ICD-10-CM | POA: Diagnosis not present

## 2021-11-06 DIAGNOSIS — K219 Gastro-esophageal reflux disease without esophagitis: Secondary | ICD-10-CM

## 2021-11-06 NOTE — Patient Instructions (Addendum)
We are scheduling you for colonoscopy in the near future with Dr. Gala Romney.  We will be in touch to schedule once we have received clearance from your cardiologist to hold her Eliquis for 2 days.  You will receive separate written instructions for your colonoscopy in the mail.  Continue taking your Nexium 40 mg daily. Follow a GERD diet:  Avoid fried, fatty, greasy, spicy, citrus foods. Avoid caffeine and carbonated beverages. Avoid chocolate. Try eating 4-6 small meals a day rather than 3 large meals. Do not eat within 3 hours of laying down. Prop head of bed up on wood or bricks to create a 6 inch incline.  It was a pleasure to see you today. I want to create trusting relationships with patients. If you receive a survey regarding your visit,  I greatly appreciate you taking time to fill this out on paper or through your MyChart. I value your feedback.  Venetia Night, MSN, FNP-BC, AGACNP-BC [facility]

## 2021-11-06 NOTE — Telephone Encounter (Signed)
  Request for patient to stop medication prior to procedure or is needing cleareance  11/06/21  [place] 06/23/1950  What type of surgery is being performed? Colonoscopy  When is surgery scheduled? TBD  What type of clearance is required (medical or pharmacy to hold medication or both? Hold medication  Are there any medications that need to be held prior to surgery and how long? Eliquis for 2 days prior to procedure.  Name of physician performing surgery?  Dr. [facility] Phone: [phone] Fax: [phone]  Anethesia type (none, local, MAC, general)? MAC

## 2021-11-06 NOTE — Telephone Encounter (Signed)
On Eliquis due to atrial fibrillation. Will route to pharmacy team for input.   CHA2DS2-VASc Score = 2   This indicates a 2.2% annual risk of stroke. The patient's score is based upon: CHF History: 0 HTN History: 1 Diabetes History: 0 Stroke History: 0 Vascular Disease History: 0 Age Score: 1 Gender Score: 0     Platelets: 249 (10/03/21)  Creatinine clearance: 63 mL/min (adjusted for weight - 74 mL/min if not adjusted for weight) based on labs 10/03/21  Loel Dubonnet, NP

## 2021-11-07 NOTE — Telephone Encounter (Signed)
   Patient Name: Jay [name]  DOB: 1950/08/27 MRN: [id number]  Primary Cardiologist: Carlyle Dolly, MD  Chart reviewed as part of pre-operative protocol coverage. Pharmacy clearance only. Per office protocols and pharmacist review  Jay [name] may hold Eliquis 2 days prior to planned procedure.   I will route this recommendation to the requesting party via Epic fax function and remove from pre-op pool.  Please call with questions.  Loel Dubonnet, NP 11/07/2021, 10:50 AM

## 2021-11-07 NOTE — Telephone Encounter (Signed)
Patient with diagnosis of afib on Eliquis for anticoagulation.    Procedure: colonoscopy Date of procedure: TBD   CHA2DS2-VASc Score = 2   This indicates a 2.2% annual risk of stroke. The patient's score is based upon: CHF History: 0 HTN History: 1 Diabetes History: 0 Stroke History: 0 Vascular Disease History: 0 Age Score: 1 Gender Score: 0      CrCl 74 ml/min Platelet count 249  Per office protocol, patient can hold Eliquis for 2 days prior to procedure.    **This guidance is not considered finalized until pre-operative APP has relayed final recommendations.**

## 2021-11-11 ENCOUNTER — Encounter: Payer: Self-pay | Admitting: Pulmonary Disease

## 2021-11-11 ENCOUNTER — Ambulatory Visit: Payer: Medicare HMO | Admitting: Pulmonary Disease

## 2021-11-11 DIAGNOSIS — R0609 Other forms of dyspnea: Secondary | ICD-10-CM | POA: Diagnosis not present

## 2021-11-11 MED ORDER — TRELEGY ELLIPTA 100-62.5-25 MCG/ACT IN AEPB: 1.0000 | INHALATION_SPRAY | Freq: Every day | RESPIRATORY_TRACT | 0 refills | Status: DC

## 2021-11-11 NOTE — Assessment & Plan Note (Addendum)
Unclear whether this is cardiac or pulmonary etiology.  He is a remote heavy smoker but has been quit for more than 25 years.  There is no obvious ILD but chest x-ray shows minimal linear scarring, I do not hear any crackles on exam. He does not have pedal edema to indicate cor pulmonale or pulm hypertension.  EF was slightly decreased on nuclear scan and this may need further investigation with an echocardiogram given A-fib is an active issue. We will also investigate with high-resolution CT chest to rule out ILD and obtain PFTs to look for airway obstruction

## 2021-11-11 NOTE — Patient Instructions (Addendum)
  X AMb sat  X HRCT chest  X PFTs  X schedule echocardiogram   X Sample of trelegy to use once daily as needed - rinse mouth after use , call me for Rx if this works

## 2021-11-11 NOTE — Progress Notes (Signed)
Subjective:    Patient ID: Jay [name], male    DOB: 1950/10/08, 71 y.o.   MRN: [id number]  HPI   Chief Complaint  Patient presents with   Consult    He  is having some shortness of breath with exertion, and rest, has some dry cough, and wheezing, he feels constant throughout the day.     Jay [name] is a 71 year old remote smoker who presents for evaluation of dyspnea on exertion.  He reports ongoing dyspnea for about a year, especially on activity, progressive and now he feels it even on activities around the house such as taking a shower or dressing. Is worse when he lies on his left side.  Albuterol MDI has provided some relief, he denies seasonal variation. He also reports a dry cough and nocturnal wheezing for the past few months.  He has been told that he has COPD but never been objectively confirmed by PFTs. He smoked up to 3 packs/day starting as a teenager until he quit in 1995, more than 50 pack years.  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  He lived in [place] and in [place] for the past 15 years  [place] -atrial fibrillation Hypertension 02/2021 syncope and fall with intraventricular hemorrhage and occipital fracture , now back on anticoagulation  I have reviewed last cardiology consultation Chest x-ray 09/2021 shows linear scarring at the bases , mild hyperinflation.  Echo 02/2021 shows normal LV function. 08/2021 myocardial perfusion study -EF 45%, no evidence of ischemia      Past Medical History:  Diagnosis Date   Anxiety    Arthritis    COPD (chronic obstructive pulmonary disease) (HCC)    Depression    GERD (gastroesophageal reflux disease)    Hypertension    Shortness of breath    with excertion   Past Surgical History:  Procedure Laterality Date   APPENDECTOMY     EYE SURGERY      Allergies  Allergen Reactions   Wellbutrin [Bupropion]     Hyper, nervous and couldn't sleep    Social History   Socioeconomic History    Marital status: Married    Spouse name: Not on file   Number of children: Not on file   Years of education: Not on file   Highest education level: Not on file  Occupational History   Not on file  Tobacco Use   Smoking status: Former    Types: Cigarettes    Quit date: 01/12/1993    Years since quitting: 28.8   Smokeless tobacco: Never  Vaping Use   Vaping Use: Never used  Substance and Sexual Activity   Alcohol use: No    Comment: Quit 1995   Drug use: No   Sexual activity: Not on file  Other Topics Concern   Not on file  Social History Narrative   Not on file   Social Determinants of Health   Financial Resource Strain: Not on file  Food Insecurity: Not on file  Transportation Needs: Not on file  Physical Activity: Not on file  Stress: Not on file  Social Connections: Not on file  Intimate Partner Violence: Not on file    Family History  Problem Relation Age of Onset   Hypertension Mother    Cancer Father    Hypertension Father    Hypertension Sister    Cancer Brother    Hypertension Brother    Cancer - Colon Neg Hx    Colon polyps  Neg Hx       Review of Systems Nasal congestion Anxiety and depression  Constitutional: negative for anorexia, fevers and sweats  Eyes: negative for irritation, redness and visual disturbance  Ears, nose, mouth, throat, and face: negative for earaches, epistaxis, nasal congestion and sore throat  Cardiovascular: negative for chest pain, lower extremity edema, orthopnea, palpitations and syncope  Gastrointestinal: negative for abdominal pain, constipation, diarrhea, melena, nausea and vomiting  Genitourinary:negative for dysuria, frequency and hematuria  Hematologic/lymphatic: negative for bleeding, easy bruising and lymphadenopathy  Musculoskeletal:negative for arthralgias, muscle weakness and stiff joints  Neurological: negative for coordination problems, gait problems, headaches and weakness  Endocrine: negative for diabetic  symptoms including polydipsia, polyuria and weight loss     Objective:   Physical Exam  Gen. Pleasant, well-nourished, in no distress, normal affect ENT - no pallor,icterus, no post nasal drip Neck: No JVD, no thyromegaly, no carotid bruits Lungs: no use of accessory muscles, no dullness to percussion, clear without rales or rhonchi  Cardiovascular: Rhythm irregular, heart sounds  normal, no murmurs or gallops, no peripheral edema Abdomen: soft and non-tender, no hepatosplenomegaly, BS normal. Musculoskeletal: No deformities, no cyanosis or clubbing Neuro:  alert, non focal       Assessment & Plan:   COPD -previously diagnosed but never had PFTs. We will obtain PFTs to confirm. I gave him a sample of Trelegy to trial while we are undergoing these tests and we will decide to continue based on PFTs. He can continue to use albuterol on an as-needed basis

## 2021-11-11 NOTE — Addendum Note (Signed)
Addended by: Dessie Coma on: 11/11/2021 09:46 AM   Modules accepted: Orders

## 2021-11-11 NOTE — Telephone Encounter (Signed)
Will call once get schedule for Dec.

## 2021-11-14 ENCOUNTER — Encounter: Payer: Self-pay | Admitting: *Deleted

## 2021-11-14 ENCOUNTER — Telehealth: Payer: Self-pay | Admitting: *Deleted

## 2021-11-14 MED ORDER — PEG 3350-KCL-NA BICARB-NACL 420 G PO SOLR: 4000.0000 mL | Freq: Once | ORAL | 0 refills | Status: AC

## 2021-11-14 NOTE — Telephone Encounter (Signed)
Called pt and he has been scheduled for TCA, asa 3 at 12/6 at 10:45am. Aware to hold eliquis x 2 days prior. Will mail prep instructions/pre-op appt. Rx for prep sent to pharmacy   PA approved via cohere. Auth# [id number], DOS: 12/17/2021 - 03/17/2022

## 2021-11-27 ENCOUNTER — Ambulatory Visit (HOSPITAL_COMMUNITY)
Admission: RE | Admit: 2021-11-27 | Discharge: 2021-11-27 | Disposition: A | Discharge status: Home / Self Care | Payer: Medicare HMO | Source: Ambulatory Visit | Attending: Pulmonary Disease | Admitting: Pulmonary Disease

## 2021-11-27 ENCOUNTER — Ambulatory Visit: Payer: Medicare HMO | Admitting: Student

## 2021-11-27 ENCOUNTER — Encounter: Payer: Self-pay | Admitting: Student

## 2021-11-27 VITALS — BP 128/80 | HR 87 | Ht 68.5 in | Wt 209.0 lb

## 2021-11-27 DIAGNOSIS — I6523 Occlusion and stenosis of bilateral carotid arteries: Secondary | ICD-10-CM | POA: Diagnosis not present

## 2021-11-27 DIAGNOSIS — R0609 Other forms of dyspnea: Secondary | ICD-10-CM

## 2021-11-27 DIAGNOSIS — R0602 Shortness of breath: Secondary | ICD-10-CM | POA: Insufficient documentation

## 2021-11-27 DIAGNOSIS — Z87898 Personal history of other specified conditions: Secondary | ICD-10-CM | POA: Insufficient documentation

## 2021-11-27 DIAGNOSIS — I10 Essential (primary) hypertension: Secondary | ICD-10-CM | POA: Diagnosis not present

## 2021-11-27 DIAGNOSIS — I480 Paroxysmal atrial fibrillation: Secondary | ICD-10-CM | POA: Diagnosis not present

## 2021-11-27 LAB — ECHOCARDIOGRAM COMPLETE
AR max vel: 2.27 cm2
AV Area VTI: 2.67 cm2
AV Area mean vel: 2.3 cm2
AV Mean grad: 2 mmHg
AV Peak grad: 4.6 mmHg
Ao pk vel: 1.08 m/s
Area-P 1/2: 4.06 cm2
MV VTI: 3.1 cm2
S' Lateral: 2.9 cm

## 2021-11-27 MED ORDER — FUROSEMIDE 20 MG PO TABS: ORAL_TABLET | ORAL | 3 refills | Status: DC

## 2021-11-27 NOTE — Patient Instructions (Signed)
Medication Instructions:  Take Lasix 20 mg daily as needed for weight gain over 2 lbs in 24 hours, or, 5 lbs in 1 week   Take for 3 days in a row and then use as needed   Labwork: None today  Testing/Procedures: None today  Follow-Up: 6 months  Any Other Special Instructions Will Be Listed Below (If Applicable).  If you need a refill on your cardiac medications before your next appointment, please call your pharmacy.

## 2021-11-27 NOTE — Progress Notes (Signed)
Cardiology Office Note    Date:  11/27/2021   ID:  Jay, [name] 1950/06/25, MRN [id number]  PCP:  Dorothyann Peng, NP  Cardiologist: Carlyle Dolly, MD    Chief Complaint  Patient presents with   Follow-up    3 month visit    History of Present Illness:    Jay [name] is a 71 y.o. male with past medical history of paroxysmal atrial fibrillation, carotid artery stenosis, HTN, COPD and history of chest pain who presents to the office today for 8-monthfollow-up.  He was examined by Dr. BHarl Bowiein 08/2021 and reported occasional episodes of chest pain which could occur at rest or with activity. A Lexiscan Myoview was recommended for further assessment. This showed no perfusion defects consistent with infarct or current ischemia. EF was read as 45% but was previously normal by echocardiogram earlier this year. A follow-up echocardiogram was ordered by Pulmonology and performed today. This showed a preserved ejection fraction of 55 to 60% with normal RV function and no significant valve abnormalities. It appears they also ordered a High-resolution Chest CT along with PFT's which are both pending.  In talking with the patient and his wife today, he reports having dyspnea at rest and with activity for several years. Says his breathing has actually improved over the past week and he is unsure of the cause for this. He denies any chest pain resembling his prior symptoms earlier this year.  No recent palpitations. He does mention having to sleep with several pillows at night or needing to sleep in the recliner. Also reports some abdominal distention and intermittent lower extremity edema. He was recently switched to Trelegy by Pulmonology but says he has only been using this on occasion instead of daily as prescribed.   Past Medical History:  Diagnosis Date   Anxiety    Arthritis    COPD (chronic obstructive pulmonary disease) (HCC)    Depression    GERD (gastroesophageal reflux  disease)    Hypertension    PAF (paroxysmal atrial fibrillation) (HCC)    Shortness of breath    with excertion    Past Surgical History:  Procedure Laterality Date   APPENDECTOMY     EYE SURGERY      Current Medications: Outpatient Medications Prior to Visit  Medication Sig Dispense Refill   acetaminophen (TYLENOL) 500 MG tablet Take 1,000 mg by mouth every 6 (six) hours as needed for moderate pain.     albuterol (VENTOLIN HFA) 108 (90 Base) MCG/ACT inhaler Inhale 2 puffs into the lungs every 6 (six) hours as needed for wheezing or shortness of breath.     amLODipine (NORVASC) 5 MG tablet Take 5 mg by mouth every morning.     apixaban (ELIQUIS) 5 MG TABS tablet Take 1 tablet (5 mg total) by mouth 2 (two) times daily. 60 tablet 0   atorvastatin (LIPITOR) 20 MG tablet Take 20 mg by mouth daily.     donepezil (ARICEPT) 10 MG tablet Take 10 mg by mouth at bedtime.     esomeprazole (NEXIUM) 40 MG capsule Take 40 mg by mouth daily at 12 noon.     Fluticasone-Umeclidin-Vilant (TRELEGY ELLIPTA) 100-62.5-25 MCG/ACT AEPB Inhale 1 puff into the lungs daily. 60 each 0   magnesium oxide (MAG-OX) 400 MG tablet Take 400 mg by mouth daily.     metoprolol tartrate (LOPRESSOR) 50 MG tablet Take 1 tablet (50 mg total) by mouth 2 (two) times daily. 60 tablet 1  Multiple Vitamin (MULTIVITAMIN) tablet Take 1 tablet by mouth daily.     No facility-administered medications prior to visit.     Allergies:   Wellbutrin [bupropion]   Social History   Socioeconomic History   Marital status: Married    Spouse name: Not on file   Number of children: Not on file   Years of education: Not on file   Highest education level: Not on file  Occupational History   Not on file  Tobacco Use   Smoking status: Former    Types: Cigarettes    Quit date: 01/12/1993    Years since quitting: 28.8   Smokeless tobacco: Never  Vaping Use   Vaping Use: Never used  Substance and Sexual Activity   Alcohol use: No     Comment: Quit 1995   Drug use: No   Sexual activity: Not on file  Other Topics Concern   Not on file  Social History Narrative   Not on file   Social Determinants of Health   Financial Resource Strain: Not on file  Food Insecurity: Not on file  Transportation Needs: Not on file  Physical Activity: Not on file  Stress: Not on file  Social Connections: Not on file     Family History:  The patient's family history includes Cancer in his brother and father; Hypertension in his brother, father, mother, and sister.   Review of Systems:    Please see the history of present illness.     All other systems reviewed and are otherwise negative except as noted above.   Physical Exam:    VS:  BP 128/80   Pulse 87   Ht 5' 8.5" (1.74 m)   Wt 209 lb (94.8 kg)   SpO2 95%   BMI 31.32 kg/m    General: Well developed, well nourished,male appearing in no acute distress. Head: Normocephalic, atraumatic. Neck: Right carotid bruit. JVD not elevated.  Lungs: Respirations regular and unlabored, without wheezes or rales.  Heart: Irregularly irregular. No S3 or S4.  No murmur, no rubs, or gallops appreciated. Abdomen: Appears non-distended. No obvious abdominal masses. Msk:  Strength and tone appear normal for age. No obvious joint deformities or effusions. Extremities: No clubbing or cyanosis. No pitting edema.  Distal pedal pulses are 2+ bilaterally. Neuro: Alert and oriented X 3. Moves all extremities spontaneously. No focal deficits noted. Psych:  Responds to questions appropriately with a normal affect. Skin: No rashes or lesions noted  Wt Readings from Last 3 Encounters:  11/27/21 209 lb (94.8 kg)  11/11/21 201 lb (91.2 kg)  11/06/21 205 lb (93 kg)     Studies/Labs Reviewed:   EKG:  EKG is not ordered today.   Recent Labs: 05/02/2021: Magnesium 1.6; TSH 1.821 08/18/2021: ALT 20 10/03/2021: B Natriuretic Peptide 175.0; BUN 18; Creatinine, Ser 1.20; Hemoglobin 14.7; Platelets 249;  Potassium 4.2; Sodium 143   Lipid Panel No results found for: "CHOL", "TRIG", "HDL", "CHOLHDL", "VLDL", "LDLCALC", "LDLDIRECT"  Additional studies/ records that were reviewed today include:   Echocardiogram: 02/2021 IMPRESSIONS     1. Left ventricular ejection fraction, by estimation, is 55 to 60%. The  left ventricle has normal function. The left ventricle has no regional  wall motion abnormalities. Left ventricular diastolic parameters are  consistent with Grade I diastolic  dysfunction (impaired relaxation).   2. Right ventricular systolic function is normal. The right ventricular  size is mildly enlarged.   3. Left atrial size was mildly dilated.   4. The  mitral valve is normal in structure. Trivial mitral valve  regurgitation. No evidence of mitral stenosis.   5. The aortic valve is normal in structure. Aortic valve regurgitation is  not visualized. No aortic stenosis is present.   6. The inferior vena cava is normal in size with greater than 50%  respiratory variability, suggesting right atrial pressure of 3 mmHg.   Carotid Dopplers: 03/2021 IMPRESSION: 1. Moderate (50-69%) stenosis proximal right internal carotid artery secondary to heterogenous atherosclerotic plaque. 2. Mild (1-49%) stenosis proximal left internal carotid artery secondary to heterogenous atherosclerotic plaque. 3. Vertebral arteries are patent with normal antegrade flow. 4. Upper extremity blood pressure discrepancy with the right brachial pressure 25 mm Hg less than the left. This suggests an underlying stenosis of the right innominate or subclavian arteries.  Event Monitor: 06/2021 30 day monitor Min HR 62, Max HR 171, Avg HR 88 Tele tracings show afib variable rates. One episode of symptoms correlated with afib rate 100 isolated PVC  NST: 08/2021   The study is normal, there are no perfusion defects consistent with prior infarct or current ischemia. The study is intermediate risk purely based  on LVEF 45%, consider echo to better evaluate LV function   No ST deviation was noted.   LV perfusion is normal.   Left ventricular function is abnormal. Nuclear stress EF: 45 %. The left ventricular ejection fraction is mildly decreased (45-54%). End diastolic cavity size is normal.  Assessment:    1. SOB (shortness of breath)   2. PAF (paroxysmal atrial fibrillation) ([place])   3. Bilateral carotid artery stenosis   4. History of chest pain   5. Essential hypertension      Plan:   In order of problems listed above:  1. Dyspnea on Exertion/Orthopnea - Unclear etiology as his cardiac work-up has been reassuring thus far. Recent NST showed no ischemia or infarction but EF was read as being reduced but normal by echocardiogram performed earlier today. In reviewing labs, his BNP was elevated in 09/2021 and he does report orthopnea along with intermittent abdominal distention and lower extremity edema. I recommended that he try taking Lasix 20 mg for the next 3 days and then only as needed for weight gain or worsening edema going forward. - He is being followed by Pulmonology with plans for a high-resolution Chest CT and PFT's. We did review that he should be taking Trelegy daily.  2. Paroxysmal Atrial Fibrillation - He denies any recent palpitations and his heart rate is well controlled in the 80's today.  Continue Lopressor 50 mg twice daily for rate control. - No reports of active bleeding. He remains on Eliquis 5 mg twice daily for anticoagulation which is the appropriate dose given his age, weight and renal function. Recent CBC in 09/2021 showed his hemoglobin was normal at 14.7 with platelets at 249 K.  3. Carotid Artery Stenosis - Dopplers in 03/2021 showed 50 to 69% RICA stenosis and 1 to 10% LICA stenosis. Would anticipate repeat imaging next year. Continue Atorvastatin '20mg'$  daily. No ASA given the need for anticoagulation.   4. History of Chest Pain - Recent NST in 08/2021 showed no  evidence of infarction or current ischemia. Continue with risk factor modification.   5. HTN - His BP is well-controlled at 128/80 during today's visit. Continue current medical therapy with Amlodipine 5 mg daily and Lopressor 50 mg twice daily.   Medication Adjustments/Labs and Tests Ordered: Current medicines are reviewed at length with the patient today.  Concerns regarding medicines are outlined above.  Medication changes, Labs and Tests ordered today are listed in the Patient Instructions below. Patient Instructions  Medication Instructions:  Take Lasix 20 mg daily as needed for weight gain over 2 lbs in 24 hours, or, 5 lbs in 1 week   Take for 3 days in a row and then use as needed   Labwork: None today  Testing/Procedures: None today  Follow-Up: 6 months  Any Other Special Instructions Will Be Listed Below (If Applicable).  If you need a refill on your cardiac medications before your next appointment, please call your pharmacy.    Signed, Erma Heritage, PA-C  11/27/2021 4:51 PM    [facility] [address] [place], [place] [address] Phone: [phone] Fax: [phone]

## 2021-11-27 NOTE — Progress Notes (Incomplete)
*  PRELIMINARY RESULTS* Echocardiogram 2D Echocardiogram has been performed.  Elpidio Anis 11/27/2021, 9:19 AM

## 2021-12-11 ENCOUNTER — Encounter (HOSPITAL_COMMUNITY)
Admission: RE | Admit: 2021-12-11 | Discharge: 2021-12-11 | Disposition: A | Discharge status: Home / Self Care | Payer: Medicare HMO | Source: Ambulatory Visit | Attending: Internal Medicine | Admitting: Internal Medicine

## 2021-12-15 ENCOUNTER — Telehealth: Payer: Self-pay | Admitting: Adult Health

## 2021-12-15 MED ORDER — APIXABAN 5 MG PO TABS: 5.0000 mg | ORAL_TABLET | Freq: Two times a day (BID) | ORAL | 0 refills | Status: DC

## 2021-12-15 NOTE — Telephone Encounter (Signed)
Says apixaban (ELIQUIS) 5 MG TABS tablet is very expensive and they have to meet the deductible. Asking if you have any samples to help them until they can meet the financial requirement. Pls call to advise

## 2021-12-15 NOTE — Telephone Encounter (Signed)
Samples are available.  Patient is aware.

## 2021-12-16 ENCOUNTER — Other Ambulatory Visit: Payer: Self-pay | Admitting: *Deleted

## 2021-12-16 ENCOUNTER — Encounter: Payer: Self-pay | Admitting: *Deleted

## 2021-12-16 ENCOUNTER — Telehealth: Payer: Self-pay | Admitting: Adult Health

## 2021-12-16 ENCOUNTER — Ambulatory Visit (HOSPITAL_COMMUNITY)
Admission: RE | Admit: 2021-12-16 | Discharge: 2021-12-16 | Disposition: A | Discharge status: Home / Self Care | Payer: Medicare HMO | Source: Ambulatory Visit | Attending: Pulmonary Disease | Admitting: Pulmonary Disease

## 2021-12-16 ENCOUNTER — Telehealth: Payer: Self-pay | Admitting: *Deleted

## 2021-12-16 DIAGNOSIS — J432 Centrilobular emphysema: Secondary | ICD-10-CM | POA: Diagnosis not present

## 2021-12-16 DIAGNOSIS — R0609 Other forms of dyspnea: Secondary | ICD-10-CM | POA: Diagnosis not present

## 2021-12-16 DIAGNOSIS — J929 Pleural plaque without asbestos: Secondary | ICD-10-CM | POA: Diagnosis not present

## 2021-12-16 DIAGNOSIS — I700 Atherosclerosis of aorta: Secondary | ICD-10-CM | POA: Diagnosis not present

## 2021-12-16 DIAGNOSIS — K449 Diaphragmatic hernia without obstruction or gangrene: Secondary | ICD-10-CM | POA: Diagnosis not present

## 2021-12-16 MED ORDER — NA SULFATE-K SULFATE-MG SULF 17.5-3.13-1.6 GM/177ML PO SOLN: ORAL | 0 refills | Status: DC

## 2021-12-16 NOTE — Telephone Encounter (Signed)
Please advise 

## 2021-12-16 NOTE — Telephone Encounter (Signed)
Pt came by office stating that his procedure is tomorrow and has not gotten his prep or instructions. Pt decided to reschedule his procedure from 12/17/21 until 01/21/22 at 8:15 am. He wanted to go with a different prep because he couldn't tolerate the "big jug". Prep changed and new instructions were given to pt.

## 2021-12-16 NOTE — Telephone Encounter (Signed)
Dr. at [facility] says Pt is out of age range.  Dr suggests Pt see Dr. Allyne Gee, who does see geriatric patients in [place], [place].  They did not have MD's contact info on hand.

## 2021-12-17 ENCOUNTER — Other Ambulatory Visit: Payer: Self-pay | Admitting: Adult Health

## 2021-12-17 NOTE — Telephone Encounter (Signed)
Tried to call pt to see which option he wanted to choose but no answer. Will try again.

## 2021-12-17 NOTE — Telephone Encounter (Signed)
Ok to place referral at office below?

## 2021-12-24 NOTE — Telephone Encounter (Signed)
Spoke to pt and advised of message below. Pt stated that he would like the referral placed in [place].

## 2021-12-25 ENCOUNTER — Other Ambulatory Visit: Payer: Self-pay | Admitting: Adult Health

## 2021-12-25 DIAGNOSIS — F418 Other specified anxiety disorders: Secondary | ICD-10-CM

## 2021-12-25 NOTE — Telephone Encounter (Signed)
Patient notified of update  and verbalized understanding. 

## 2021-12-30 ENCOUNTER — Ambulatory Visit (INDEPENDENT_AMBULATORY_CARE_PROVIDER_SITE_OTHER): Payer: Medicare HMO | Admitting: Pulmonary Disease

## 2021-12-30 ENCOUNTER — Ambulatory Visit: Payer: Medicare HMO | Admitting: Primary Care

## 2021-12-30 ENCOUNTER — Encounter: Payer: Self-pay | Admitting: Primary Care

## 2021-12-30 VITALS — BP 118/68 | HR 113 | Temp 98.7°F | Ht 68.0 in | Wt 220.0 lb

## 2021-12-30 DIAGNOSIS — J449 Chronic obstructive pulmonary disease, unspecified: Secondary | ICD-10-CM | POA: Diagnosis not present

## 2021-12-30 DIAGNOSIS — R0609 Other forms of dyspnea: Secondary | ICD-10-CM

## 2021-12-30 LAB — PULMONARY FUNCTION TEST
DL/VA % pred: 79 %
DL/VA: 3.23 ml/min/mmHg/L
DLCO cor % pred: 64 %
DLCO cor: 15.66 ml/min/mmHg
DLCO unc % pred: 64 %
DLCO unc: 15.66 ml/min/mmHg
FEF 25-75 Post: 1.71 L/sec
FEF 25-75 Pre: 1.3 L/sec
FEF2575-%Change-Post: 31 %
FEF2575-%Pred-Post: 76 %
FEF2575-%Pred-Pre: 57 %
FEV1-%Change-Post: 5 %
FEV1-%Pred-Post: 77 %
FEV1-%Pred-Pre: 73 %
FEV1-Post: 2.3 L
FEV1-Pre: 2.17 L
FEV1FVC-%Change-Post: 4 %
FEV1FVC-%Pred-Pre: 95 %
FEV6-%Change-Post: 2 %
FEV6-%Pred-Post: 81 %
FEV6-%Pred-Pre: 79 %
FEV6-Post: 3.13 L
FEV6-Pre: 3.04 L
FEV6FVC-%Change-Post: 1 %
FEV6FVC-%Pred-Post: 105 %
FEV6FVC-%Pred-Pre: 104 %
FVC-%Change-Post: 1 %
FVC-%Pred-Post: 77 %
FVC-%Pred-Pre: 76 %
FVC-Post: 3.14 L
FVC-Pre: 3.09 L
Post FEV1/FVC ratio: 73 %
Post FEV6/FVC ratio: 100 %
Pre FEV1/FVC ratio: 70 %
Pre FEV6/FVC Ratio: 98 %
RV % pred: 134 %
RV: 3.17 L
TLC % pred: 93 %
TLC: 6.2 L

## 2021-12-30 MED ORDER — TRELEGY ELLIPTA 100-62.5-25 MCG/ACT IN AEPB: 1.0000 | INHALATION_SPRAY | Freq: Every day | RESPIRATORY_TRACT | 5 refills | Status: DC

## 2021-12-30 NOTE — Assessment & Plan Note (Addendum)
-   Patient has symptoms of dyspnea with exertion, nocturnal wheezing and dry cough.  Pulmonary function testing consistent with mild COPD/emphysema.  Patient reports clinical benefit from trial of Trelegy 173mg, we will continue patient on this.  Advised to use incentive spirometer 10 breaths an hour 3 times a day.  He is up to date with influenza and pneumonia vaccination. [facility] 3 months [facility]

## 2021-12-30 NOTE — Patient Instructions (Addendum)
Pulmonary function testing consistent with mild COPD CT of your chest showed no evidence of pulmonary fibrosis, mild emphysema Echocardiogram showed normal heart function  Recommendations Continue Trelegy 155mg- take 1 puff in the morning every day (we do not have sample today, I have send in prescription- looks to be covered on your insurance, if not able to afford notify office)  Follow-up 3 months [facility]

## 2021-12-30 NOTE — Progress Notes (Signed)
$'[name]$  ID: Jay [name], male    DOB: Jun 08, 1950, 71 y.o.   MRN: [id number]  Chief Complaint  Patient presents with   Follow-up    PFT DOE Breathing has been ok     Referring provider: Dorothyann Peng, NP  HPI: 71 year old male, former smoker. PMH significant for HTN, COPD, depression/anxiety. Patient of Dr. Elsworth Soho, seen for initial consult for dyspnea on 11/11/21.  12/30/2021 Patient presents today for follow-up for dyspnea with PFTs testing. Accompanied by his wife today. He reports having a hard time breathing sometimes. He runs out of breath easily with exertion or when bending over. Associated wheezing at night and dry cough. He used Trelegy sample and did notice some improvement in symptoms with use. He had a sleep study in the past that was negative, he snores some but has not overt concerns about sleep apnea.   Pulmonary function testing: 12/30/2021 >> FVC 3.14 (77%), FEV1 2.30 (77%), ratio 73, TLC 93%, DLCOunc 15.66 (64%)  Imaging: 12/17/21 HRCT >> No evidence of fibrotic interstitial lung disease. Bland appearing, bandlike scarring of the bilateral lung bases, right-greater-than-left. Emphysema and diffuse bilateral bronchial wall thickening.  Cardiac testing: 11/27/21 Echocardiogram >> EF 55-60%, unable to evaluate diastolic function   Allergies  Allergen Reactions   Wellbutrin [Bupropion]     Hyper, nervous and couldn't sleep    Immunization History  Administered Date(s) Administered   Influenza-Unspecified 09/11/2021   PFIZER(Purple Top)SARS-COV-2 Vaccination 08/31/2019, 09/21/2019   Pneumococcal-Unspecified 09/11/2021    Past Medical History:  Diagnosis Date   Anxiety    Arthritis    COPD (chronic obstructive pulmonary disease) (HCC)    Depression    GERD (gastroesophageal reflux disease)    Hypertension    PAF (paroxysmal atrial fibrillation) (HCC)    Shortness of breath    with excertion    Tobacco History: Social History   Tobacco Use   Smoking Status Former   Types: Cigarettes   Quit date: 01/12/1993   Years since quitting: 28.9  Smokeless Tobacco Never   Counseling given: Not Answered   Outpatient Medications Prior to Visit  Medication Sig Dispense Refill   acetaminophen (TYLENOL) 500 MG tablet Take 1,000 mg by mouth every 6 (six) hours as needed for moderate pain.     albuterol (VENTOLIN HFA) 108 (90 Base) MCG/ACT inhaler Inhale 2 puffs into the lungs every 6 (six) hours as needed for wheezing or shortness of breath.     amLODipine (NORVASC) 5 MG tablet Take 5 mg by mouth every morning.     apixaban (ELIQUIS) 5 MG TABS tablet Take 1 tablet (5 mg total) by mouth 2 (two) times daily. 28 tablet 0   atorvastatin (LIPITOR) 20 MG tablet Take 20 mg by mouth daily.     cetirizine (ZYRTEC) 10 MG tablet Take 10 mg by mouth daily.     donepezil (ARICEPT) 10 MG tablet Take 10 mg by mouth at bedtime.     DULoxetine (CYMBALTA) 60 MG capsule Take 60 mg by mouth daily.     esomeprazole (NEXIUM) 40 MG capsule Take 40 mg by mouth daily.     famotidine (PEPCID) 20 MG tablet Take 20 mg by mouth at bedtime.     furosemide (LASIX) 20 MG tablet Take 20 mg daily as needed for weight gain over 2 lbs in 24 hours, or, 5 lbs in 1 week 90 tablet 3   lamoTRIgine (LAMICTAL) 200 MG tablet Take 200 mg by mouth daily.  metoprolol tartrate (LOPRESSOR) 25 MG tablet Take 25 mg by mouth 2 (two) times daily.     Na Sulfate-K Sulfate-Mg Sulf 17.5-3.13-1.6 GM/177ML SOLN As directed 354 mL 0   Fluticasone-Umeclidin-Vilant (TRELEGY ELLIPTA) 100-62.5-25 MCG/ACT AEPB Inhale 1 puff into the lungs daily. 60 each 0   metoprolol tartrate (LOPRESSOR) 50 MG tablet Take 1 tablet (50 mg total) by mouth 2 (two) times daily. (Patient not taking: Reported on 12/10/2021) 60 tablet 1   No facility-administered medications prior to visit.    Review of Systems  Review of Systems  Constitutional: Negative.   Respiratory:  Positive for shortness of breath.    Cardiovascular: Negative.    Physical Exam  BP 118/68 (BP Location: Left Arm, Patient Position: Sitting, Cuff Size: Large)   Pulse (!) 113   Temp 98.7 F (37.1 C) (Oral)   Ht '5\' 8"'$  (1.727 m)   Wt 220 lb (99.8 kg)   SpO2 97%   BMI 33.45 kg/m  Physical Exam Constitutional:      Appearance: Normal appearance.  HENT:     Head: Normocephalic and atraumatic.     Mouth/Throat:     Mouth: Mucous membranes are moist.     Pharynx: Oropharynx is clear.  Cardiovascular:     Rate and Rhythm: Normal rate and regular rhythm.  Pulmonary:     Effort: Pulmonary effort is normal.     Breath sounds: Normal breath sounds. No wheezing, rhonchi or rales.  Musculoskeletal:        General: Normal range of motion.     Cervical back: Normal range of motion and neck supple.  Skin:    General: Skin is warm and dry.  Neurological:     General: No focal deficit present.     Mental Status: He is alert and oriented to person, place, and time. Mental status is at baseline.  Psychiatric:        Mood and Affect: Mood normal.        Behavior: Behavior normal.        Thought Content: Thought content normal.        Judgment: Judgment normal.      Lab Results:  CBC    Component Value Date/Time   WBC 9.5 10/03/2021 1301   RBC 4.99 10/03/2021 1301   HGB 14.7 10/03/2021 1301   HCT 45.8 10/03/2021 1301   PLT 249 10/03/2021 1301   MCV 91.8 10/03/2021 1301   MCH 29.5 10/03/2021 1301   MCHC 32.1 10/03/2021 1301   RDW 14.0 10/03/2021 1301    BMET    Component Value Date/Time   NA 143 10/03/2021 1301   K 4.2 10/03/2021 1301   CL 107 10/03/2021 1301   CO2 24 10/03/2021 1301   GLUCOSE 103 (H) 10/03/2021 1301   BUN 18 10/03/2021 1301   CREATININE 1.20 10/03/2021 1301   CALCIUM 9.8 10/03/2021 1301   GFRNONAA >60 10/03/2021 1301    BNP    Component Value Date/Time   BNP 175.0 (H) 10/03/2021 1329    ProBNP No results found for: "PROBNP"  Imaging: CT Chest High Resolution  Result Date:  12/17/2021 CLINICAL DATA:  Shortness of breath, dyspnea on exertion for years EXAM: CT CHEST WITHOUT CONTRAST TECHNIQUE: Multidetector CT imaging of the chest was performed following the standard protocol without intravenous contrast. High resolution imaging of the lungs, as well as inspiratory and expiratory imaging, was performed. RADIATION DOSE REDUCTION: This exam was performed according to the departmental dose-optimization program which includes automated  exposure control, adjustment of the mA and/or kV according to patient size and/or use of iterative reconstruction technique. COMPARISON:  None Available. FINDINGS: Cardiovascular: Aortic atherosclerosis. Normal heart size. Three-vessel coronary artery calcifications. No pericardial effusion. Mediastinum/Nodes: No enlarged mediastinal, hilar, or axillary lymph nodes. Small hiatal hernia. Thyroid gland, trachea, and esophagus demonstrate no significant findings. Lungs/Pleura: Mild centrilobular and paraseptal emphysema. Diffuse bilateral bronchial wall thickening. Bland appearing, bandlike scarring of the bilateral lung bases, right-greater-than-left. No evidence of fibrotic interstitial lung disease. No significant air trapping on expiratory phase imaging. No pleural effusion or pneumothorax. Upper Abdomen: No acute abnormality. Musculoskeletal: No chest wall abnormality. No acute osseous findings. IMPRESSION: 1. No evidence of fibrotic interstitial lung disease. 2. Criss Rosales appearing, bandlike scarring of the bilateral lung bases, right-greater-than-left. 3. Emphysema and diffuse bilateral bronchial wall thickening. 4. Coronary artery disease. Aortic Atherosclerosis (ICD10-I70.0) and Emphysema (ICD10-J43.9). Electronically Signed   By: Delanna Ahmadi M.D.   On: 12/17/2021 09:25     Assessment & Plan:   COPD (chronic obstructive pulmonary disease) ([place]) - Patient has symptoms of dyspnea with exertion, nocturnal wheezing and dry cough.  Pulmonary function  testing consistent with mild COPD/emphysema.  Patient reports clinical benefit from trial of Trelegy 143mg, we will continue patient on this.  Advised to use incentive spirometer 10 breaths an hour 3 times a day.  He is up to date with influenza and pneumonia vaccination. [facility] 3 [facility]   EMartyn Ehrich NP 12/30/2021

## 2021-12-30 NOTE — Progress Notes (Signed)
PFT done today. 

## 2022-01-07 ENCOUNTER — Other Ambulatory Visit: Payer: Self-pay | Admitting: Adult Health

## 2022-01-15 ENCOUNTER — Telehealth (INDEPENDENT_AMBULATORY_CARE_PROVIDER_SITE_OTHER): Payer: Self-pay | Admitting: *Deleted

## 2022-01-15 ENCOUNTER — Telehealth: Payer: Self-pay | Admitting: *Deleted

## 2022-01-15 NOTE — Telephone Encounter (Signed)
Pt's spouse called and left vm needing to cancel his procedure for 01/21/22.  [place]

## 2022-01-15 NOTE — Telephone Encounter (Signed)
Called pt, LMOVM to call back to reschedule TCS with Dr. Gala Romney, ASA 3

## 2022-01-15 NOTE — Telephone Encounter (Signed)
-----   Message from Josue Hector sent at 01/15/2022  9:46 AM EST ----- This pt said to take him off the schedule and he wants to reschedule.  I put him in the depot.  Thanks,

## 2022-01-16 ENCOUNTER — Telehealth: Payer: Self-pay | Admitting: Adult Health

## 2022-01-16 NOTE — Telephone Encounter (Signed)
Left message for patient to call back and schedule Medicare Annual Wellness Visit (AWV) either virtually or in office. Left  my Herbie Drape number [phone]   awvi 12/12/13 per [facility]  please schedule with Nurse Health Adviser   45 min for awv-i and in office appointments 30 min for awv-s  phone/virtual appointments

## 2022-01-19 ENCOUNTER — Encounter (HOSPITAL_COMMUNITY): Payer: Medicare HMO

## 2022-01-21 ENCOUNTER — Encounter (HOSPITAL_COMMUNITY): Admission: RE | Payer: Self-pay | Source: Home / Self Care

## 2022-01-21 ENCOUNTER — Ambulatory Visit (HOSPITAL_COMMUNITY): Admission: RE | Admit: 2022-01-21 | Payer: Medicare HMO | Source: Home / Self Care | Admitting: Internal Medicine

## 2022-01-21 SURGERY — COLONOSCOPY WITH PROPOFOL
Anesthesia: Monitor Anesthesia Care

## 2022-02-05 ENCOUNTER — Telehealth: Payer: Self-pay | Admitting: Adult Health

## 2022-02-05 NOTE — Telephone Encounter (Signed)
Spoke to pt and he stated the medication cost $125. However, this medication came from pulmonary per pt. I advised to reach out to his lung doctor and advised them of the message below. Pt verbalized understanding.

## 2022-02-05 NOTE — Telephone Encounter (Signed)
Pt seeking samples of apixaban (ELIQUIS) 5 MG TABS tablet (Expired) and Fluticasone-Umeclidin-Vilant (TRELEGY ELLIPTA) 100-62.5-25 MCG/ACT AEPB . Says insurance is not paying for it and they are on a fixed income.please call to advise

## 2022-02-05 NOTE — Telephone Encounter (Signed)
Left message to return phone call.

## 2022-02-06 ENCOUNTER — Other Ambulatory Visit: Payer: Self-pay

## 2022-02-06 ENCOUNTER — Telehealth: Payer: Self-pay | Admitting: *Deleted

## 2022-02-06 ENCOUNTER — Telehealth: Payer: Self-pay | Admitting: Adult Health

## 2022-02-06 MED ORDER — APIXABAN 5 MG PO TABS: 5.0000 mg | ORAL_TABLET | Freq: Two times a day (BID) | ORAL | 0 refills | Status: DC

## 2022-02-06 MED ORDER — TRELEGY ELLIPTA 100-62.5-25 MCG/ACT IN AEPB: 1.0000 | INHALATION_SPRAY | Freq: Every day | RESPIRATORY_TRACT | 0 refills | Status: DC

## 2022-02-06 NOTE — Telephone Encounter (Signed)
Spoke w/ pt and told him that I would place 2 samples of Trelegy 100 at the desk. He verbalized understanding. NFN att

## 2022-02-06 NOTE — Telephone Encounter (Signed)
Per front office staff pt spouse returned call but I was unavailable. Call pt and spouse on both numbers but no answer. Will call back later.

## 2022-02-06 NOTE — Telephone Encounter (Signed)
Tried to call pt spouse back but no answer. According to front office staff pt spouse stated that pt has dementia. Spouse stated also advised the front the pt do receives Eliquis from [facility]. I found Eliquis '5mg'$  for pt. Tried to call spouse again but no answer. Will leave samples up front in cabinet for pick. However, pt or spouse needs to return call to see what insurance is covering in place of Eliquis so that we can send it in.

## 2022-02-06 NOTE — Telephone Encounter (Addendum)
Pt wife is calling and pt would like samples of eliquis

## 2022-02-06 NOTE — Telephone Encounter (Signed)
FYI Spoke to pt spouse and she stated that she did not want the pt to come off of his medication. Spouse stated that she will pay out of pocket and hope insurance will change. I advise that she may can bring pt in for an appt. To speak to [facility] about it but she declined.  it may not and he may need a replacement but she declined a visit. Eliquis sent to pharmacy.

## 2022-02-06 NOTE — Telephone Encounter (Signed)
Patient states she received a call from here. No notes show a call from here.

## 2022-02-23 ENCOUNTER — Encounter: Payer: Self-pay | Admitting: *Deleted

## 2022-02-24 ENCOUNTER — Encounter: Payer: Self-pay | Admitting: *Deleted

## 2022-03-12 ENCOUNTER — Encounter: Payer: Self-pay | Admitting: Radiology

## 2022-03-18 ENCOUNTER — Ambulatory Visit (HOSPITAL_COMMUNITY): Payer: Medicare HMO | Admitting: Psychiatry

## 2022-03-19 NOTE — Patient Instructions (Signed)
[place]  03/19/2022     '@PREFPERIOPPHARMACY'$ @   Your procedure is scheduled on  03/25/2022.   Report to [facility] at  0700  A.M.   Call this number if you have problems the morning of surgery:  [phone]  If you experience any cold or flu symptoms such as cough, fever, chills, shortness of breath, etc. between now and your scheduled surgery, please notify us at the above number.   Remember:  Follow the diet and prep instructions given to you by the office.     Your last dose of eliquis should be on 03/22/2022.    Use your inhaler before you come and bring your rescue inhaler with you.     Take these medicines the morning of surgery with A SIP OF WATER           amlodipine, zyrtec, cymbalata, metoprolol.     Do not wear jewelry, make-up or nail polish.  Do not wear lotions, powders, or perfumes, or deodorant.  Do not shave 48 hours prior to surgery.  Men may shave face and neck.  Do not bring valuables to the hospital.  [facility] is not responsible for any belongings or valuables.  Contacts, dentures or bridgework may not be worn into surgery.  Leave your suitcase in the car.  After surgery it may be brought to your room.  For patients admitted to the hospital, discharge time will be determined by your treatment team.  Patients discharged the day of surgery will not be allowed to drive home and must have someone with them for 24 hours.    Special instructions:   DO NOT smoke tobacco or vape for 24 hours before your procedure.  Please read over the following fact sheets that you were given. Anesthesia Post-op Instructions and Care and Recovery After Surgery      Colonoscopy, Adult, Care After The following information offers guidance on how to care for yourself after your procedure. Your health care provider may also give you more specific instructions. If you have problems or questions, contact your health care provider. What can I expect after  the procedure? After the procedure, it is common to have: A small amount of blood in your stool for 24 hours after the procedure. Some gas. Mild cramping or bloating of your abdomen. Follow these instructions at home: Eating and drinking  Drink enough fluid to keep your urine pale yellow. Follow instructions from your health care provider about eating or drinking restrictions. Resume your normal diet as told by your health care provider. Avoid heavy or fried foods that are hard to digest. Activity Rest as told by your health care provider. Avoid sitting for a long time without moving. Get up to take short walks every 1-2 hours. This is important to improve blood flow and breathing. Ask for help if you feel weak or unsteady. Return to your normal activities as told by your health care provider. Ask your health care provider what activities are safe for you. Managing cramping and bloating  Try walking around when you have cramps or feel bloated. If directed, apply heat to your abdomen as told by your health care provider. Use the heat source that your health care provider recommends, such as a moist heat pack or a heating pad. Place a towel between your skin and the heat source. Leave the heat on for 20-30 minutes. Remove the heat if your skin turns bright red. This is  especially important if you are unable to feel pain, heat, or cold. You have a greater risk of getting burned. General instructions If you were given a sedative during the procedure, it can affect you for several hours. Do not drive or operate machinery until your health care provider says that it is safe. For the first 24 hours after the procedure: Do not sign important documents. Do not drink alcohol. Do your regular daily activities at a slower pace than normal. Eat soft foods that are easy to digest. Take over-the-counter and prescription medicines only as told by your health care provider. Keep all follow-up visits.  This is important. Contact a health care provider if: You have blood in your stool 2-3 days after the procedure. Get help right away if: You have more than a small spotting of blood in your stool. You have large blood clots in your stool. You have swelling of your abdomen. You have nausea or vomiting. You have a fever. You have increasing pain in your abdomen that is not relieved with medicine. These symptoms may be an emergency. Get help right away. Call 911. Do not wait to see if the symptoms will go away. Do not drive yourself to the hospital. Summary After the procedure, it is common to have a small amount of blood in your stool. You may also have mild cramping and bloating of your abdomen. If you were given a sedative during the procedure, it can affect you for several hours. Do not drive or operate machinery until your health care provider says that it is safe. Get help right away if you have a lot of blood in your stool, nausea or vomiting, a fever, or increased pain in your abdomen. This information is not intended to replace advice given to you by your health care provider. Make sure you discuss any questions you have with your health care provider. Document Revised: 08/21/2020 Document Reviewed: 08/21/2020 Elsevier Patient Education  [place] After The following information offers guidance on how to care for yourself after your procedure. Your health care provider may also give you more specific instructions. If you have problems or questions, contact your health care provider. What can I expect after the procedure? After the procedure, it is common to have: Tiredness. Little or no memory about what happened during or after the procedure. Impaired judgment when it comes to making decisions. Nausea or vomiting. Some trouble with balance. Follow these instructions at home: For the time period you were told by your health care  provider:  Rest. Do not participate in activities where you could fall or become injured. Do not drive or use machinery. Do not drink alcohol. Do not take sleeping pills or medicines that cause drowsiness. Do not make important decisions or sign legal documents. Do not take care of children on your own. Medicines Take over-the-counter and prescription medicines only as told by your health care provider. If you were prescribed antibiotics, take them as told by your health care provider. Do not stop using the antibiotic even if you start to feel better. Eating and drinking Follow instructions from your health care provider about what you may eat and drink. Drink enough fluid to keep your urine pale yellow. If you vomit: Drink clear fluids slowly and in small amounts as you are able. Clear fluids include water, ice chips, low-calorie sports drinks, and fruit juice that has water added to it (diluted fruit juice). Eat light and bland foods  in small amounts as you are able. These foods include bananas, applesauce, rice, lean meats, toast, and crackers. General instructions  Have a responsible adult stay with you for the time you are told. It is important to have someone help care for you until you are awake and alert. If you have sleep apnea, surgery and some medicines can increase your risk for breathing problems. Follow instructions from your health care provider about wearing your sleep device: When you are sleeping. This includes during daytime naps. While taking prescription pain medicines, sleeping medicines, or medicines that make you drowsy. Do not use any products that contain nicotine or tobacco. These products include cigarettes, chewing tobacco, and vaping devices, such as e-cigarettes. If you need help quitting, ask your health care provider. Contact a health care provider if: You feel nauseous or vomit every time you eat or drink. You feel light-headed. You are still sleepy or  having trouble with balance after 24 hours. You get a rash. You have a fever. You have redness or swelling around the IV site. Get help right away if: You have trouble breathing. You have new confusion after you get home. These symptoms may be an emergency. Get help right away. Call 911. Do not wait to see if the symptoms will go away. Do not drive yourself to the hospital. This information is not intended to replace advice given to you by your health care provider. Make sure you discuss any questions you have with your health care provider. Document Revised: 05/26/2021 Document Reviewed: 05/26/2021 Elsevier Patient Education  [place].

## 2022-03-23 ENCOUNTER — Encounter (HOSPITAL_COMMUNITY)
Admission: RE | Admit: 2022-03-23 | Discharge: 2022-03-23 | Disposition: A | Discharge status: Home / Self Care | Payer: Medicare HMO | Source: Ambulatory Visit | Attending: Internal Medicine | Admitting: Internal Medicine

## 2022-03-23 ENCOUNTER — Encounter (HOSPITAL_COMMUNITY): Payer: Self-pay

## 2022-03-23 ENCOUNTER — Telehealth: Payer: Self-pay | Admitting: *Deleted

## 2022-03-23 VITALS — BP 120/87 | HR 97 | Temp 97.8°F | Resp 18 | Ht 68.0 in | Wt 220.0 lb

## 2022-03-23 DIAGNOSIS — Z79899 Other long term (current) drug therapy: Secondary | ICD-10-CM | POA: Diagnosis not present

## 2022-03-23 DIAGNOSIS — Z01812 Encounter for preprocedural laboratory examination: Secondary | ICD-10-CM | POA: Diagnosis not present

## 2022-03-23 HISTORY — DX: Cardiac arrhythmia, unspecified: I49.9

## 2022-03-23 LAB — BASIC METABOLIC PANEL
Anion gap: 9 (ref 5–15)
BUN: 18 mg/dL (ref 8–23)
CO2: 22 mmol/L (ref 22–32)
Calcium: 8.7 mg/dL — ABNORMAL LOW (ref 8.9–10.3)
Chloride: 104 mmol/L (ref 98–111)
Creatinine, Ser: 1.18 mg/dL (ref 0.61–1.24)
GFR, Estimated: >60 mL/min (ref >60)
Glucose, Bld: 110 mg/dL — ABNORMAL HIGH (ref 70–99)
Potassium: 4 mmol/L (ref 3.5–5.1)
Sodium: 135 mmol/L (ref 135–145)

## 2022-03-23 NOTE — Telephone Encounter (Signed)
[place] PA:  Approved Authorization [id number]  Tracking [id number] DOS: 03/25/2022 - 06/26/2022

## 2022-03-25 ENCOUNTER — Encounter (HOSPITAL_COMMUNITY)
Admission: RE | Disposition: A | Discharge status: Home / Self Care | Payer: Self-pay | Source: Home / Self Care | Attending: Internal Medicine

## 2022-03-25 ENCOUNTER — Ambulatory Visit (HOSPITAL_COMMUNITY): Payer: Medicare HMO | Admitting: Anesthesiology

## 2022-03-25 ENCOUNTER — Ambulatory Visit (HOSPITAL_COMMUNITY)
Admission: RE | Admit: 2022-03-25 | Discharge: 2022-03-25 | Disposition: A | Discharge status: Home / Self Care | Payer: Medicare HMO | Attending: Internal Medicine | Admitting: Internal Medicine

## 2022-03-25 ENCOUNTER — Encounter (HOSPITAL_COMMUNITY): Payer: Self-pay | Admitting: Internal Medicine

## 2022-03-25 ENCOUNTER — Ambulatory Visit (HOSPITAL_BASED_OUTPATIENT_CLINIC_OR_DEPARTMENT_OTHER): Payer: Medicare HMO | Admitting: Anesthesiology

## 2022-03-25 DIAGNOSIS — Z7901 Long term (current) use of anticoagulants: Secondary | ICD-10-CM | POA: Diagnosis not present

## 2022-03-25 DIAGNOSIS — F419 Anxiety disorder, unspecified: Secondary | ICD-10-CM | POA: Insufficient documentation

## 2022-03-25 DIAGNOSIS — F32A Depression, unspecified: Secondary | ICD-10-CM | POA: Diagnosis not present

## 2022-03-25 DIAGNOSIS — I10 Essential (primary) hypertension: Secondary | ICD-10-CM | POA: Diagnosis not present

## 2022-03-25 DIAGNOSIS — Z87891 Personal history of nicotine dependence: Secondary | ICD-10-CM

## 2022-03-25 DIAGNOSIS — Z1211 Encounter for screening for malignant neoplasm of colon: Secondary | ICD-10-CM | POA: Insufficient documentation

## 2022-03-25 DIAGNOSIS — Z79899 Other long term (current) drug therapy: Secondary | ICD-10-CM | POA: Insufficient documentation

## 2022-03-25 DIAGNOSIS — Z86010 Personal history of colonic polyps: Secondary | ICD-10-CM | POA: Diagnosis not present

## 2022-03-25 DIAGNOSIS — K5790 Diverticulosis of intestine, part unspecified, without perforation or abscess without bleeding: Secondary | ICD-10-CM | POA: Diagnosis not present

## 2022-03-25 DIAGNOSIS — K5730 Diverticulosis of large intestine without perforation or abscess without bleeding: Secondary | ICD-10-CM | POA: Insufficient documentation

## 2022-03-25 DIAGNOSIS — K635 Polyp of colon: Secondary | ICD-10-CM

## 2022-03-25 DIAGNOSIS — D120 Benign neoplasm of cecum: Secondary | ICD-10-CM | POA: Diagnosis not present

## 2022-03-25 DIAGNOSIS — J449 Chronic obstructive pulmonary disease, unspecified: Secondary | ICD-10-CM | POA: Insufficient documentation

## 2022-03-25 DIAGNOSIS — K219 Gastro-esophageal reflux disease without esophagitis: Secondary | ICD-10-CM | POA: Insufficient documentation

## 2022-03-25 HISTORY — PX: COLONOSCOPY WITH PROPOFOL: SHX5780

## 2022-03-25 HISTORY — PX: POLYPECTOMY: SHX149

## 2022-03-25 SURGERY — COLONOSCOPY WITH PROPOFOL
Anesthesia: General

## 2022-03-25 MED ORDER — PROPOFOL 500 MG/50ML IV EMUL
INTRAVENOUS | Status: DC | PRN
  Administered 2022-03-25: 100 ug/kg/min via INTRAVENOUS

## 2022-03-25 MED ORDER — LACTATED RINGERS IV SOLN: INTRAVENOUS | Status: DC

## 2022-03-25 MED ORDER — PROPOFOL 10 MG/ML IV BOLUS
INTRAVENOUS | Status: DC | PRN
  Administered 2022-03-25: 50 mg via INTRAVENOUS

## 2022-03-25 MED ORDER — METOPROLOL TARTRATE 5 MG/5ML IV SOLN
INTRAVENOUS | Status: AC
  Filled 2022-03-25: qty 5

## 2022-03-25 MED ORDER — METOPROLOL TARTRATE 5 MG/5ML IV SOLN
2.5000 mg | INTRAVENOUS | Status: AC | PRN
  Administered 2022-03-25 (×2): 2.5 mg via INTRAVENOUS

## 2022-03-25 NOTE — H&P (Signed)
$'@LOGO'E$ @   Primary Care Physician:  Dorothyann Peng, NP Primary Gastroenterologist:  Dr. Gala Romney  Pre-Procedure History & Physical: HPI:  Jay [name] is a 72 y.o. male here for a surveillance colonoscopy.  History of subcentimeter adenomas removed from his colon by [facility] in 2013; no subsequent follow-up.  No bowel symptoms at this time.  Patient on Eliquis chronically.  Past Medical History:  Diagnosis Date   Anxiety    Arthritis    COPD (chronic obstructive pulmonary disease) (HCC)    Depression    Dysrhythmia    GERD (gastroesophageal reflux disease)    Hypertension    PAF (paroxysmal atrial fibrillation) (HCC)    Shortness of breath    with excertion    Past Surgical History:  Procedure Laterality Date   APPENDECTOMY     EYE SURGERY      Prior to Admission medications   Medication Sig Start Date End Date Taking? Authorizing Provider  acetaminophen (TYLENOL) 500 MG tablet Take 1,000 mg by mouth every 6 (six) hours as needed for moderate pain.   Yes [provider]  albuterol (VENTOLIN HFA) 108 (90 Base) MCG/ACT inhaler Inhale 2 puffs into the lungs every 4 (four) hours as needed for wheezing or shortness of breath.   Yes [provider]  amLODipine (NORVASC) 5 MG tablet TAKE ONE TABLET BY MOUTH EVERY MORNING 01/07/22  Yes Nafziger, Tommi Rumps, NP  atorvastatin (LIPITOR) 20 MG tablet Take 20 mg by mouth at bedtime.   Yes [provider]  diphenhydrAMINE (BENADRYL) 25 MG tablet Take 25 mg by mouth daily.   Yes [provider]  donepezil (ARICEPT) 10 MG tablet TAKE ONE TABLET BY MOUTH EVERYDAY AT BEDTIME 01/07/22  Yes Nafziger, Tommi Rumps, NP  DULoxetine (CYMBALTA) 60 MG capsule Take 60 mg by mouth daily.   Yes [provider]  esomeprazole (NEXIUM) 40 MG capsule Take 40 mg by mouth daily.   Yes [provider]  famotidine (PEPCID) 20 MG tablet Take 20 mg by mouth at bedtime.   Yes [provider]  Fluticasone-Umeclidin-Vilant  (TRELEGY ELLIPTA) 100-62.5-25 MCG/ACT AEPB Inhale 1 puff into the lungs daily. 12/30/21  Yes Martyn Ehrich, NP  furosemide (LASIX) 20 MG tablet Take 20 mg daily as needed for weight gain over 2 lbs in 24 hours, or, 5 lbs in 1 week 11/27/21  Yes Strader, [place] M, PA-C  lamoTRIgine (LAMICTAL) 200 MG tablet TAKE ONE TABLET BY MOUTH EVERY EVENING 01/07/22  Yes Nafziger, Tommi Rumps, NP  metoprolol tartrate (LOPRESSOR) 50 MG tablet Take 1 tablet (50 mg total) by mouth 2 (two) times daily. Patient taking differently: Take 25 mg by mouth 2 (two) times daily. 10/03/21 03/19/22 Yes Mickie Hillier, PA-C  Na Sulfate-K Sulfate-Mg Sulf 17.5-3.13-1.6 GM/177ML SOLN As directed 12/16/21  Yes Arless Vineyard, Cristopher Estimable, MD  traMADol (ULTRAM) 50 MG tablet Take 50 mg by mouth every 6 (six) hours as needed for severe pain.   Yes [provider]  triamcinolone cream (KENALOG) 0.1 % Apply 1 Application topically daily as needed (rash).   Yes [provider]  apixaban (ELIQUIS) 5 MG TABS tablet Take 1 tablet (5 mg total) by mouth 2 (two) times daily. 02/06/22 04/07/22  Dorothyann Peng, NP    Allergies as of 02/23/2022 - Review Complete 12/30/2021  Allergen Reaction Noted   Wellbutrin [bupropion]  11/06/2021    Family History  Problem Relation Age of Onset   Hypertension Mother    Cancer Father    Hypertension Father  Hypertension Sister    Cancer Brother    Hypertension Brother    Cancer - Colon Neg Hx    Colon polyps Neg Hx     Social History   Socioeconomic History   Marital status: Married    Spouse name: Not on file   Number of children: Not on file   Years of education: Not on file   Highest education level: Not on file  Occupational History   Not on file  Tobacco Use   Smoking status: Former    Types: Cigarettes    Quit date: 01/12/1993    Years since quitting: 29.2   Smokeless tobacco: Never  Vaping Use   Vaping Use: Never used  Substance and Sexual Activity   Alcohol use: No     Comment: Quit 1995   Drug use: No   Sexual activity: Not on file  Other Topics Concern   Not on file  Social History Narrative   Not on file   Social Determinants of Health   Financial Resource Strain: Not on file  Food Insecurity: Not on file  Transportation Needs: Not on file  Physical Activity: Not on file  Stress: Not on file  Social Connections: Not on file  Intimate Partner Violence: Not on file    Review of Systems: See HPI, otherwise negative ROS  Physical Exam: BP (!) 143/102   Pulse (!) 136   Temp 97.6 F (36.4 C)   Resp 17   Ht '5\' 8"'$  (1.727 m)   Wt 99.8 kg   BMI 33.45 kg/m  General:   Alert,  Well-developed, well-nourished, pleasant and cooperative in NAD Neck:  Supple; no masses or thyromegaly. No significant cervical adenopathy. Lungs:  Clear throughout to auscultation.   No wheezes, crackles, or rhonchi. No acute distress. Heart:  Regular rate and rhythm; no murmurs, clicks, rubs,  or gallops. Abdomen: Non-distended, normal bowel sounds.  Soft and nontender without appreciable mass or hepatosplenomegaly.  Pulses:  Normal pulses noted. Extremities:  Without clubbing or edema.  Impression/Plan: 72 year old gentleman with a history of adenomas removed 11 years ago-here for surveillance colonoscopy. The risks, benefits, limitations, alternatives and imponderables have been reviewed with the patient. Questions have been answered. All parties are agreeable.   Last Eliquis 3 days ago.    Notice: This dictation was prepared with Dragon dictation along with smaller phrase technology. Any transcriptional errors that result from this process are unintentional and may not be corrected upon review.

## 2022-03-25 NOTE — Anesthesia Preprocedure Evaluation (Signed)
Anesthesia Evaluation  Patient identified by MRN, date of birth, ID band Patient awake    Reviewed: Allergy & Precautions, H&P , NPO status , Patient's Chart, lab work & pertinent test results, reviewed documented beta blocker date and time   Airway Mallampati: II  TM Distance: >3 FB Neck ROM: full    Dental no notable dental hx.    Pulmonary neg pulmonary ROS, shortness of breath, COPD, former smoker   Pulmonary exam normal breath sounds clear to auscultation       Cardiovascular Exercise Tolerance: Good hypertension, + DOE  negative cardio ROS + dysrhythmias Atrial Fibrillation  Rhythm:regular Rate:Normal     Neuro/Psych  PSYCHIATRIC DISORDERS Anxiety Depression    negative neurological ROS  negative psych ROS   GI/Hepatic negative GI ROS, Neg liver ROS,GERD  ,,  Endo/Other  negative endocrine ROS    Renal/GU negative Renal ROS  negative genitourinary   Musculoskeletal   Abdominal   Peds  Hematology negative hematology ROS (+)   Anesthesia Other Findings Pt has not taken Metoprolol for >24 hours.  Will give IV pre-op.  Reproductive/Obstetrics negative OB ROS                             Anesthesia Physical Anesthesia Plan  ASA: 3  Anesthesia Plan: General   Post-op Pain Management:    Induction:   PONV Risk Score and Plan: Propofol infusion  Airway Management Planned:   Additional Equipment:   Intra-op Plan:   Post-operative Plan:   Informed Consent: I have reviewed the patients History and Physical, chart, labs and discussed the procedure including the risks, benefits and alternatives for the proposed anesthesia with the patient or authorized representative who has indicated his/her understanding and acceptance.     Dental Advisory Given  Plan Discussed with: CRNA  Anesthesia Plan Comments:        Anesthesia Quick Evaluation

## 2022-03-25 NOTE — Transfer of Care (Signed)
Immediate Anesthesia Transfer of Care Note  Patient: [place]  Procedure(s) Performed: COLONOSCOPY WITH PROPOFOL POLYPECTOMY INTESTINAL  Patient Location: PACU  Anesthesia Type:General  Level of Consciousness: awake, alert , oriented and patient cooperative  Airway & Oxygen Therapy: Patient Spontanous Breathing  Post-op Assessment: Report given to RN and Post -op Vital signs reviewed and stable  Post vital signs: Reviewed and stable  Last Vitals:  Vitals Value Taken Time  BP 88/57 03/25/22 0929  Temp 36.7 C 03/25/22 0929  Pulse 111 03/25/22 0929  Resp 23 03/25/22 0929  SpO2 93 % 03/25/22 0929    Last Pain:  Vitals:   03/25/22 0929  TempSrc: Oral  PainSc: 0-No pain         Complications: No notable events documented.

## 2022-03-25 NOTE — OR Nursing (Signed)
Pt placed on monitor and had heart rate in the 135-140s range. Pt c/o epigastric pain 3/10 and being hot. Pt states "I haven't felt good the past 2-3 days".  Cool wash clothes given to pt. MD made aware of abnormal heart rate and pt's other symptoms.  Jules Baty [employer] RN-BSN

## 2022-03-25 NOTE — Op Note (Signed)
[facility] Patient Name: Jay [name] Procedure Date: 03/25/2022 8:48 AM MRN: [id number] Date of Birth: [birth year] Attending MD: Norvel Richards , MD, [id number] CSN: [id number] Age: 72 Admit Type: Outpatient Procedure:                Colonoscopy Indications:              High risk colon cancer surveillance: Personal                            history of colonic polyps Providers:                Norvel Richards, MD, [place] Page, [place]                            Risa Grill, Technician Referring MD:              Medicines:                Propofol per Anesthesia Complications:            No immediate complications. Estimated Blood Loss:     Estimated blood loss was minimal. Procedure:                Pre-Anesthesia Assessment:                           - Prior to the procedure, a History and Physical                            was performed, and patient medications and                            allergies were reviewed. The patient's tolerance of                            previous anesthesia was also reviewed. The risks                            and benefits of the procedure and the sedation                            options and risks were discussed with the patient.                            All questions were answered, and informed consent                            was obtained. Prior Anticoagulants: The patient has                            taken no anticoagulant or antiplatelet agents. ASA                            Grade Assessment: III - A patient with severe  systemic disease. After reviewing the risks and                            benefits, the patient was deemed in satisfactory                            condition to undergo the procedure.                           After obtaining informed consent, the colonoscope                            was passed under direct vision. Throughout the                            procedure,  the patient's blood pressure, pulse, and                            oxygen saturations were monitored continuously. The                            [id number]) scope was introduced through the                            anus and advanced to the the cecum, identified by                            appendiceal orifice and ileocecal valve. The                            colonoscopy was performed without difficulty. The                            patient tolerated the procedure well. The quality                            of the bowel preparation was adequate. The                            ileocecal valve, appendiceal orifice, and rectum                            were photographed. The entire colon was well                            visualized. Scope In: 9:10:50 AM Scope Out: 9:23:19 AM Scope Withdrawal Time: 0 hours 8 minutes 16 seconds  Total Procedure Duration: 0 hours 12 minutes 29 seconds  Findings:      The perianal and digital rectal examinations were normal.      A 3 mm polyp was found in the cecum. The polyp was sessile. The polyp       was removed with a cold snare. Resection and retrieval were complete.       Estimated blood loss was minimal.  The exam was otherwise without abnormality on direct and retroflexion       views.      Scattered diverticula were found in the entire colon. Impression:               - One 3 mm polyp in the cecum, removed with a cold                            snare. Resected and retrieved.                           - The examination was otherwise normal on direct                            and retroflexion views.                           - Diverticulosis in the entire examined colon. Moderate Sedation:      Moderate (conscious) sedation was personally administered by an       anesthesia professional. The following parameters were monitored: oxygen       saturation, heart rate, blood pressure, respiratory rate, EKG, adequacy       of  pulmonary ventilation, and response to care. Recommendation:           - Patient has a contact number available for                            emergencies. The signs and symptoms of potential                            delayed complications were discussed with the                            patient. Return to normal activities tomorrow.                            Written discharge instructions were provided to the                            patient.                           - Resume previous diet.                           - Repeat colonoscopy date to be determined after                            pending pathology results are reviewed for                            surveillance.                           - Return to GI office (date not yet determined).  Resume Eliquis today. Procedure Code(s):        --- Professional ---                           [id number], Colonoscopy, flexible; with removal of                            tumor(s), polyp(s), or other lesion(s) by snare                            technique Diagnosis Code(s):        --- Professional ---                           Z86.010, Personal history of colonic polyps                           D12.0, Benign neoplasm of cecum                           K57.30, Diverticulosis of large intestine without                            perforation or abscess without bleeding CPT copyright 2022 American Medical Association. All rights reserved. The codes documented in this report are preliminary and upon coder review may  be revised to meet current compliance requirements. Cristopher Estimable. Ivianna Notch, MD Norvel Richards, MD 03/25/2022 9:37:06 AM This report has been signed electronically. Number of Addenda: 0

## 2022-03-25 NOTE — Discharge Instructions (Signed)
  Colonoscopy Discharge Instructions  Read the instructions outlined below and refer to this sheet in the next few weeks. These discharge instructions provide you with general information on caring for yourself after you leave the hospital. Your doctor may also give you specific instructions. While your treatment has been planned according to the most current medical practices available, unavoidable complications occasionally occur. If you have any problems or questions after discharge, call Dr. Gala Romney at [phone]. ACTIVITY You may resume your regular activity, but move at a slower pace for the next 24 hours.  Take frequent rest periods for the next 24 hours.  Walking will help get rid of the air and reduce the bloated feeling in your belly (abdomen).  No driving for 24 hours (because of the medicine (anesthesia) used during the test).   Do not sign any important legal documents or operate any machinery for 24 hours (because of the anesthesia used during the test).  NUTRITION Drink plenty of fluids.  You may resume your normal diet as instructed by your doctor.  Begin with a light meal and progress to your normal diet. Heavy or fried foods are harder to digest and may make you feel sick to your stomach (nauseated).  Avoid alcoholic beverages for 24 hours or as instructed.  MEDICATIONS You may resume your normal medications unless your doctor tells you otherwise.  WHAT YOU CAN EXPECT TODAY Some feelings of bloating in the abdomen.  Passage of more gas than usual.  Spotting of blood in your stool or on the toilet paper.  IF YOU HAD POLYPS REMOVED DURING THE COLONOSCOPY: No aspirin products for 7 days or as instructed.  No alcohol for 7 days or as instructed.  Eat a soft diet for the next 24 hours.  FINDING OUT THE RESULTS OF YOUR TEST Not all test results are available during your visit. If your test results are not back during the visit, make an appointment with your caregiver to find out the  results. Do not assume everything is normal if you have not heard from your caregiver or the medical facility. It is important for you to follow up on all of your test results.  SEEK IMMEDIATE MEDICAL ATTENTION IF: You have more than a spotting of blood in your stool.  Your belly is swollen (abdominal distention).  You are nauseated or vomiting.  You have a temperature over 101.  You have abdominal pain or discomfort that is severe or gets worse throughout the day.       Diverticulosis information provided  1 small polyp removed from your colon  Further recommendations to follow pending review of pathology report  At patient request, I called Cevin Rubinstein at [phone].  Reviewed findings and recommendations  Resume Eliquis today

## 2022-03-26 NOTE — Anesthesia Postprocedure Evaluation (Signed)
Anesthesia Post Note  [name]  Procedure(s) Performed: COLONOSCOPY WITH PROPOFOL POLYPECTOMY INTESTINAL  Patient location during evaluation: Phase II Anesthesia Type: General Level of consciousness: awake Pain management: pain level controlled Vital Signs Assessment: post-procedure vital signs reviewed and stable Respiratory status: spontaneous breathing and respiratory function stable Cardiovascular status: blood pressure returned to baseline and stable Postop Assessment: no headache and no apparent nausea or vomiting Anesthetic complications: no Comments: Late entry   No notable events documented.   Last Vitals:  Vitals:   03/25/22 0734 03/25/22 0929  BP: (!) 143/102 (!) 88/57  Pulse: (!) 136 (!) 111  Resp: 17 (!) 23  Temp: 36.4 C 36.7 C  SpO2:  93%    Last Pain:  Vitals:   03/25/22 0929  TempSrc: Oral  PainSc: 0-No pain                 Louann Sjogren

## 2022-03-27 LAB — SURGICAL PATHOLOGY

## 2022-03-28 ENCOUNTER — Encounter: Payer: Self-pay | Admitting: Internal Medicine

## 2022-03-30 ENCOUNTER — Telehealth: Payer: Self-pay | Admitting: Adult Health

## 2022-03-30 NOTE — Telephone Encounter (Signed)
[place] to schedule their annual wellness visit. Appointment made for 04/01/22.  [place] [facility] direct phone # [phone]

## 2022-04-01 VITALS — Ht 68.0 in | Wt 200.0 lb

## 2022-04-01 DIAGNOSIS — Z0000 Encounter for general adult medical examination without abnormal findings: Secondary | ICD-10-CM

## 2022-04-01 NOTE — Addendum Note (Signed)
Addended by: Criselda Peaches on: 04/01/2022 03:00 PM   Modules accepted: Orders

## 2022-04-01 NOTE — Progress Notes (Signed)
Subjective:   Jay [name] is a 72 y.o. male who presents for Medicare Annual/Subsequent preventive examination.  Review of Systems    Virtual Visit via Telephone Note  I connected with  Jay [name] on 04/01/22 at  2:15 PM EDT by telephone and verified that I am speaking with the correct person using two identifiers.  Location: Patient: Home Provider: Office Persons participating in the virtual visit: patient/Nurse Health Advisor   I discussed the limitations, risks, security and privacy concerns of performing an evaluation and management service by telephone and the availability of in person appointments. The patient expressed understanding and agreed to proceed.  Interactive audio and video telecommunications were attempted between this nurse and patient, however failed, due to patient having technical difficulties OR patient did not have access to video capability.  We continued and completed visit with audio only.  Some vital signs may be absent or patient reported.   Criselda Peaches, LPN  Cardiac Risk Factors include: advanced age (>18men, >34 women)     Objective:    Today's Vitals   04/01/22 1418 04/01/22 1437  Weight: 200 lb (90.7 kg)   Height: 5\' 8"  (1.727 m)   PainSc:  0-No pain   Body mass index is 30.41 kg/m.     04/01/2022    2:50 PM 03/25/2022    7:32 AM 03/23/2022   10:14 AM 10/03/2021   12:10 PM 05/02/2021    5:12 PM 03/24/2021    8:00 AM 03/03/2021    5:28 PM  Advanced Directives  Does Patient Have a Medical Advance Directive? No No No No No No No  Would patient like information on creating a medical advance directive? No - Patient declined No - Patient declined No - Patient declined Yes (ED - Information included in AVS) Yes (ED - Information included in AVS) Yes (MAU/Ambulatory/Procedural Areas - Information given) No - Patient declined    Current Medications (verified) Outpatient Encounter Medications as of 04/01/2022  Medication Sig    acetaminophen (TYLENOL) 500 MG tablet Take 1,000 mg by mouth every 6 (six) hours as needed for moderate pain.   albuterol (VENTOLIN HFA) 108 (90 Base) MCG/ACT inhaler Inhale 2 puffs into the lungs every 4 (four) hours as needed for wheezing or shortness of breath.   amLODipine (NORVASC) 5 MG tablet TAKE ONE TABLET BY MOUTH EVERY MORNING   apixaban (ELIQUIS) 5 MG TABS tablet Take 1 tablet (5 mg total) by mouth 2 (two) times daily.   atorvastatin (LIPITOR) 20 MG tablet Take 20 mg by mouth at bedtime.   diphenhydrAMINE (BENADRYL) 25 MG tablet Take 25 mg by mouth daily.   donepezil (ARICEPT) 10 MG tablet TAKE ONE TABLET BY MOUTH EVERYDAY AT BEDTIME   DULoxetine (CYMBALTA) 60 MG capsule Take 60 mg by mouth daily.   esomeprazole (NEXIUM) 40 MG capsule Take 40 mg by mouth daily.   famotidine (PEPCID) 20 MG tablet Take 20 mg by mouth at bedtime.   Fluticasone-Umeclidin-Vilant (TRELEGY ELLIPTA) 100-62.5-25 MCG/ACT AEPB Inhale 1 puff into the lungs daily.   furosemide (LASIX) 20 MG tablet Take 20 mg daily as needed for weight gain over 2 lbs in 24 hours, or, 5 lbs in 1 week   lamoTRIgine (LAMICTAL) 200 MG tablet TAKE ONE TABLET BY MOUTH EVERY EVENING   metoprolol tartrate (LOPRESSOR) 50 MG tablet Take 1 tablet (50 mg total) by mouth 2 (two) times daily. (Patient taking differently: Take 25 mg by mouth 2 (two) times daily.)  Na Sulfate-K Sulfate-Mg Sulf 17.5-3.13-1.6 GM/177ML SOLN As directed   traMADol (ULTRAM) 50 MG tablet Take 50 mg by mouth every 6 (six) hours as needed for severe pain.   triamcinolone cream (KENALOG) 0.1 % Apply 1 Application topically daily as needed (rash).   No facility-administered encounter medications on file as of 04/01/2022.    Allergies (verified) Wellbutrin [bupropion]   History: Past Medical History:  Diagnosis Date   Anxiety    Arthritis    COPD (chronic obstructive pulmonary disease) (HCC)    Depression    Dysrhythmia    GERD (gastroesophageal reflux disease)     Hypertension    PAF (paroxysmal atrial fibrillation) (HCC)    Shortness of breath    with excertion   Past Surgical History:  Procedure Laterality Date   APPENDECTOMY     EYE SURGERY     Family History  Problem Relation Age of Onset   Hypertension Mother    Cancer Father    Hypertension Father    Hypertension Sister    Cancer Brother    Hypertension Brother    Cancer - Colon Neg Hx    Colon polyps Neg Hx    Social History   Socioeconomic History   Marital status: Married    Spouse name: Not on file   Number of children: Not on file   Years of education: Not on file   Highest education level: Not on file  Occupational History   Not on file  Tobacco Use   Smoking status: Former    Types: Cigarettes    Quit date: 01/12/1993    Years since quitting: 29.2   Smokeless tobacco: Never  Vaping Use   Vaping Use: Never used  Substance and Sexual Activity   Alcohol use: No    Comment: Quit 1995   Drug use: No   Sexual activity: Not on file  Other Topics Concern   Not on file  Social History Narrative   Not on file   Social Determinants of Health   Financial Resource Strain: Low Risk  (04/01/2022)   Overall Financial Resource Strain (CARDIA)    Difficulty of Paying Living Expenses: Not hard at all  Food Insecurity: No Food Insecurity (04/01/2022)   Hunger Vital Sign    Worried About Running Out of Food in the Last Year: Never true    Ran Out of Food in the Last Year: Never true  Transportation Needs: No Transportation Needs (04/01/2022)   PRAPARE - Hydrologist (Medical): No    Lack of Transportation (Non-Medical): No  Physical Activity: Inactive (04/01/2022)   Exercise Vital Sign    Days of Exercise per Week: 0 days    Minutes of Exercise per Session: 0 min  Stress: Stress Concern Present (04/01/2022)   [place]    Feeling of Stress : To some extent  Social Connections:  Socially Integrated (04/01/2022)   Social Connection and Isolation Panel [NHANES]    Frequency of Communication with Friends and Family: More than three times a week    Frequency of Social Gatherings with Friends and Family: More than three times a week    Attends Religious Services: More than 4 times per year    Active Member of [employer] or Organizations: Yes    Attends Music therapist: More than 4 times per year    Marital Status: Married    Tobacco Counseling Counseling given: Not Answered  Clinical Intake:  Pre-visit preparation completed: Yes  Pain : No/denies pain Pain Score: 0-No pain     BMI - recorded: 30.41 Nutritional Status: BMI > 30  Obese Nutritional Risks: None Diabetes: No  How often do you need to have someone help you when you read instructions, pamphlets, or other written materials from your doctor or pharmacy?: 1 - Never  Diabetic?  No  Interpreter Needed?: No  Information entered by :: Rolene Arbour LPN   Activities of Daily Living    04/01/2022    2:45 PM 03/23/2022   10:18 AM  In your present state of health, do you have any difficulty performing the following activities:  Hearing? 0   Vision? 0   Difficulty concentrating or making decisions? 1   Comment Wife assist   Walking or climbing stairs? 0   Dressing or bathing? 1   Comment Wife assist   Doing errands, shopping? 1 0  Comment Wife assist   Preparing Food and eating ? Y   Comment Wife assist   Using the Toilet? N   In the past six months, have you accidently leaked urine? N   Do you have problems with loss of bowel control? N   Managing your Medications? Y   Comment Wife assist   Managing your Finances? Y   Comment Wife assist   Housekeeping or managing your Housekeeping? Y   Comment Wife assist     Patient Care Team: Dorothyann Peng, NP as PCP - General (Family Medicine) Harl Bowie Alphonse Guild, MD as PCP - Cardiology (Cardiology)  Indicate any recent Medical  Services you may have received from other than [facility] providers in the past year (date may be approximate).     Assessment:   This is a routine wellness examination for [place].  Hearing/Vision screen Hearing Screening - Comments:: Denies hearing difficulties   Vision Screening - Comments:: Wears rx glasses - up to date with routine eye exams with  [facility]  Dietary issues and exercise activities discussed: Current Exercise Habits: The patient does not participate in regular exercise at present   Goals Addressed               This Visit's Progress     Patient stated (pt-stated)        I want to get healthy.       Depression Screen    04/01/2022    2:42 PM 11/04/2021    1:03 PM 09/23/2021    1:15 PM  PHQ 2/9 Scores  PHQ - 2 Score 2 6 6   PHQ- 9 Score 6 19 19     Fall Risk    04/01/2022    2:47 PM  Fall Risk   Falls in the past year? 1  Number falls in past yr: 0  Injury with Fall? 0  Comment No medical attention required  Risk for fall due to : Impaired balance/gait  Follow up Falls prevention discussed    FALL RISK PREVENTION PERTAINING TO THE HOME:  Any stairs in or around the home? Yes  If so, are there any without handrails? No  Home free of loose throw rugs in walkways, pet beds, electrical cords, etc? Yes  Adequate lighting in your home to reduce risk of falls? Yes   ASSISTIVE DEVICES UTILIZED TO PREVENT FALLS:  Life alert? No  Use of a cane, walker or w/c? No  Grab bars in the bathroom? Yes  Shower chair or bench in shower? Yes  Elevated toilet  seat or a handicapped toilet? No   TIMED UP AND GO:  Was the test performed? No . Audio Visit   Cognitive Function:        04/01/2022    2:50 PM  6CIT Screen  What Year? 0 points  What month? 0 points  What time? 0 points  Count back from 20 0 points  Months in reverse 0 points  Repeat phrase 0 points  Total Score 0 points    Immunizations Immunization History  Administered Date(s)  Administered   Influenza-Unspecified 09/11/2021   PFIZER(Purple Top)SARS-COV-2 Vaccination 08/31/2019, 09/21/2019   Pneumococcal-Unspecified 09/11/2021    TDAP status: Due, Education has been provided regarding the importance of this vaccine. Advised may receive this vaccine at local pharmacy or Health Dept. Aware to provide a copy of the vaccination record if obtained from local pharmacy or Health Dept. Verbalized acceptance and understanding.  Flu Vaccine status: Up to date  Pneumococcal vaccine status: Up to date  Covid-19 vaccine status: Completed vaccines  Qualifies for Shingles Vaccine? Yes   Zostavax completed No   Shingrix Completed?: No.    Education has been provided regarding the importance of this vaccine. Patient has been advised to call insurance company to determine out of pocket expense if they have not yet received this vaccine. Advised may also receive vaccine at local pharmacy or Health Dept. Verbalized acceptance and understanding.  Screening Tests Health Maintenance  Topic Date Due   DTaP/Tdap/Td (1 - Tdap) Never done   COVID-19 Vaccine (3 - Pfizer risk series) 04/17/2022 (Originally 10/19/2019)   Zoster Vaccines- Shingrix (1 of 2) 07/02/2022 (Originally 09/21/1969)   Pneumonia Vaccine 18+ Years old (1 of 1 - PCV) 04/01/2023 (Originally 09/22/2015)   Hepatitis C Screening  04/01/2023 (Originally 09/21/1968)   Medicare Annual Wellness (AWV)  04/01/2023   COLONOSCOPY (Pts 45-56yrs Insurance coverage will need to be confirmed)  03/24/2032   INFLUENZA VACCINE  Completed   HPV VACCINES  Aged Out    Health Maintenance  Health Maintenance Due  Topic Date Due   DTaP/Tdap/Td (1 - Tdap) Never done    Colorectal cancer screening: Type of screening: Colonoscopy. Completed 03/25/22. Repeat every 10 years  Lung Cancer Screening: (Low Dose CT Chest recommended if Age 48-80 years, 30 pack-year currently smoking OR have quit w/in 15years.) does not qualify.     Additional  Screening:  Hepatitis C Screening: does qualify; Deferred  Vision Screening: Recommended annual ophthalmology exams for early detection of glaucoma and other disorders of the eye. Is the patient up to date with their annual eye exam?  Yes  Who is the provider or what is the name of the office in which the patient attends annual eye exams? [facility] If pt is not established with a provider, would they like to be referred to a provider to establish care? No .   Dental Screening: Recommended annual dental exams for proper oral hygiene  Community Resource Referral / Chronic Care Management:  CRR required this visit?  No   CCM required this visit?  No      Plan:     I have personally reviewed and noted the following in the patient's chart:   Medical and social history Use of alcohol, tobacco or illicit drugs  Current medications and supplements including opioid prescriptions. Patient is not currently taking opioid prescriptions. Functional ability and status Nutritional status Physical activity Advanced directives List of other physicians Hospitalizations, surgeries, and ER visits in previous 12 months Vitals  Screenings to include cognitive, depression, and falls Referrals and appointments  In addition, I have reviewed and discussed with patient certain preventive protocols, quality metrics, and best practice recommendations. A written personalized care plan for preventive services as well as general preventive health recommendations were provided to patient.     Criselda Peaches, LPN   [id number]   Nurse Notes: Patient due Hep-C Screening

## 2022-04-01 NOTE — Progress Notes (Deleted)
? ?  Subjective:  ?This encounter was created in error - please disregard. ?

## 2022-04-01 NOTE — Patient Instructions (Addendum)
Jay [name] , Thank you for taking time to come for your Medicare Wellness Visit. I appreciate your ongoing commitment to your health goals. Please review the following plan we discussed and let me know if I can assist you in the future.   These are the goals we discussed:  Goals       Patient stated (pt-stated)      I want to get healthy.        This is a list of the screening recommended for you and due dates:  Health Maintenance  Topic Date Due   DTaP/Tdap/Td vaccine (1 - Tdap) Never done   COVID-19 Vaccine (3 - Pfizer risk series) 04/17/2022*   Zoster (Shingles) Vaccine (1 of 2) 07/02/2022*   Pneumonia Vaccine (1 of 1 - PCV) 04/01/2023*   Hepatitis C Screening: USPSTF Recommendation to screen - Ages 18-79 yo.  04/01/2023*   Medicare Annual Wellness Visit  04/01/2023   Colon Cancer Screening  03/24/2032   Flu Shot  Completed   HPV Vaccine  Aged Out  *Topic was postponed. The date shown is not the original due date.    Advanced directives: Advance directive discussed with you today. Even though you declined this today, please call our office should you change your mind, and we can give you the proper paperwork for you to fill out.   Conditions/risks identified: None  Next appointment: Follow up in one year for your annual wellness visit.    Preventive Care 38 Years and Older, Male  Preventive care refers to lifestyle choices and visits with your health care provider that can promote health and wellness. What does preventive care include? A yearly physical exam. This is also called an annual well check. Dental exams once or twice a year. Routine eye exams. Ask your health care provider how often you should have your eyes checked. Personal lifestyle choices, including: Daily care of your teeth and gums. Regular physical activity. Eating a healthy diet. Avoiding tobacco and drug use. Limiting alcohol use. Practicing safe sex. Taking low doses of aspirin every day. Taking  vitamin and mineral supplements as recommended by your health care provider. What happens during an annual well check? The services and screenings done by your health care provider during your annual well check will depend on your age, overall health, lifestyle risk factors, and family history of disease. Counseling  Your health care provider may ask you questions about your: Alcohol use. Tobacco use. Drug use. Emotional well-being. Home and relationship well-being. Sexual activity. Eating habits. History of falls. Memory and ability to understand (cognition). Work and work Statistician. Screening  You may have the following tests or measurements: Height, weight, and BMI. Blood pressure. Lipid and cholesterol levels. These may be checked every 5 years, or more frequently if you are over 62 years old. Skin check. Lung cancer screening. You may have this screening every year starting at age 102 if you have a 30-pack-year history of smoking and currently smoke or have quit within the past 15 years. Fecal occult blood test (FOBT) of the stool. You may have this test every year starting at age 66. Flexible sigmoidoscopy or colonoscopy. You may have a sigmoidoscopy every 5 years or a colonoscopy every 10 years starting at age 74. Prostate cancer screening. Recommendations will vary depending on your family history and other risks. Hepatitis C blood test. Hepatitis B blood test. Sexually transmitted disease (STD) testing. Diabetes screening. This is done by checking your blood sugar (glucose) after you  have not eaten for a while (fasting). You may have this done every 1-3 years. Abdominal aortic aneurysm (AAA) screening. You may need this if you are a current or former smoker. Osteoporosis. You may be screened starting at age 49 if you are at high risk. Talk with your health care provider about your test results, treatment options, and if necessary, the need for more tests. Vaccines  Your  health care provider may recommend certain vaccines, such as: Influenza vaccine. This is recommended every year. Tetanus, diphtheria, and acellular pertussis (Tdap, Td) vaccine. You may need a Td booster every 10 years. Zoster vaccine. You may need this after age 23. Pneumococcal 13-valent conjugate (PCV13) vaccine. One dose is recommended after age 1. Pneumococcal polysaccharide (PPSV23) vaccine. One dose is recommended after age 42. Talk to your health care provider about which screenings and vaccines you need and how often you need them. This information is not intended to replace advice given to you by your health care provider. Make sure you discuss any questions you have with your health care provider. Document Released: 01/25/2015 Document Revised: 09/18/2015 Document Reviewed: 10/30/2014 Elsevier Interactive Patient Education  2017 [place] Prevention in the Home Falls can cause injuries. They can happen to people of all ages. There are many things you can do to make your home safe and to help prevent falls. What can I do on the outside of my home? Regularly fix the edges of walkways and driveways and fix any cracks. Remove anything that might make you trip as you walk through a door, such as a raised step or threshold. Trim any bushes or trees on the path to your home. Use bright outdoor lighting. Clear any walking paths of anything that might make someone trip, such as rocks or tools. Regularly check to see if handrails are loose or broken. Make sure that both sides of any steps have handrails. Any raised decks and porches should have guardrails on the edges. Have any leaves, snow, or ice cleared regularly. Use sand or salt on walking paths during winter. Clean up any spills in your garage right away. This includes oil or grease spills. What can I do in the bathroom? Use night lights. Install grab bars by the toilet and in the tub and shower. Do not use towel bars as  grab bars. Use non-skid mats or decals in the tub or shower. If you need to sit down in the shower, use a plastic, non-slip stool. Keep the floor dry. Clean up any water that spills on the floor as soon as it happens. Remove soap buildup in the tub or shower regularly. Attach bath mats securely with double-sided non-slip rug tape. Do not have throw rugs and other things on the floor that can make you trip. What can I do in the bedroom? Use night lights. Make sure that you have a light by your bed that is easy to reach. Do not use any sheets or blankets that are too big for your bed. They should not hang down onto the floor. Have a firm chair that has side arms. You can use this for support while you get dressed. Do not have throw rugs and other things on the floor that can make you trip. What can I do in the kitchen? Clean up any spills right away. Avoid walking on wet floors. Keep items that you use a lot in easy-to-reach places. If you need to reach something above you, use a strong step  stool that has a grab bar. Keep electrical cords out of the way. Do not use floor polish or wax that makes floors slippery. If you must use wax, use non-skid floor wax. Do not have throw rugs and other things on the floor that can make you trip. What can I do with my stairs? Do not leave any items on the stairs. Make sure that there are handrails on both sides of the stairs and use them. Fix handrails that are broken or loose. Make sure that handrails are as long as the stairways. Check any carpeting to make sure that it is firmly attached to the stairs. Fix any carpet that is loose or worn. Avoid having throw rugs at the top or bottom of the stairs. If you do have throw rugs, attach them to the floor with carpet tape. Make sure that you have a light switch at the top of the stairs and the bottom of the stairs. If you do not have them, ask someone to add them for you. What else can I do to help prevent  falls? Wear shoes that: Do not have high heels. Have rubber bottoms. Are comfortable and fit you well. Are closed at the toe. Do not wear sandals. If you use a stepladder: Make sure that it is fully opened. Do not climb a closed stepladder. Make sure that both sides of the stepladder are locked into place. Ask someone to hold it for you, if possible. Clearly mark and make sure that you can see: Any grab bars or handrails. First and last steps. Where the edge of each step is. Use tools that help you move around (mobility aids) if they are needed. These include: Canes. Walkers. Scooters. Crutches. Turn on the lights when you go into a dark area. Replace any light bulbs as soon as they burn out. Set up your furniture so you have a clear path. Avoid moving your furniture around. If any of your floors are uneven, fix them. If there are any pets around you, be aware of where they are. Review your medicines with your doctor. Some medicines can make you feel dizzy. This can increase your chance of falling. Ask your doctor what other things that you can do to help prevent falls. This information is not intended to replace advice given to you by your health care provider. Make sure you discuss any questions you have with your health care provider. Document Released: 10/25/2008 Document Revised: 06/06/2015 Document Reviewed: 02/02/2014 Elsevier Interactive Patient Education  2017 [employer].

## 2022-04-02 ENCOUNTER — Other Ambulatory Visit: Payer: Self-pay | Admitting: Adult Health

## 2022-04-03 ENCOUNTER — Encounter (HOSPITAL_COMMUNITY): Payer: Self-pay | Admitting: Internal Medicine

## 2022-04-13 ENCOUNTER — Encounter (HOSPITAL_COMMUNITY): Payer: Self-pay | Admitting: Psychiatry

## 2022-04-13 ENCOUNTER — Ambulatory Visit (HOSPITAL_BASED_OUTPATIENT_CLINIC_OR_DEPARTMENT_OTHER): Payer: Medicare HMO | Admitting: Psychiatry

## 2022-04-13 VITALS — BP 110/87 | HR 99

## 2022-04-13 DIAGNOSIS — F331 Major depressive disorder, recurrent, moderate: Secondary | ICD-10-CM | POA: Diagnosis not present

## 2022-04-13 DIAGNOSIS — F411 Generalized anxiety disorder: Secondary | ICD-10-CM | POA: Insufficient documentation

## 2022-04-13 DIAGNOSIS — F329 Major depressive disorder, single episode, unspecified: Secondary | ICD-10-CM | POA: Insufficient documentation

## 2022-04-13 MED ORDER — DULOXETINE HCL 30 MG PO CPEP: ORAL_CAPSULE | ORAL | 0 refills | Status: DC

## 2022-04-13 MED ORDER — LURASIDONE HCL 20 MG PO TABS: 20.0000 mg | ORAL_TABLET | Freq: Every day | ORAL | 2 refills | Status: DC

## 2022-04-13 NOTE — Progress Notes (Signed)
Psychiatric Initial Adult Assessment   Patient Identification: Jay [name] MRN:  [id number] Date of Evaluation:  04/13/2022 Referral Source: PCP Chief Complaint:   Chief Complaint  Patient presents with   Establish Care   Visit Diagnosis:    ICD-10-CM   1. GAD (generalized anxiety disorder)  F41.1 lurasidone (LATUDA) 20 MG TABS tablet    DULoxetine (CYMBALTA) 30 MG capsule    2. Moderate episode of recurrent major depressive disorder  F33.1        Assessment:  [name] is a 72 y.o. male with a history of MDD, GAD, COPD, HTN,  who presents in person to [place] at [facility] for initial evaluation on 04/13/2022.    At initial evaluation patient reported neurovegetative symptoms of depression including low mood, anhedonia, amotivation, poor sleep, increased appetite, excessive tearfulness, fatigue, poor concentration, and intermittent passive SI.  He denies any passive SI at this time reporting that there is only been 2 episodes since 2022.  He denies any intent or plan since around 2014.  Safety planning and crisis resources were discussed. In addition to depression patient also reported significant anxiety.  He endorsed symptoms of constant worry that he is unable to control, difficulty relaxing, restlessness, increased irritability, and fear of something awful happening.  Patient did endorse having episodes of panic where he is short of breath, restless, diaphoretic, and can get muscle rigidity.  These were more frequent while on Wellbutrin though still occur occasionally since discontinuing.  Patient met criteria for MDD and GAD.  A number of assessments were performed during the evaluation today including  PHQ-9 which they scored a 22 on, GAD-7 which they scored a 20 on, and [place] suicide severity screening which showed low risk.  Based on these assessments patient would benefit from medication adjustment to better target their symptoms.  Plan: -  Increase Cymbalta to 60 mg QD and 30 mg at bedtime, after 2 weeks increase to 60 mg BID - Continue Lamictal 200 mg QHS - Start Latuda 20 mg w/dinner - Continue Donepezil 10 mg QD, managed by PCP - CMP and CBC reviewed - Therapy referral - Crisis resources reviewed - Follow up in a month  History of Present Illness: Jay [name] presents reporting he was referred by his primary care doctor due to suffering from real bad depression and anxiety.  Patient notes that he has struggled with this for over 15 years now but thinks the last few years have been getting worse.  Yoshihiro believes has gotten worse recently due to a number of factors including his wife being diagnosed with colon cancer for which she was given 3 to 5 years by her doctors, financial concerns, is declining physical health/inability to do things he had in the past, and the poor relationship with his son.  In regards to his son patient notes that he stopped talking to both [place], his wife, and his sister 2 years ago and they are unsure why.  The family believes it may have something to do with the daughter-in-law not getting along with the family.  This is upsetting for Jay [name] as family has always been important and he has not been able to see his son or grandkids and a couple years now.  Currently Jay [name] describes his depression as an extreme low mood with anhedonia, amotivation, poor sleep, increased appetite, excessive tearfulness, fatigue, poor concentration, and intermittent passive SI.  Patient reports only 2 episodes of passive SI in the past 2 years, and  notes that he has not had a plan or rehearsed any suicide attempts in over 10 years.  At that time patient had thoughts of crashing his car and had driven around looking for places.  He notes that he has decreased his driving significantly in the past year after an incident where he almost got into an accident which she denies being a suicide attempt.  Crisis resources and safety  planning was reviewed.  Of note patient also did endorse having a firearm and firearm safety was reviewed.  In addition to depression patient also reports significant anxiety which believes got worse after trying Wellbutrin.  He endorses symptoms of constant worry that he is unable to control, difficulty relaxing, restlessness, increased irritability, and fear of something awful happening.  While on Wellbutrin he did endorse having episodes of panic where he is short of breath, restless, diaphoretic, and can get muscle rigidity.  After stopping Wellbutrin these did decrease though can still happen on occasion.  Treatment options were discussed including medications, therapy, and behavioral activation.  In regards to therapy patient notes never having given a trial in the past but was open to being referred today.  As for medications he is unsure whether his current regimen has been as effective.  We discussed options going forward and suggested maximizing Cymbalta before trying an alternative antidepressant medication.  Also suggested starting Latuda as an adjunct for mood symptoms which patient was agreeable with.  Risk and benefits of both medications were discussed.  In addition to medications and therapy options we discussed the benefit of behavioral activation.  Patient notes that church has been important to him as well spending time with his daughter and grandkids.  He was encouraged to set attainable goals that he can work towards also being gentle to himself if he does not meet them day-to-day.  Associated Signs/Symptoms: Depression Symptoms:  depressed mood, anhedonia, insomnia, fatigue, difficulty concentrating, impaired memory, anxiety, loss of energy/fatigue, disturbed sleep, increased appetite, (Hypo) Manic Symptoms:  Irritable Mood, Anxiety Symptoms:  Excessive Worry, Panic Symptoms, Psychotic Symptoms:   denies PTSD Symptoms: Negative  Past Psychiatric History: Had seen a  psychiatrist 2009, no prior psychiatric hospitalizations, no prior suicide attempts.  He denies ever being connected with a therapist.  Has tried Wellbutrin, Xanax, Restoril in the past  Denies current substance. Alcohol and marijuana 20-30 years ago.  Previous Psychotropic Medications: Yes   Substance Abuse History in the last 12 months:  No.  Consequences of Substance Abuse: NA  Past Medical History:  Past Medical History:  Diagnosis Date   Anxiety    Arthritis    COPD (chronic obstructive pulmonary disease) (HCC)    Depression    Dysrhythmia    GERD (gastroesophageal reflux disease)    Hypertension    PAF (paroxysmal atrial fibrillation) (HCC)    Shortness of breath    with excertion    Past Surgical History:  Procedure Laterality Date   APPENDECTOMY     COLONOSCOPY WITH PROPOFOL N/A 03/25/2022   Procedure: COLONOSCOPY WITH PROPOFOL;  Surgeon: Daneil Dolin, MD;  [facility];  Service: Endoscopy;  Laterality: N/A;  9:00 am   EYE SURGERY     POLYPECTOMY  03/25/2022   Procedure: POLYPECTOMY INTESTINAL;  Surgeon: Daneil Dolin, MD;  Location: [facility];  Service: Endoscopy;;    Family Psychiatric History: Denies  Family History:  Family History  Problem Relation Age of Onset   Hypertension Mother    Cancer Father  Hypertension Father    Hypertension Sister    Cancer Brother    Hypertension Brother    Cancer - Colon Neg Hx    Colon polyps Neg Hx     Social History:   Social History   Socioeconomic History   Marital status: Married    Spouse name: Not on file   Number of children: Not on file   Years of education: Not on file   Highest education level: Not on file  Occupational History   Not on file  Tobacco Use   Smoking status: Former    Types: Cigarettes    Quit date: 01/12/1993    Years since quitting: 29.2   Smokeless tobacco: Never  Vaping Use   Vaping Use: Never used  Substance and Sexual Activity   Alcohol use: No     Comment: Quit 1995   Drug use: No   Sexual activity: Not on file  Other Topics Concern   Not on file  Social History Narrative   Not on file   Social Determinants of Health   Financial Resource Strain: Low Risk  (04/01/2022)   Overall Financial Resource Strain (CARDIA)    Difficulty of Paying Living Expenses: Not hard at all  Food Insecurity: No Food Insecurity (04/01/2022)   Hunger Vital Sign    Worried About Running Out of Food in the Last Year: Never true    [place] in the Last Year: Never true  Transportation Needs: No Transportation Needs (04/01/2022)   PRAPARE - Hydrologist (Medical): No    Lack of Transportation (Non-Medical): No  Physical Activity: Inactive (04/01/2022)   Exercise Vital Sign    Days of Exercise per Week: 0 days    Minutes of Exercise per Session: 0 min  Stress: Stress Concern Present (04/01/2022)   [place]    Feeling of Stress : To some extent  Social Connections: Socially Integrated (04/01/2022)   Social Connection and Isolation Panel [NHANES]    Frequency of Communication with Friends and Family: More than three times a week    Frequency of Social Gatherings with Friends and Family: More than three times a week    Attends Religious Services: More than 4 times per year    Active Member of [employer] or Organizations: Yes    Attends Music therapist: More than 4 times per year    Marital Status: Married    Additional Social History: Patient lives with his wife of 56 years. He has a son, daughter, and 7 grand kids. He is close with his daughter and her kids. He has not spoke to his son or seen his grand kids on that side in around 2 years now, and is unsure why. 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 At that time he went on disability secondary to his depression.    Allergies:   Allergies  Allergen Reactions   Wellbutrin [Bupropion]     Hyper, nervous and couldn't sleep    Metabolic Disorder Labs: No results found for: "HGBA1C", "MPG" No results found for: "PROLACTIN" No results found for: "CHOL", "TRIG", "HDL", "CHOLHDL", "VLDL", "LDLCALC" Lab Results  Component Value Date   TSH 1.821 05/02/2021    Therapeutic Level Labs: No results found for: "LITHIUM" No results found for: "CBMZ" No results found for: "VALPROATE"  Current Medications: Current Outpatient Medications  Medication Sig Dispense Refill   DULoxetine (CYMBALTA) 30 MG capsule Take 1 capsule (30 mg total) by mouth at bedtime for 14 days, THEN 2 capsules (60 mg total) at bedtime. Take 60 mg in the morning as previously prescribed. Upon getting this prescription start taking 30 mg pill at bedtime as well. After 2 weeks increase that to 60 mg at bedtime.. 75 capsule 0   lurasidone (LATUDA) 20 MG TABS tablet Take 1 tablet (20 mg total) by mouth daily with supper. 30 tablet 2   acetaminophen (TYLENOL) 500 MG tablet Take 1,000 mg by mouth every 6 (six) hours as needed for moderate pain.     albuterol (VENTOLIN HFA) 108 (90 Base) MCG/ACT inhaler Inhale 2 puffs into the lungs every 4 (four) hours as needed for wheezing or shortness of breath.     amLODipine (NORVASC) 5 MG tablet TAKE ONE TABLET BY MOUTH EVERY MORNING 90 tablet 1   atorvastatin (LIPITOR) 20 MG tablet Take 20 mg by mouth at bedtime.     diphenhydrAMINE (BENADRYL) 25 MG tablet Take 25 mg by mouth daily.     donepezil (ARICEPT) 10 MG tablet TAKE ONE TABLET BY MOUTH EVERYDAY AT BEDTIME 90 tablet 1   DULoxetine (CYMBALTA) 60 MG capsule Take 60 mg by mouth daily.     ELIQUIS 5 MG TABS tablet TAKE ONE TABLET BY MOUTH TWICE DAILY 180 tablet 0   esomeprazole (NEXIUM) 40 MG capsule Take 40 mg by mouth daily.     famotidine (PEPCID) 20 MG tablet Take 20 mg by mouth at bedtime.     Fluticasone-Umeclidin-Vilant (TRELEGY ELLIPTA)  100-62.5-25 MCG/ACT AEPB Inhale 1 puff into the lungs daily. 60 each 5   furosemide (LASIX) 20 MG tablet Take 20 mg daily as needed for weight gain over 2 lbs in 24 hours, or, 5 lbs in 1 week 90 tablet 3   lamoTRIgine (LAMICTAL) 200 MG tablet TAKE ONE TABLET BY MOUTH EVERY EVENING 90 tablet 1   metoprolol tartrate (LOPRESSOR) 50 MG tablet Take 1 tablet (50 mg total) by mouth 2 (two) times daily. (Patient taking differently: Take 25 mg by mouth 2 (two) times daily.) 60 tablet 1   Na Sulfate-K Sulfate-Mg Sulf 17.5-3.13-1.6 GM/177ML SOLN As directed 354 mL 0   traMADol (ULTRAM) 50 MG tablet Take 50 mg by mouth every 6 (six) hours as needed for severe pain.     triamcinolone cream (KENALOG) 0.1 % Apply 1 Application topically daily as needed (rash).     No current facility-administered medications for this visit.    Musculoskeletal: Strength & Muscle Tone: within normal limits Gait & Station: normal Patient leans: N/A  Psychiatric Specialty Exam: Review of Systems  Blood pressure 110/87, pulse 99.There is no height or weight on file to calculate BMI.  General Appearance: Well Groomed  Eye Contact:  Good  Speech:  Clear and Coherent and Normal Rate  Volume:  Normal  Mood:  Anxious and Depressed  Affect:  Congruent, Full Range, and Tearful  Thought Process:  Coherent and Goal Directed  Orientation:  Full (Time, Place, and Person)  Thought Content:  Logical  Suicidal Thoughts:  No  Homicidal Thoughts:  No  Memory:  Immediate;   Good Recent;   Fair Remote;   Fair  Judgement:  Fair  Insight:  Fair  Psychomotor Activity:  Restlessness  Concentration:  Concentration: Fair  Recall:  [place] of Knowledge:Fair  Language: Good  Akathisia:  NA  AIMS (if indicated):  not done  Assets:  Communication Skills Desire for Improvement Housing Intimacy Transportation  ADL's:  Intact  Cognition: WNL  Sleep:  [place]: [place] Office Visit from 04/13/2022 in  [facility] Erroneous Encounter from 04/01/2022 in [name] at [place] from 11/04/2021 in [place] [facility] [place] from 09/23/2021 in [place] [facility] at [place]  PHQ-2 Total Score 6 2 6 6   PHQ-9 Total Score 22 6 19 19       [place] Office Visit from 04/13/2022 in [facility] Admission (Discharged) from 03/25/2022 in [facility] from 03/23/2022 in [place] CATEGORY Low Risk No Risk No Risk        Collaboration of Care: Medication Management AEB medication prescription and Primary Care Provider AEB chart review  75 minutes were spent in chart review, interview, psycho education, counseling, medical decision making, coordination of care and long-term prognosis.  Patient was given opportunity to ask question and all concerns and questions were addressed and answers. Excluding separately billable services.   Patient/Guardian was advised Release of Information must be obtained prior to any record release in order to collaborate their care with an outside provider. Patient/Guardian was advised if they have not already done so to contact the registration department to sign all necessary forms in order for [place] to release information regarding their care.   Consent: Patient/Guardian gives verbal consent for treatment and assignment of benefits for services provided during this visit. Patient/Guardian expressed understanding and agreed to proceed.   Vista Mink, MD 4/1/20249:03 AM

## 2022-04-15 ENCOUNTER — Telehealth: Payer: Self-pay | Admitting: Adult Health

## 2022-04-15 NOTE — Telephone Encounter (Signed)
pt went to psychiatrist, who said needs to see a therapist. Asking for your referral to a therapist

## 2022-04-16 ENCOUNTER — Telehealth: Payer: Self-pay | Admitting: Adult Health

## 2022-04-16 ENCOUNTER — Other Ambulatory Visit: Payer: Self-pay | Admitting: Adult Health

## 2022-04-16 NOTE — Telephone Encounter (Signed)
error 

## 2022-04-16 NOTE — Telephone Encounter (Signed)
Please advise 

## 2022-04-16 NOTE — Telephone Encounter (Signed)
Patient notified of update  and verbalized understanding. 

## 2022-04-23 ENCOUNTER — Other Ambulatory Visit: Payer: Self-pay | Admitting: Cardiology

## 2022-04-23 NOTE — Addendum Note (Signed)
Addended by: Tillie Rung on: 04/23/2022 07:54 AM   Modules accepted: Orders, Level of Service

## 2022-04-23 NOTE — Progress Notes (Addendum)
This encounter was created in error - please disregard.

## 2022-05-06 DIAGNOSIS — F411 Generalized anxiety disorder: Secondary | ICD-10-CM | POA: Diagnosis not present

## 2022-05-06 DIAGNOSIS — F03A0 Unspecified dementia, mild, without behavioral disturbance, psychotic disturbance, mood disturbance, and anxiety: Secondary | ICD-10-CM | POA: Diagnosis not present

## 2022-05-06 DIAGNOSIS — F331 Major depressive disorder, recurrent, moderate: Secondary | ICD-10-CM | POA: Diagnosis not present

## 2022-05-07 ENCOUNTER — Other Ambulatory Visit (HOSPITAL_COMMUNITY): Payer: Self-pay | Admitting: Psychiatry

## 2022-05-22 ENCOUNTER — Ambulatory Visit: Payer: Medicare HMO | Attending: Cardiology | Admitting: Cardiology

## 2022-05-22 ENCOUNTER — Encounter: Payer: Self-pay | Admitting: Cardiology

## 2022-05-22 VITALS — BP 126/83 | HR 80 | Ht 68.5 in | Wt 215.0 lb

## 2022-05-22 DIAGNOSIS — R0789 Other chest pain: Secondary | ICD-10-CM | POA: Diagnosis not present

## 2022-05-22 DIAGNOSIS — I4891 Unspecified atrial fibrillation: Secondary | ICD-10-CM

## 2022-05-22 DIAGNOSIS — I6523 Occlusion and stenosis of bilateral carotid arteries: Secondary | ICD-10-CM

## 2022-05-22 NOTE — Patient Instructions (Signed)
Medication Instructions:  Your physician recommends that you continue on your current medications as directed. Please refer to the Current Medication list given to you today.  *If you need a refill on your cardiac medications before your next appointment, please call your pharmacy*   Lab Work: None If you have labs (blood work) drawn today and your tests are completely normal, you will receive your results only by: MyChart Message (if you have MyChart) OR A paper copy in the mail If you have any lab test that is abnormal or we need to change your treatment, we will call you to review the results.   Testing/Procedures: Your physician has requested that you have a carotid duplex. This test is an ultrasound of the carotid arteries in your neck. It looks at blood flow through these arteries that supply the brain with blood. Allow one hour for this exam. There are no restrictions or special instructions.    Follow-Up: At [facility], you and your health needs are our priority.  As part of our continuing mission to provide you with exceptional heart care, we have created designated Provider Care Teams.  These Care Teams include your primary Cardiologist (physician) and Advanced Practice Providers (APPs -  Physician Assistants and Nurse Practitioners) who all work together to provide you with the care you need, when you need it.  We recommend signing up for the patient portal called "MyChart".  Sign up information is provided on this After Visit Summary.  MyChart is used to connect with patients for Virtual Visits (Telemedicine).  Patients are able to view lab/test results, encounter notes, upcoming appointments, etc.  Non-urgent messages can be sent to your provider as well.   To learn more about what you can do with MyChart, go to [email or web address].    Your next appointment:   6 month(s)  Provider:   Jonathan Branch, MD    Other Instructions    

## 2022-05-22 NOTE — Progress Notes (Signed)
Clinical Summary Jay [name] is a 72 y.o.male seen today for follow up of the following medical problems.    1. Syncope - admit 02/2021  - was in garage, no prodrome. Next thing he remembers if getting up off the floor.  - isolated episode in the past of fall with bending over - CT showed small ICH managed conservatively.    05/2021 30 day monitor: afib avg rate 88.  - no recent episodes.    2. Afib - new diagnosis 05/02/21 - from ER note they discussed with neurosurgery anticoag, was cleared to start - toprol started during recent ER visit with chest pain, rates were elevated at the time.     - rare palpitatons, just few seconds - no bleeding on eliquis.    3. Carotid US 03/2021 RICA 50-69%, LICA 1-49%. BP discrepnacy right side suggesting right inonimate artery or subclavian stenosis, vertebral flow was normal   4. BP difference arms - reported during carotid study - from vitals today bp's are similar between arms.      5.Chest pain - ER visit 08/18/21 with chest pain. No evidence of ACS by ekg or enzymes - afib rates were elevated to 120s, started on toprol   - started 2-3 weeks prior - pressure midchest, 7-8/10 in severity. At rest or with exertion. +SOB, fatigued. Not positional. Would last few hours, constant.  - no palpitation - still with some episodes.   08/2021 nuclear stress: no ischemia. LVEF 45% 11/2021 echo: LVEF 55-60%  - some chest pains at times - about once a week. Pressure midchest, 5/10 in severity. Can occur at rest or with activity. +SOB, hot, sweaty. Not positional. Lasts few minutes. Some wheezing.    6. COPD - followed by pulmonary  Past Medical History:  Diagnosis Date   Anxiety    Arthritis    COPD (chronic obstructive pulmonary disease) (HCC)    Depression    Dysrhythmia    GERD (gastroesophageal reflux disease)    Hypertension    PAF (paroxysmal atrial fibrillation) (HCC)    Shortness of breath    with excertion      Allergies  Allergen Reactions   Wellbutrin [Bupropion]     Hyper, nervous and couldn't sleep     Current Outpatient Medications  Medication Sig Dispense Refill   acetaminophen (TYLENOL) 500 MG tablet Take 1,000 mg by mouth every 6 (six) hours as needed for moderate pain.     albuterol (VENTOLIN HFA) 108 (90 Base) MCG/ACT inhaler Inhale 2 puffs into the lungs every 4 (four) hours as needed for wheezing or shortness of breath.     amLODipine (NORVASC) 5 MG tablet TAKE ONE TABLET BY MOUTH EVERY MORNING 90 tablet 1   atorvastatin (LIPITOR) 20 MG tablet Take 20 mg by mouth at bedtime.     diphenhydrAMINE (BENADRYL) 25 MG tablet Take 25 mg by mouth daily.     donepezil (ARICEPT) 10 MG tablet TAKE ONE TABLET BY MOUTH EVERYDAY AT BEDTIME 90 tablet 1   DULoxetine (CYMBALTA) 30 MG capsule Take 1 capsule (30 mg total) by mouth at bedtime for 14 days, THEN 2 capsules (60 mg total) at bedtime. Take 60 mg in the morning as previously prescribed. Upon getting this prescription start taking 30 mg pill at bedtime as well. After 2 weeks increase that to 60 mg at bedtime.. 75 capsule 0   DULoxetine (CYMBALTA) 60 MG capsule Take 60 mg by mouth daily.     ELIQUIS  5 MG TABS tablet TAKE ONE TABLET BY MOUTH TWICE DAILY 180 tablet 0   esomeprazole (NEXIUM) 40 MG capsule Take 40 mg by mouth daily.     famotidine (PEPCID) 20 MG tablet Take 20 mg by mouth at bedtime.     Fluticasone-Umeclidin-Vilant (TRELEGY ELLIPTA) 100-62.5-25 MCG/ACT AEPB Inhale 1 puff into the lungs daily. 60 each 5   furosemide (LASIX) 20 MG tablet Take 20 mg daily as needed for weight gain over 2 lbs in 24 hours, or, 5 lbs in 1 week 90 tablet 3   lamoTRIgine (LAMICTAL) 200 MG tablet TAKE ONE TABLET BY MOUTH EVERY EVENING 90 tablet 1   lurasidone (LATUDA) 20 MG TABS tablet Take 1 tablet (20 mg total) by mouth daily with supper. 30 tablet 2   metoprolol tartrate (LOPRESSOR) 50 MG tablet Take 1 tablet (50 mg total) by mouth 2 (two) times  daily. (Patient taking differently: Take 25 mg by mouth 2 (two) times daily.) 60 tablet 1   Na Sulfate-K Sulfate-Mg Sulf 17.5-3.13-1.6 GM/177ML SOLN As directed 354 mL 0   traMADol (ULTRAM) 50 MG tablet Take 50 mg by mouth every 6 (six) hours as needed for severe pain.     triamcinolone cream (KENALOG) 0.1 % Apply 1 Application topically daily as needed (rash).     No current facility-administered medications for this visit.     Past Surgical History:  Procedure Laterality Date   APPENDECTOMY     COLONOSCOPY WITH PROPOFOL N/A 03/25/2022   Procedure: COLONOSCOPY WITH PROPOFOL;  Surgeon: Corbin Ade, MD;  [facility];  Service: Endoscopy;  Laterality: N/A;  9:00 am   EYE SURGERY     POLYPECTOMY  03/25/2022   Procedure: POLYPECTOMY INTESTINAL;  Surgeon: Corbin Ade, MD;  [facility];  Service: Endoscopy;;     Allergies  Allergen Reactions   Wellbutrin [Bupropion]     Hyper, nervous and couldn't sleep      Family History  Problem Relation Age of Onset   Hypertension Mother    Cancer Father    Hypertension Father    Hypertension Sister    Cancer Brother    Hypertension Brother    Cancer - Colon Neg Hx    Colon polyps Neg Hx      Social History Jay [name] reports that he quit smoking about 29 years ago. His smoking use included cigarettes. He has never used smokeless tobacco. Jay [name] reports no history of alcohol use.   Review of Systems CONSTITUTIONAL: No weight loss, fever, chills, weakness or fatigue.  HEENT: Eyes: No visual loss, blurred vision, double vision or yellow sclerae.No hearing loss, sneezing, congestion, runny nose or sore throat.  SKIN: No rash or itching.  CARDIOVASCULAR: per hpi RESPIRATORY: No shortness of breath, cough or sputum.  GASTROINTESTINAL: No anorexia, nausea, vomiting or diarrhea. No abdominal pain or blood.  GENITOURINARY: No burning on urination, no polyuria NEUROLOGICAL: No headache, dizziness, syncope,  paralysis, ataxia, numbness or tingling in the extremities. No change in bowel or bladder control.  MUSCULOSKELETAL: No muscle, back pain, joint pain or stiffness.  LYMPHATICS: No enlarged nodes. No history of splenectomy.  PSYCHIATRIC: No history of depression or anxiety.  ENDOCRINOLOGIC: No reports of sweating, cold or heat intolerance. No polyuria or polydipsia.  Marland Kitchen   Physical Examination Today's Vitals   05/22/22 1250  BP: 126/83  Pulse: 80  SpO2: 98%  Weight: 215 lb (97.5 kg)  Height: 5' 8.5" (1.74 m)   Body mass  index is 32.22 kg/m.  Gen: resting comfortably, no acute distress HEENT: no scleral icterus, pupils equal round and reactive, no palptable cervical adenopathy,  CV: irreg, no m/rg, no jvd Resp: Clear to auscultation bilaterally GI: abdomen is soft, non-tender, non-distended, normal bowel sounds, no hepatosplenomegaly MSK: extremities are warm, no edema.  Skin: warm, no rash Neuro:  no focal deficits Psych: appropriate affect   Diagnostic Studies  12/2020 monitor 2 day monitor Rare supraventricular ectopy in the form of isolated PACs, couplets, triplets. Isolated 5 beat run of SVT. Frequent ventricular ectopy in the form of isolated PVCs. Rare couplets. No symptoms reported     02/2021 echo   1. Left ventricular ejection fraction, by estimation, is 55 to 60%. The  left ventricle has normal function. The left ventricle has no regional  wall motion abnormalities. Left ventricular diastolic parameters are  consistent with Grade I diastolic  dysfunction (impaired relaxation).   2. Right ventricular systolic function is normal. The right ventricular  size is mildly enlarged.   3. Left atrial size was mildly dilated.   4. The mitral valve is normal in structure. Trivial mitral valve  regurgitation. No evidence of mitral stenosis.   5. The aortic valve is normal in structure. Aortic valve regurgitation is  not visualized. No aortic stenosis is present.   6.  The inferior vena cava is normal in size with greater than 50%  respiratory variability, suggesting right atrial pressure of 3 mmHg.      05/2021 monitor 30 day monitor Min HR 62, Max HR 171, Avg HR 88 Tele tracings show afib variable rates. One episode of symptoms correlated with afib rate 100 isolated PVC   08/2021 nuclear stress  The study is normal, there are no perfusion defects consistent with prior infarct or current ischemia. The study is intermediate risk purely based on LVEF 45%, consider echo to better evaluate LV function   No ST deviation was noted.   LV perfusion is normal.   Left ventricular function is abnormal. Nuclear stress EF: 45 %. The left ventricular ejection fraction is mildly decreased (45-54%). End diastolic cavity size is normal.  Assessment and Plan   1.Chest pain - negative stress test recently, no evidence symptoms are cardiac in etiology - notes some wheezing when pain comes on, perhaps COPD related. Asked to try using his prn albuterol during episodes. Discuss any further eval with pcp   2. Afib/acquired thrombophilia -doing well, continue current meds  3. Carotid stenosis - repeat carotid US  F/u 6 months     Antoine Poche, M.D.

## 2022-05-25 ENCOUNTER — Ambulatory Visit (HOSPITAL_BASED_OUTPATIENT_CLINIC_OR_DEPARTMENT_OTHER): Payer: Medicare HMO | Admitting: Psychiatry

## 2022-05-25 DIAGNOSIS — F331 Major depressive disorder, recurrent, moderate: Secondary | ICD-10-CM | POA: Diagnosis not present

## 2022-05-25 DIAGNOSIS — F411 Generalized anxiety disorder: Secondary | ICD-10-CM | POA: Diagnosis not present

## 2022-05-25 MED ORDER — LURASIDONE HCL 40 MG PO TABS: 40.0000 mg | ORAL_TABLET | Freq: Every day | ORAL | 1 refills | Status: DC

## 2022-05-25 MED ORDER — DULOXETINE HCL 60 MG PO CPEP: 60.0000 mg | ORAL_CAPSULE | Freq: Two times a day (BID) | ORAL | 0 refills | Status: DC

## 2022-05-25 MED ORDER — LAMOTRIGINE 200 MG PO TABS: 200.0000 mg | ORAL_TABLET | Freq: Every evening | ORAL | 1 refills | Status: DC

## 2022-05-25 NOTE — Progress Notes (Signed)
BH MD/PA/NP OP Progress Note  05/25/2022 2:57 PM TYVELL PRICKETT  MRN:  [id number]  Visit Diagnosis:    ICD-10-CM   1. GAD (generalized anxiety disorder)  F41.1 lamoTRIgine (LAMICTAL) 200 MG tablet    lurasidone (LATUDA) 40 MG TABS tablet    DULoxetine (CYMBALTA) 60 MG capsule    2. Moderate episode of recurrent major depressive disorder (HCC)  F33.1 lamoTRIgine (LAMICTAL) 200 MG tablet    lurasidone (LATUDA) 40 MG TABS tablet    DULoxetine (CYMBALTA) 60 MG capsule      Assessment: JOBEY CONG is a 72 y.o. male with a history of MDD, GAD, COPD, HTN,  who presented to [facility] for initial evaluation on 04/13/2022.    At initial evaluation patient reported neurovegetative symptoms of depression including low mood, anhedonia, amotivation, poor sleep, increased appetite, excessive tearfulness, fatigue, poor concentration, and intermittent passive SI.  He denied any passive SI at this time reporting that there is only been 2 episodes since 2022.  He denied any intent or plan since around 2014.  Safety planning and crisis resources were discussed. In addition to depression patient also reported significant anxiety.  He endorsed symptoms of constant worry that he is unable to control, difficulty relaxing, restlessness, increased irritability, and fear of something awful happening.  Patient did endorse having episodes of panic where he is short of breath, restless, diaphoretic, and can get muscle rigidity.  These were more frequent while on Wellbutrin though still occur occasionally since discontinuing.  Patient met criteria for MDD and GAD.  Dhairya Bodart [place] presents for follow-up evaluation. Today, 05/25/22, patient reports no notable change in mood however his affect was improved compared to his initial appointment.  Patient has tolerated the medication adjustments well though there is some concern of potential weight gain secondary to [facility] which we will  continue to monitor.  Weight gain could also be due to increased depression over the past several months.  We will increase Latuda to 40 mg today and reviewed the risk and benefits.  We will also rerefer patient for therapy.   Plan: - Cymbalta 60 mg BID for depressed mood/anxiety - Continue Lamictal 200 mg QHS for mood stabilization - Increase Latuda 40 mg w/dinner for depressed mood - Continue Donepezil 10 mg QD, managed by PCP - CMP and CBC reviewed - Therapy referral - Crisis resources reviewed - Follow up in a month   Chief Complaint:  Chief Complaint  Patient presents with   Follow-up   HPI: Naython presents with reporting initially that he has noticed no difference in his mood over the past month.  He then however went on to say that he has been having his crying spells less frequently and that he even got out to mow the lawn at the church for the first time in a while.  Patient also mentioned that Mother's Day went well yesterday with his son and grandkids calling to wish his wife happy Mother's Day and even asked about him which made him feel good.  While he does still struggle with fatigue and low motivation there does appear to be some improvement compared to a month ago.  Patient did acknowledge that this could be the case.  He also noted that his wife had a scan recently which showed no visible cancer in her colon which helped relieve some of his worries.  In regards to his medication patient is taking it consistently and denies any adverse side effects.  Of note he did go to his primary care recently and had his weight checked and found that he had gained about 15 pounds over the past few months.  While it is possible that this is secondary to the medication we will continue to monitor at this time.  He was open to continuing to titrate Jordan today for better effect in mood stabilization.  Patient also notes that he had completed intake paperwork for a therapist but has not heard  back yet.  He was open to being rereferred to a new therapist today.  Also spoke with patient's wife to review the plan.  Past Psychiatric History: Had seen a psychiatrist 2009, no prior psychiatric hospitalizations, no prior suicide attempts.  He denies ever being connected with a therapist.  Has tried Wellbutrin, Xanax, Restoril in the past  Denies current substance. Alcohol and marijuana 20-30 years ago.  Past Medical History:  Past Medical History:  Diagnosis Date   Anxiety    Arthritis    COPD (chronic obstructive pulmonary disease) (HCC)    Depression    Dysrhythmia    GERD (gastroesophageal reflux disease)    Hypertension    PAF (paroxysmal atrial fibrillation) (HCC)    Shortness of breath    with excertion    Past Surgical History:  Procedure Laterality Date   APPENDECTOMY     COLONOSCOPY WITH PROPOFOL N/A 03/25/2022   Procedure: COLONOSCOPY WITH PROPOFOL;  Surgeon: Corbin Ade, MD;  [facility];  Service: Endoscopy;  Laterality: N/A;  9:00 am   EYE SURGERY     POLYPECTOMY  03/25/2022   Procedure: POLYPECTOMY INTESTINAL;  Surgeon: Corbin Ade, MD;  [facility];  Service: Endoscopy;;    Family History:  Family History  Problem Relation Age of Onset   Hypertension Mother    Cancer Father    Hypertension Father    Hypertension Sister    Cancer Brother    Hypertension Brother    Cancer - Colon Neg Hx    Colon polyps Neg Hx     Social History:  Social History   Socioeconomic History   Marital status: Married    Spouse name: Not on file   Number of children: Not on file   Years of education: Not on file   Highest education level: Not on file  Occupational History   Not on file  Tobacco Use   Smoking status: Former    Types: Cigarettes    Quit date: 01/12/1993    Years since quitting: 29.3   Smokeless tobacco: Never  Vaping Use   Vaping Use: Never used  Substance and Sexual Activity   Alcohol use: No    Comment: Quit  1995   Drug use: No   Sexual activity: Not on file  Other Topics Concern   Not on file  Social History Narrative   Not on file   Social Determinants of Health   Financial Resource Strain: Low Risk  (04/01/2022)   Overall Financial Resource Strain (CARDIA)    Difficulty of Paying Living Expenses: Not hard at all  Food Insecurity: No Food Insecurity (04/01/2022)   Hunger Vital Sign    Worried About Running Out of Food in the Last Year: Never true    Ran Out of Food in the Last Year: Never true  Transportation Needs: No Transportation Needs (04/01/2022)   PRAPARE - Administrator, Civil Service (Medical): No    Lack of Transportation (Non-Medical): No  Physical Activity: Inactive (04/01/2022)   Exercise Vital Sign    Days of Exercise per Week: 0 days    Minutes of Exercise per Session: 0 min  Stress: Stress Concern Present (04/01/2022)   [employer] of Occupational Health - Occupational Stress Questionnaire    Feeling of Stress : To some extent  Social Connections: Socially Integrated (04/01/2022)   Social Connection and Isolation Panel [NHANES]    Frequency of Communication with Friends and Family: More than three times a week    Frequency of Social Gatherings with Friends and Family: More than three times a week    Attends Religious Services: More than 4 times per year    Active Member of [employer] or Organizations: Yes    Attends Engineer, structural: More than 4 times per year    Marital Status: Married    Allergies:  Allergies  Allergen Reactions   Bupropion     Hyper, nervous and couldn't sleep    Current Medications: Current Outpatient Medications  Medication Sig Dispense Refill   lurasidone (LATUDA) 40 MG TABS tablet Take 1 tablet (40 mg total) by mouth daily with breakfast. 30 tablet 1   acetaminophen (TYLENOL) 500 MG tablet Take 1,000 mg by mouth every 6 (six) hours as needed for moderate pain.     albuterol (VENTOLIN HFA) 108 (90 Base) MCG/ACT  inhaler Inhale 2 puffs into the lungs every 4 (four) hours as needed for wheezing or shortness of breath.     amLODipine (NORVASC) 5 MG tablet TAKE ONE TABLET BY MOUTH EVERY MORNING 90 tablet 1   atorvastatin (LIPITOR) 20 MG tablet Take 20 mg by mouth at bedtime.     diphenhydrAMINE (BENADRYL) 25 MG tablet Take 25 mg by mouth daily.     donepezil (ARICEPT) 10 MG tablet TAKE ONE TABLET BY MOUTH EVERYDAY AT BEDTIME 90 tablet 1   DULoxetine (CYMBALTA) 60 MG capsule Take 1 capsule (60 mg total) by mouth 2 (two) times daily. 180 capsule 0   ELIQUIS 5 MG TABS tablet TAKE ONE TABLET BY MOUTH TWICE DAILY 180 tablet 0   esomeprazole (NEXIUM) 40 MG capsule Take 40 mg by mouth daily.     famotidine (PEPCID) 20 MG tablet Take 20 mg by mouth at bedtime.     Fluticasone-Umeclidin-Vilant (TRELEGY ELLIPTA) 100-62.5-25 MCG/ACT AEPB Inhale 1 puff into the lungs daily. 60 each 5   lamoTRIgine (LAMICTAL) 200 MG tablet Take 1 tablet (200 mg total) by mouth every evening. 90 tablet 1   lurasidone (LATUDA) 20 MG TABS tablet Take 1 tablet (20 mg total) by mouth daily with supper. 30 tablet 2   metoprolol tartrate (LOPRESSOR) 50 MG tablet Take 1 tablet (50 mg total) by mouth 2 (two) times daily. 60 tablet 1   Na Sulfate-K Sulfate-Mg Sulf 17.5-3.13-1.6 GM/177ML SOLN As directed 354 mL 0   traMADol (ULTRAM) 50 MG tablet Take 50 mg by mouth every 6 (six) hours as needed for severe pain.     triamcinolone cream (KENALOG) 0.1 % Apply 1 Application topically daily as needed (rash).     No current facility-administered medications for this visit.     Musculoskeletal: Strength & Muscle Tone: within normal limits Gait & Station: normal Patient leans: N/A  Psychiatric Specialty Exam: Review of Systems  There were no vitals taken for this visit.There is no height or weight on file to calculate BMI.  General Appearance: Fairly Groomed  Eye Contact:  Good  Speech:  Clear and Coherent and Normal  Rate  Volume:  Normal   Mood:  Depressed  Affect:  Appropriate, Non-Congruent, and improved and no longer tearful compared to initial eval  Thought Process:  Coherent  Orientation:  Full (Time, Place, and Person)  Thought Content: Logical and Rumination   Suicidal Thoughts:  No  Homicidal Thoughts:  No  Memory:  Immediate;   Good  Judgement:  Fair  Insight:  Fair  Psychomotor Activity:  Normal  Concentration:  Concentration: Fair  Recall:  Poor  Fund of Knowledge: Fair  Language: Good  Akathisia:  No    AIMS (if indicated): done  Assets:  Communication Skills Desire for Improvement Social Support Transportation  ADL's:  Intact  Cognition: WNL  Sleep:  Good   Metabolic Disorder Labs: No results found for: "HGBA1C", "MPG" No results found for: "PROLACTIN" No results found for: "CHOL", "TRIG", "HDL", "CHOLHDL", "VLDL", "LDLCALC" Lab Results  Component Value Date   TSH 1.821 05/02/2021   TSH 0.843 03/03/2021    Therapeutic Level Labs: No results found for: "LITHIUM" No results found for: "VALPROATE" No results found for: "CBMZ"   Screenings: GAD-7    Flowsheet Row [facility]  Total GAD-7 Score 20      PHQ2-9    Flowsheet Row Office Visit from 04/13/2022 in [facility] Erroneous Encounter from 04/01/2022 in [facility] [place] Office Visit from 11/04/2021 in [facility] [place] Office Visit from 09/23/2021 in [facility] [place]  PHQ-2 Total Score 6 2 6 6   PHQ-9 Total Score 22 6 19 19       Flowsheet Row Office Visit from 04/13/2022 in [facility] Admission (Discharged) from 03/25/2022 in [place] [facility] Pre-Admission Testing 60 from 03/23/2022 in [place] [facility]  C-SSRS RISK CATEGORY Low Risk No Risk No Risk       Collaboration of Care:  Collaboration of Care: Medication Management AEB medication prescription and Other provider involved in patient's care AEB cardiology chart review  Patient/Guardian was advised Release of Information must be obtained prior to any record release in order to collaborate their care with an outside provider. Patient/Guardian was advised if they have not already done so to contact the registration department to sign all necessary forms in order for [place] to release information regarding their care.   Consent: Patient/Guardian gives verbal consent for treatment and assignment of benefits for services provided during this visit. Patient/Guardian expressed understanding and agreed to proceed.    Stasia Cavalier, MD 05/25/2022, 2:57 PM

## 2022-06-03 ENCOUNTER — Telehealth (HOSPITAL_COMMUNITY): Payer: Self-pay | Admitting: *Deleted

## 2022-06-03 NOTE — Telephone Encounter (Signed)
OPENED LOOKING FOR PROVIDER  NOTES

## 2022-06-04 ENCOUNTER — Ambulatory Visit (HOSPITAL_COMMUNITY)
Admission: RE | Admit: 2022-06-04 | Discharge: 2022-06-04 | Disposition: A | Discharge status: Home / Self Care | Payer: Medicare HMO | Source: Ambulatory Visit | Attending: Cardiology | Admitting: Cardiology

## 2022-06-04 DIAGNOSIS — I6523 Occlusion and stenosis of bilateral carotid arteries: Secondary | ICD-10-CM | POA: Diagnosis present

## 2022-06-11 ENCOUNTER — Ambulatory Visit (HOSPITAL_COMMUNITY): Payer: Medicare HMO | Admitting: Licensed Clinical Social Worker

## 2022-06-25 ENCOUNTER — Other Ambulatory Visit (HOSPITAL_COMMUNITY): Payer: Self-pay | Admitting: Psychiatry

## 2022-06-25 ENCOUNTER — Other Ambulatory Visit: Payer: Self-pay | Admitting: Adult Health

## 2022-06-25 DIAGNOSIS — F331 Major depressive disorder, recurrent, moderate: Secondary | ICD-10-CM

## 2022-06-25 DIAGNOSIS — F411 Generalized anxiety disorder: Secondary | ICD-10-CM

## 2022-06-29 ENCOUNTER — Ambulatory Visit (HOSPITAL_BASED_OUTPATIENT_CLINIC_OR_DEPARTMENT_OTHER): Payer: Medicare HMO | Admitting: Psychiatry

## 2022-06-29 ENCOUNTER — Other Ambulatory Visit (HOSPITAL_COMMUNITY): Payer: Self-pay | Admitting: Psychiatry

## 2022-06-29 ENCOUNTER — Other Ambulatory Visit: Payer: Self-pay | Admitting: Adult Health

## 2022-06-29 DIAGNOSIS — F411 Generalized anxiety disorder: Secondary | ICD-10-CM

## 2022-06-29 DIAGNOSIS — F331 Major depressive disorder, recurrent, moderate: Secondary | ICD-10-CM

## 2022-06-29 MED ORDER — LURASIDONE HCL 40 MG PO TABS: 40.0000 mg | ORAL_TABLET | Freq: Every day | ORAL | 1 refills | Status: DC

## 2022-06-29 NOTE — Progress Notes (Signed)
BH MD/PA/NP OP Progress Note  06/29/2022 3:20 PM Jay [name]  MRN:  [id number]  Visit Diagnosis:    ICD-10-CM   1. GAD (generalized anxiety disorder)  F41.1 lurasidone (LATUDA) 40 MG TABS tablet    2. Moderate episode of recurrent major depressive disorder (HCC)  F33.1 lurasidone (LATUDA) 40 MG TABS tablet      Assessment: Jay [name] is a 72 y.o. male with a history of MDD, GAD, COPD, HTN,  who presented to [facility] for initial evaluation on 04/13/2022.    At initial evaluation patient reported neurovegetative symptoms of depression including low mood, anhedonia, amotivation, poor sleep, increased appetite, excessive tearfulness, fatigue, poor concentration, and intermittent passive SI.  He denied any passive SI at this time reporting that there is only been 2 episodes since 2022.  He denied any intent or plan since around 2014.  Safety planning and crisis resources were discussed. In addition to depression patient also reported significant anxiety.  He endorsed symptoms of constant worry that he is unable to control, difficulty relaxing, restlessness, increased irritability, and fear of something awful happening.  Patient did endorse having episodes of panic where he is short of breath, restless, diaphoretic, and can get muscle rigidity.  These were more frequent while on Wellbutrin though still occur occasionally since discontinuing.  Patient met criteria for MDD and GAD.  Jay [name] presents for follow-up evaluation. Today, 06/29/22, patient reports minimal change in mood though he does acknowledge being a bit more active lately.  Patient continues to endorse feelings of fatigue.  He has tolerated the increase in [place] well and denies any adverse side effects.  We will continue on his current regimen at this time.  Behavioral activation techniques were reviewed and motivational techniques were used.  Plan: - Continue Cymbalta 60 mg BID for  depressed mood/anxiety - Continue Lamictal 200 mg QHS for mood stabilization - Continue Latuda 40 mg w/dinner for depressed mood - Continue Donepezil 10 mg QD, managed by PCP - CMP and CBC reviewed - Therapy referral - Crisis resources reviewed - Follow up in a month   Chief Complaint:  Chief Complaint  Patient presents with   Follow-up   HPI: Jay [name] presents reporting that the past month has been pretty decent.  The first week things were a bit rough with him being more tearful and having intermittent thoughts of suicide.  After that however things have been better.  He denied any intent to act on the suicidality and described it more as passing thoughts.  Jay [name] notes the trigger at the time was his back pain flaring up.  While patient notes the rest of the month have been better he still feels fairly fatigued day-to-day.  He describes having little desire to get up and do things.  He is pushed by his wife to do things for instance they went and got ice cream the other day and while he did not want to go initially he had a great time and really enjoyed it during and after.  We discussed the feelings of fatigue and the relationship to depression.  He explained that the lack of motivation and energy is a sign of depression and part of the way we improve it is by being more active.  We discussed behavioral activation techniques and encouraged patient to try to push himself a little bit more each day.  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  Spoke with the patient's wife after finishing with Jay [name], she reported that she has noticed marked improvement in his overall energy, mood, and motivation.  Past Psychiatric History: Had seen a psychiatrist 2009, no prior psychiatric  hospitalizations, no prior suicide attempts.  He denies ever being connected with a therapist.  Has tried Wellbutrin, Xanax, Restoril in the past  Denies current substance. Alcohol and marijuana 20-30 years ago.  Past Medical History:  Past Medical History:  Diagnosis Date   Anxiety    Arthritis    COPD (chronic obstructive pulmonary disease) (HCC)    Depression    Dysrhythmia    GERD (gastroesophageal reflux disease)    Hypertension    PAF (paroxysmal atrial fibrillation) (HCC)    Shortness of breath    with excertion    Past Surgical History:  Procedure Laterality Date   APPENDECTOMY     COLONOSCOPY WITH PROPOFOL N/A 03/25/2022   Procedure: COLONOSCOPY WITH PROPOFOL;  Surgeon: Corbin Ade, MD;  [facility]: [facility];  Service: Endoscopy;  Laterality: N/A;  9:00 am   EYE SURGERY     POLYPECTOMY  03/25/2022   Procedure: POLYPECTOMY INTESTINAL;  Surgeon: Corbin Ade, MD;  Location: [facility];  Service: Endoscopy;;    Family History:  Family History  Problem Relation Age of Onset   Hypertension Mother    Cancer Father    Hypertension Father    Hypertension Sister    Cancer Brother    Hypertension Brother    Cancer - Colon Neg Hx    Colon polyps Neg Hx     Social History:  Social History   Socioeconomic History   Marital status: Married    Spouse name: Not on file   Number of children: Not on file   Years of education: Not on file   Highest education level: Not on file  Occupational History   Not on file  Tobacco Use   Smoking status: Former    Types: Cigarettes    Quit date: 01/12/1993    Years since quitting: 29.4   Smokeless tobacco: Never  Vaping Use   Vaping Use: Never used  Substance and Sexual Activity   Alcohol use: No    Comment: Quit 1995   Drug use: No   Sexual activity: Not on file  Other Topics Concern   Not on file  Social History Narrative   Not on file   Social Determinants of Health   Financial Resource Strain: Low Risk   (04/01/2022)   Overall Financial Resource Strain (CARDIA)    Difficulty of Paying Living Expenses: Not hard at all  Food Insecurity: No Food Insecurity (04/01/2022)   Hunger Vital Sign    Worried About Running Out of Food in the Last Year: Never true    Ran Out of Food in the Last Year: Never true  Transportation Needs: No Transportation Needs (04/01/2022)   PRAPARE - Administrator, Civil Service (Medical): No    Lack of Transportation (Non-Medical): No  Physical Activity: Inactive (04/01/2022)   Exercise Vital Sign    Days of Exercise per Week: 0 days    Minutes of Exercise per Session: 0 min  Stress: Stress Concern Present (04/01/2022)   [employer]  of Occupational Health - Occupational Stress Questionnaire    Feeling of Stress : To some extent  Social Connections: Socially Integrated (04/01/2022)   Social Connection and Isolation Panel [NHANES]    Frequency of Communication with Friends and Family: More than three times a week    Frequency of Social Gatherings with Friends and Family: More than three times a week    Attends Religious Services: More than 4 times per year    Active Member of [employer] or Organizations: Yes    Attends Engineer, structural: More than 4 times per year    Marital Status: Married    Allergies:  Allergies  Allergen Reactions   Bupropion     Hyper, nervous and couldn't sleep    Current Medications: Current Outpatient Medications  Medication Sig Dispense Refill   acetaminophen (TYLENOL) 500 MG tablet Take 1,000 mg by mouth every 6 (six) hours as needed for moderate pain.     albuterol (VENTOLIN HFA) 108 (90 Base) MCG/ACT inhaler Inhale 2 puffs into the lungs every 4 (four) hours as needed for wheezing or shortness of breath.     amLODipine (NORVASC) 5 MG tablet TAKE ONE TABLET BY MOUTH EVERY MORNING 90 tablet 1   atorvastatin (LIPITOR) 20 MG tablet Take 20 mg by mouth at bedtime.     diphenhydrAMINE (BENADRYL) 25 MG tablet Take 25  mg by mouth daily.     donepezil (ARICEPT) 10 MG tablet TAKE ONE TABLET BY MOUTH EVERYDAY AT BEDTIME 90 tablet 1   DULoxetine (CYMBALTA) 60 MG capsule Take 1 capsule (60 mg total) by mouth 2 (two) times daily. 180 capsule 0   ELIQUIS 5 MG TABS tablet TAKE ONE TABLET BY MOUTH TWICE DAILY 180 tablet 0   esomeprazole (NEXIUM) 40 MG capsule Take 40 mg by mouth daily.     famotidine (PEPCID) 20 MG tablet Take 20 mg by mouth at bedtime.     Fluticasone-Umeclidin-Vilant (TRELEGY ELLIPTA) 100-62.5-25 MCG/ACT AEPB Inhale 1 puff into the lungs daily. 60 each 5   lamoTRIgine (LAMICTAL) 200 MG tablet Take 1 tablet (200 mg total) by mouth every evening. 90 tablet 1   lurasidone (LATUDA) 40 MG TABS tablet Take 1 tablet (40 mg total) by mouth daily with breakfast. 30 tablet 1   metoprolol tartrate (LOPRESSOR) 50 MG tablet Take 1 tablet (50 mg total) by mouth 2 (two) times daily. 60 tablet 1   Na Sulfate-K Sulfate-Mg Sulf 17.5-3.13-1.6 GM/177ML SOLN As directed 354 mL 0   traMADol (ULTRAM) 50 MG tablet Take 50 mg by mouth every 6 (six) hours as needed for severe pain.     triamcinolone cream (KENALOG) 0.1 % Apply 1 Application topically daily as needed (rash).     No current facility-administered medications for this visit.     Musculoskeletal: Strength & Muscle Tone: within normal limits Gait & Station: normal Patient leans: N/A  Psychiatric Specialty Exam: Review of Systems  There were no vitals taken for this visit.There is no height or weight on file to calculate BMI.  General Appearance: Fairly Groomed  Eye Contact:  Good  Speech:  Clear and Coherent and Normal Rate  Volume:  Normal  Mood:  Depressed and wife has noticed improved mood  Affect:  Appropriate  Thought Process:  Coherent  Orientation:  Full (Time, Place, and Person)  Thought Content: Logical and Rumination   Suicidal Thoughts:  No  Homicidal Thoughts:  No  Memory:  Immediate;   Good  Judgement:  Fair  Insight:  Fair   Psychomotor Activity:  Normal  Concentration:  Concentration: Fair  Recall:  Poor  Fund of Knowledge: Fair  Language: Good  Akathisia:  No    AIMS (if indicated): done  Assets:  Communication Skills Desire for Improvement Social Support Transportation  ADL's:  Intact  Cognition: WNL  Sleep:  Good   Metabolic Disorder Labs: No results found for: "HGBA1C", "MPG" No results found for: "PROLACTIN" No results found for: "CHOL", "TRIG", "HDL", "CHOLHDL", "VLDL", "LDLCALC" Lab Results  Component Value Date   TSH 1.821 05/02/2021   TSH 0.843 03/03/2021    Therapeutic Level Labs: No results found for: "LITHIUM" No results found for: "VALPROATE" No results found for: "CBMZ"   Screenings: GAD-7    Flowsheet Row [facility]  Total GAD-7 Score 20      PHQ2-9    Flowsheet Row Office Visit from 04/13/2022 in [facility] Erroneous Encounter from 04/01/2022 in [facility] [place] Office Visit from 11[facility]04/2021 in [facility] [place] Office Visit from 09/23/2021 in [facility] [place]  PHQ-2 Total Score 6 2 6 6   PHQ-9 Total Score 22 6 19 19       Flowsheet Row Office Visit from 04/13/2022 in [facility] Admission (Discharged) from 03/25/2022 in [facility] Pre-Admission Testing 60 from 03/23/2022 in [facility]  C-SSRS RISK CATEGORY Low Risk No Risk No Risk       Collaboration of Care: Collaboration of Care: Medication Management AEB medication prescription  Patient/Guardian was advised Release of Information must be obtained prior to any record release in order to collaborate their care with an outside provider. Patient/Guardian was advised if they have not already done so to contact the registration department to sign all necessary  forms in order for [place] to release information regarding their care.   Consent: Patient/Guardian gives verbal consent for treatment and assignment of benefits for services provided during this visit. Patient/Guardian expressed understanding and agreed to proceed.    Stasia Cavalier, MD 06/29/2022, 3:20 PM

## 2022-06-30 NOTE — Telephone Encounter (Signed)
Pt needs a follow up appt

## 2022-07-03 ENCOUNTER — Ambulatory Visit (INDEPENDENT_AMBULATORY_CARE_PROVIDER_SITE_OTHER): Payer: Medicare HMO

## 2022-07-03 VITALS — Ht 68.5 in | Wt 200.0 lb

## 2022-07-03 DIAGNOSIS — Z0000 Encounter for general adult medical examination without abnormal findings: Secondary | ICD-10-CM | POA: Diagnosis not present

## 2022-07-03 NOTE — Patient Instructions (Addendum)
Jay [name] , Thank you for taking time to come for your Medicare Wellness Visit. I appreciate your ongoing commitment to your health goals. Please review the following plan we discussed and let me know if I can assist you in the future.   These are the goals we discussed:  Goals       Patient stated (pt-stated)      I want to get healthy.        This is a list of the screening recommended for you and due dates:  Health Maintenance  Topic Date Due   DTaP/Tdap/Td vaccine (1 - Tdap) Never done   COVID-19 Vaccine (3 - Pfizer risk series) 07/19/2022*   Zoster (Shingles) Vaccine (1 of 2) 10/03/2022*   Pneumonia Vaccine (1 of 1 - PCV) 04/01/2023*   Hepatitis C Screening  04/01/2023*   Flu Shot  08/13/2022   Medicare Annual Wellness Visit  07/03/2023   Colon Cancer Screening  03/24/2032   HPV Vaccine  Aged Out  *Topic was postponed. The date shown is not the original due date.   Opioid Pain Medicine Management Opioids are powerful medicines that are used to treat moderate to severe pain. When used for short periods of time, they can help you to: Sleep better. Do better in physical or occupational therapy. Feel better in the first few days after an injury. Recover from surgery. Opioids should be taken with the supervision of a trained health care provider. They should be taken for the shortest period of time possible. This is because opioids can be addictive, and the longer you take opioids, the greater your risk of addiction. This addiction can also be called opioid use disorder. What are the risks? Using opioid pain medicines for longer than 3 days increases your risk of side effects. Side effects include: Constipation. Nausea and vomiting. Breathing difficulties (respiratory depression). Drowsiness. Confusion. Opioid use disorder. Itching. Taking opioid pain medicine for a long period of time can affect your ability to do daily tasks. It also puts you at risk for: Motor vehicle  crashes. Depression. Suicide. Heart attack. Overdose, which can be life-threatening. What is a pain treatment plan? A pain treatment plan is an agreement between you and your health care provider. Pain is unique to each person, and treatments vary depending on your condition. To manage your pain, you and your health care provider need to work together. To help you do this: Discuss the goals of your treatment, including how much pain you might expect to have and how you will manage the pain. Review the risks and benefits of taking opioid medicines. Remember that a good treatment plan uses more than one approach and minimizes the chance of side effects. Be honest about the amount of medicines you take and about any drug or alcohol use. Get pain medicine prescriptions from only one health care provider. Pain can be managed with many types of alternative treatments. Ask your health care provider to refer you to one or more specialists who can help you manage pain through: Physical or occupational therapy. Counseling (cognitive behavioral therapy). Good nutrition. Biofeedback. Massage. Meditation. Non-opioid medicine. Following a gentle exercise program. How to use opioid pain medicine Taking medicine Take your pain medicine exactly as told by your health care provider. Take it only when you need it. If your pain gets less severe, you may take less than your prescribed dose if your health care provider approves. If you are not having pain, do nottake pain medicine unless your  health care provider tells you to take it. If your pain is severe, do nottry to treat it yourself by taking more pills than instructed on your prescription. Contact your health care provider for help. Write down the times when you take your pain medicine. It is easy to become confused while on pain medicine. Writing the time can help you avoid overdose. Take other over-the-counter or prescription medicines only as told by  your health care provider. Keeping yourself and others safe  While you are taking opioid pain medicine: Do not drive, use machinery, or power tools. Do not sign legal documents. Do not drink alcohol. Do not take sleeping pills. Do not supervise children by yourself. Do not do activities that require climbing or being in high places. Do not go to a lake, river, ocean, spa, or swimming pool. Do not share your pain medicine with anyone. Keep pain medicine in a locked cabinet or in a secure area where pets and children cannot reach it. Stopping your use of opioids If you have been taking opioid medicine for more than a few weeks, you may need to slowly decrease (taper) how much you take until you stop completely. Tapering your use of opioids can decrease your risk of symptoms of withdrawal, such as: Pain and cramping in the abdomen. Nausea. Sweating. Sleepiness. Restlessness. Uncontrollable shaking (tremors). Cravings for the medicine. Do not attempt to taper your use of opioids on your own. Talk with your health care provider about how to do this. Your health care provider may prescribe a step-down schedule based on how much medicine you are taking and how long you have been taking it. Getting rid of leftover pills Do not save any leftover pills. Get rid of leftover pills safely by: Taking the medicine to a prescription take-back program. This is usually offered by the county or law enforcement. Bringing them to a pharmacy that has a drug disposal container. Flushing them down the toilet. Check the label or package insert of your medicine to see whether this is safe to do. Throwing them out in the trash. Check the label or package insert of your medicine to see whether this is safe to do. If it is safe to throw it out, remove the medicine from the original container, put it into a sealable bag or container, and mix it with used coffee grounds, food scraps, dirt, or cat litter before putting  it in the trash. Follow these instructions at home: Activity Do exercises as told by your health care provider. Avoid activities that make your pain worse. Return to your normal activities as told by your health care provider. Ask your health care provider what activities are safe for you. General instructions You may need to take these actions to prevent or treat constipation: Drink enough fluid to keep your urine pale yellow. Take over-the-counter or prescription medicines. Eat foods that are high in fiber, such as beans, whole grains, and fresh fruits and vegetables. Limit foods that are high in fat and processed sugars, such as fried or sweet foods. Keep all follow-up visits. This is important. Where to find support If you have been taking opioids for a long time, you may benefit from receiving support for quitting from a local support group or counselor. Ask your health care provider for a referral to these resources in your area. Where to find more information Centers for Disease Control and Prevention (CDC): FootballExhibition.com.br [place] Food and Drug Administration (FDA): PumpkinSearch.com.ee Get help right away if: You  may have taken too much of an opioid (overdosed). Common symptoms of an overdose: Your breathing is slower or more shallow than normal. You have a very slow heartbeat (pulse). You have slurred speech. You have nausea and vomiting. Your pupils become very small. You have other potential symptoms: You are very confused. You faint or feel like you will faint. You have cold, clammy skin. You have blue lips or fingernails. You have thoughts of harming yourself or harming others. These symptoms may represent a serious problem that is an emergency. Do not wait to see if the symptoms will go away. Get medical help right away. Call your local emergency services (911 in the [place] Do not drive yourself to the hospital.  If you ever feel like you may hurt yourself or others, or have thoughts  about taking your own life, get help right away. Go to your nearest emergency department or: Call your local emergency services (911 in the [place] Call the [facility] ((951)435-4175 in the [place] Call a suicide crisis helpline, such as the National Suicide Prevention Lifeline at (562)041-7347 or 988 in the [place] This is open 24 hours a day in the [place] Text the Crisis Text Line at 319-868-5555 (in the [place] Summary Opioid medicines can help you manage moderate to severe pain for a short period of time. A pain treatment plan is an agreement between you and your health care provider. Discuss the goals of your treatment, including how much pain you might expect to have and how you will manage the pain. If you think that you or someone else may have taken too much of an opioid, get medical help right away. This information is not intended to replace advice given to you by your health care provider. Make sure you discuss any questions you have with your health care provider. Document Revised: 07/24/2020 Document Reviewed: 04/10/2020 Elsevier Patient Education  2024 [employer]  Advanced directives: Advance directive discussed with you today. Even though you declined this today, please call our office should you change your mind, and we can give you the proper paperwork for you to fill out.   Conditions/risks identified: None  Next appointment: Follow up in one year for your annual wellness visit.   Preventive Care 76 Years and Older, Male  Preventive care refers to lifestyle choices and visits with your health care provider that can promote health and wellness. What does preventive care include? A yearly physical exam. This is also called an annual well check. Dental exams once or twice a year. Routine eye exams. Ask your health care provider how often you should have your eyes checked. Personal lifestyle choices, including: Daily care of your teeth and gums. Regular physical  activity. Eating a healthy diet. Avoiding tobacco and drug use. Limiting alcohol use. Practicing safe sex. Taking low doses of aspirin every day. Taking vitamin and mineral supplements as recommended by your health care provider. What happens during an annual well check? The services and screenings done by your health care provider during your annual well check will depend on your age, overall health, lifestyle risk factors, and family history of disease. Counseling  Your health care provider may ask you questions about your: Alcohol use. Tobacco use. Drug use. Emotional well-being. Home and relationship well-being. Sexual activity. Eating habits. History of falls. Memory and ability to understand (cognition). Work and work Astronomer. Screening  You may have the following tests or measurements: Height, weight, and BMI. Blood pressure. Lipid and cholesterol  levels. These may be checked every 5 years, or more frequently if you are over 66 years old. Skin check. Lung cancer screening. You may have this screening every year starting at age 48 if you have a 30-pack-year history of smoking and currently smoke or have quit within the past 15 years. Fecal occult blood test (FOBT) of the stool. You may have this test every year starting at age 35. Flexible sigmoidoscopy or colonoscopy. You may have a sigmoidoscopy every 5 years or a colonoscopy every 10 years starting at age 40. Prostate cancer screening. Recommendations will vary depending on your family history and other risks. Hepatitis C blood test. Hepatitis B blood test. Sexually transmitted disease (STD) testing. Diabetes screening. This is done by checking your blood sugar (glucose) after you have not eaten for a while (fasting). You may have this done every 1-3 years. Abdominal aortic aneurysm (AAA) screening. You may need this if you are a current or former smoker. Osteoporosis. You may be screened starting at age 45 if you are  at high risk. Talk with your health care provider about your test results, treatment options, and if necessary, the need for more tests. Vaccines  Your health care provider may recommend certain vaccines, such as: Influenza vaccine. This is recommended every year. Tetanus, diphtheria, and acellular pertussis (Tdap, Td) vaccine. You may need a Td booster every 10 years. Zoster vaccine. You may need this after age 20. Pneumococcal 13-valent conjugate (PCV13) vaccine. One dose is recommended after age 39. Pneumococcal polysaccharide (PPSV23) vaccine. One dose is recommended after age 11. Talk to your health care provider about which screenings and vaccines you need and how often you need them. This information is not intended to replace advice given to you by your health care provider. Make sure you discuss any questions you have with your health care provider. Document Released: 01/25/2015 Document Revised: 09/18/2015 Document Reviewed: 10/30/2014 Elsevier Interactive Patient Education  2017 [employer].  Fall Prevention in the Home Falls can cause injuries. They can happen to people of all ages. There are many things you can do to make your home safe and to help prevent falls. What can I do on the outside of my home? Regularly fix the edges of walkways and driveways and fix any cracks. Remove anything that might make you trip as you walk through a door, such as a raised step or threshold. Trim any bushes or trees on the path to your home. Use bright outdoor lighting. Clear any walking paths of anything that might make someone trip, such as rocks or tools. Regularly check to see if handrails are loose or broken. Make sure that both sides of any steps have handrails. Any raised decks and porches should have guardrails on the edges. Have any leaves, snow, or ice cleared regularly. Use sand or salt on walking paths during winter. Clean up any spills in your garage right away. This includes oil  or grease spills. What can I do in the bathroom? Use night lights. Install grab bars by the toilet and in the tub and shower. Do not use towel bars as grab bars. Use non-skid mats or decals in the tub or shower. If you need to sit down in the shower, use a plastic, non-slip stool. Keep the floor dry. Clean up any water that spills on the floor as soon as it happens. Remove soap buildup in the tub or shower regularly. Attach bath mats securely with double-sided non-slip rug tape. Do not have  throw rugs and other things on the floor that can make you trip. What can I do in the bedroom? Use night lights. Make sure that you have a light by your bed that is easy to reach. Do not use any sheets or blankets that are too big for your bed. They should not hang down onto the floor. Have a firm chair that has side arms. You can use this for support while you get dressed. Do not have throw rugs and other things on the floor that can make you trip. What can I do in the kitchen? Clean up any spills right away. Avoid walking on wet floors. Keep items that you use a lot in easy-to-reach places. If you need to reach something above you, use a strong step stool that has a grab bar. Keep electrical cords out of the way. Do not use floor polish or wax that makes floors slippery. If you must use wax, use non-skid floor wax. Do not have throw rugs and other things on the floor that can make you trip. What can I do with my stairs? Do not leave any items on the stairs. Make sure that there are handrails on both sides of the stairs and use them. Fix handrails that are broken or loose. Make sure that handrails are as long as the stairways. Check any carpeting to make sure that it is firmly attached to the stairs. Fix any carpet that is loose or worn. Avoid having throw rugs at the top or bottom of the stairs. If you do have throw rugs, attach them to the floor with carpet tape. Make sure that you have a light  switch at the top of the stairs and the bottom of the stairs. If you do not have them, ask someone to add them for you. What else can I do to help prevent falls? Wear shoes that: Do not have high heels. Have rubber bottoms. Are comfortable and fit you well. Are closed at the toe. Do not wear sandals. If you use a stepladder: Make sure that it is fully opened. Do not climb a closed stepladder. Make sure that both sides of the stepladder are locked into place. Ask someone to hold it for you, if possible. Clearly mark and make sure that you can see: Any grab bars or handrails. First and last steps. Where the edge of each step is. Use tools that help you move around (mobility aids) if they are needed. These include: Canes. Walkers. Scooters. Crutches. Turn on the lights when you go into a dark area. Replace any light bulbs as soon as they burn out. Set up your furniture so you have a clear path. Avoid moving your furniture around. If any of your floors are uneven, fix them. If there are any pets around you, be aware of where they are. Review your medicines with your doctor. Some medicines can make you feel dizzy. This can increase your chance of falling. Ask your doctor what other things that you can do to help prevent falls. This information is not intended to replace advice given to you by your health care provider. Make sure you discuss any questions you have with your health care provider. Document Released: 10/25/2008 Document Revised: 06/06/2015 Document Reviewed: 02/02/2014 Elsevier Interactive Patient Education  2017 [employer].

## 2022-07-03 NOTE — Progress Notes (Signed)
Subjective:   Jay [name] is a 72 y.o. male who presents for Medicare Annual/Subsequent preventive examination.  Visit Complete: Virtual  I connected with  [name] on 07/03/22 by a audio enabled telemedicine application and verified that I am speaking with the correct person using two identifiers.  Patient Location: Home  Provider Location: Home Office  I discussed the limitations of evaluation and management by telemedicine. The patient expressed understanding and agreed to proceed.  Patient Medicare AWV questionnaire was completed by the patient on ; I have confirmed that all information answered by patient is correct and no changes since this date.  Review of Systems     Cardiac Risk Factors include: advanced age (>57men, >52 women);male gender;hypertension     Objective:    Today's Vitals   07/03/22 1507  Weight: 200 lb (90.7 kg)  Height: 5' 8.5" (1.74 m)   Body mass index is 29.97 kg/m.     07/03/2022    3:18 PM 04/01/2022    2:50 PM 03/25/2022    7:32 AM 03/23/2022   10:14 AM 10/03/2021   12:10 PM 05/02/2021    5:12 PM 03/24/2021    8:00 AM  Advanced Directives  Does Patient Have a Medical Advance Directive? No No No No No No No  Would patient like information on creating a medical advance directive? No - Patient declined No - Patient declined No - Patient declined No - Patient declined Yes (ED - Information included in AVS) Yes (ED - Information included in AVS) Yes (MAU/Ambulatory/Procedural Areas - Information given)    Current Medications (verified) Outpatient Encounter Medications as of 07/03/2022  Medication Sig   acetaminophen (TYLENOL) 500 MG tablet Take 1,000 mg by mouth every 6 (six) hours as needed for moderate pain.   albuterol (VENTOLIN HFA) 108 (90 Base) MCG/ACT inhaler Inhale 2 puffs into the lungs every 4 (four) hours as needed for wheezing or shortness of breath.   amLODipine (NORVASC) 5 MG tablet TAKE ONE TABLET BY MOUTH EVERY MORNING    atorvastatin (LIPITOR) 20 MG tablet Take 20 mg by mouth at bedtime.   diphenhydrAMINE (BENADRYL) 25 MG tablet Take 25 mg by mouth daily.   donepezil (ARICEPT) 10 MG tablet TAKE ONE TABLET BY MOUTH EVERYDAY AT BEDTIME   DULoxetine (CYMBALTA) 60 MG capsule Take 1 capsule (60 mg total) by mouth 2 (two) times daily.   ELIQUIS 5 MG TABS tablet TAKE ONE TABLET BY MOUTH TWICE DAILY   esomeprazole (NEXIUM) 40 MG capsule Take 40 mg by mouth daily.   famotidine (PEPCID) 20 MG tablet Take 20 mg by mouth at bedtime.   Fluticasone-Umeclidin-Vilant (TRELEGY ELLIPTA) 100-62.5-25 MCG/ACT AEPB Inhale 1 puff into the lungs daily.   lamoTRIgine (LAMICTAL) 200 MG tablet Take 1 tablet (200 mg total) by mouth every evening.   lurasidone (LATUDA) 40 MG TABS tablet Take 1 tablet (40 mg total) by mouth daily with breakfast.   metoprolol tartrate (LOPRESSOR) 50 MG tablet Take 1 tablet (50 mg total) by mouth 2 (two) times daily.   Na Sulfate-K Sulfate-Mg Sulf 17.5-3.13-1.6 GM/177ML SOLN As directed   traMADol (ULTRAM) 50 MG tablet Take 50 mg by mouth every 6 (six) hours as needed for severe pain.   triamcinolone cream (KENALOG) 0.1 % Apply 1 Application topically daily as needed (rash).   No facility-administered encounter medications on file as of 07/03/2022.    Allergies (verified) Bupropion   History: Past Medical History:  Diagnosis Date   Anxiety  Arthritis    COPD (chronic obstructive pulmonary disease) (HCC)    Depression    Dysrhythmia    GERD (gastroesophageal reflux disease)    Hypertension    PAF (paroxysmal atrial fibrillation) (HCC)    Shortness of breath    with excertion   Past Surgical History:  Procedure Laterality Date   APPENDECTOMY     COLONOSCOPY WITH PROPOFOL N/A 03/25/2022   Procedure: COLONOSCOPY WITH PROPOFOL;  Surgeon: Corbin Ade, MD;  Location: [facility];  Service: Endoscopy;  Laterality: N/A;  9:00 am   EYE SURGERY     POLYPECTOMY  03/25/2022   Procedure:  POLYPECTOMY INTESTINAL;  Surgeon: Corbin Ade, MD;  Location: [facility];  Service: Endoscopy;;   Family History  Problem Relation Age of Onset   Hypertension Mother    Cancer Father    Hypertension Father    Hypertension Sister    Cancer Brother    Hypertension Brother    Cancer - Colon Neg Hx    Colon polyps Neg Hx    Social History   Socioeconomic History   Marital status: Married    Spouse name: Not on file   Number of children: Not on file   Years of education: Not on file   Highest education level: Not on file  Occupational History   Not on file  Tobacco Use   Smoking status: Former    Types: Cigarettes    Quit date: 01/12/1993    Years since quitting: 29.4   Smokeless tobacco: Never  Vaping Use   Vaping Use: Never used  Substance and Sexual Activity   Alcohol use: No    Comment: Quit 1995   Drug use: No   Sexual activity: Not on file  Other Topics Concern   Not on file  Social History Narrative   Not on file   Social Determinants of Health   Financial Resource Strain: Low Risk  (07/03/2022)   Overall Financial Resource Strain (CARDIA)    Difficulty of Paying Living Expenses: Not hard at all  Food Insecurity: No Food Insecurity (07/03/2022)   Hunger Vital Sign    Worried About Running Out of Food in the Last Year: Never true    Ran Out of Food in the Last Year: Never true  Transportation Needs: No Transportation Needs (07/03/2022)   PRAPARE - Administrator, Civil Service (Medical): No    Lack of Transportation (Non-Medical): No  Physical Activity: Inactive (07/03/2022)   Exercise Vital Sign    Days of Exercise per Week: 0 days    Minutes of Exercise per Session: 0 min  Stress: No Stress Concern Present (07/03/2022)   [employer] of Occupational Health - Occupational Stress Questionnaire    Feeling of Stress : Not at all  Social Connections: Socially Integrated (07/03/2022)   Social Connection and Isolation Panel [NHANES]     Frequency of Communication with Friends and Family: More than three times a week    Frequency of Social Gatherings with Friends and Family: More than three times a week    Attends Religious Services: More than 4 times per year    Active Member of [employer] or Organizations: Yes    Attends Engineer, structural: More than 4 times per year    Marital Status: Married    Tobacco Counseling Counseling given: Not Answered   Clinical Intake:  Pre-visit preparation completed: No  Pain : No/denies pain Activities of Daily Living  07/03/2022    3:15 PM 04/01/2022    2:45 PM  In your present state of health, do you have any difficulty performing the following activities:  Hearing? 0 0  Vision? 0 0  Difficulty concentrating or making decisions? 0 1  Comment  Wife assist  Walking or climbing stairs? 0 0  Dressing or bathing? 1 1  Comment Wife assist Wife assist  Doing errands, shopping? 1 1  Comment Wife assist Wife assist  Preparing Food and eating ? Y Y  Comment Wife assist Wife assist  Using the Toilet? N N  In the past six months, have you accidently leaked urine? N N  Do you have problems with loss of bowel control? N N  Managing your Medications? N Y  Comment  Wife assist  Managing your Finances? N Y  Comment  Wife assist  Housekeeping or managing your Housekeeping? [place]  Comment  Wife assist    Patient Care Team: Shirline Frees, NP as PCP - General (Family Medicine) Branch, Dorothe Pea, MD as PCP - Cardiology (Cardiology) Jena Gauss Gerrit Friends, MD as Consulting Physician (Gastroenterology)  Indicate any recent Medical Services you may have received from other than [facility] providers in the past year (date may be approximate).     Assessment:   This is a routine wellness examination for [place].  Hearing/Vision screen Hearing Screening - Comments:: Denies hearing difficulties   Vision Screening - Comments:: Wears rx glasses - up to date with routine eye exams with  [facility]  Dietary issues and exercise activities discussed:     Goals Addressed   None    Depression Screen    07/03/2022    3:13 PM 04/13/2022    9:01 AM 04/01/2022    2:42 PM 11/04/2021    1:03 PM 09/23/2021    1:15 PM  PHQ 2/9 Scores  PHQ - 2 Score 0  2 6 6   PHQ- 9 Score   6 19 19      Information is confidential and restricted. Go to Review Flowsheets to unlock data.    Fall Risk    07/03/2022    3:17 PM 04/01/2022    2:47 PM  Fall Risk   Falls in the past year? 1 1  Number falls in past yr: 0 0  Injury with Fall? 0 0  Comment  No medical attention required  Risk for fall due to : No Fall Risks Impaired balance/gait  Follow up Falls prevention discussed Falls prevention discussed    MEDICARE RISK AT HOME:  Medicare Risk at Home - 07/03/22 1522     Any stairs in or around the home? Yes    If so, are there any without handrails? No    Home free of loose throw rugs in walkways, pet beds, electrical cords, etc? Yes    Adequate lighting in your home to reduce risk of falls? Yes    Life alert? No    Use of a cane, walker or w/c? No    Grab bars in the bathroom? Yes    Shower chair or bench in shower? No    Elevated toilet seat or a handicapped toilet? No             TIMED UP AND GO:  Was the test performed?      Cognitive Function:        07/03/2022    3:18 PM 04/01/2022    2:50 PM  6CIT Screen  What Year? 0  points 0 points  What month? 0 points 0 points  What time? 0 points 0 points  Count back from 20 0 points 0 points  Months in reverse 0 points 0 points  Repeat phrase 0 points 0 points  Total Score 0 points 0 points    Immunizations Immunization History  Administered Date(s) Administered   Influenza-Unspecified 09/11/2021   PFIZER(Purple Top)SARS-COV-2 Vaccination 08/31/2019, 09/21/2019   Pneumococcal-Unspecified 09/11/2021    TDAP status: Due, Education has been provided regarding the importance of this vaccine. Advised may receive this  vaccine at local pharmacy or Health Dept. Aware to provide a copy of the vaccination record if obtained from local pharmacy or Health Dept. Verbalized acceptance and understanding.  Flu Vaccine status: Up to date  Pneumococcal vaccine status: Up to date  Covid-19 vaccine status: Completed vaccines  Qualifies for Shingles Vaccine? Yes   Zostavax completed No   Shingrix Completed?: No.    Education has been provided regarding the importance of this vaccine. Patient has been advised to call insurance company to determine out of pocket expense if they have not yet received this vaccine. Advised may also receive vaccine at local pharmacy or Health Dept. Verbalized acceptance and understanding.  Screening Tests Health Maintenance  Topic Date Due   DTaP/Tdap/Td (1 - Tdap) Never done   COVID-19 Vaccine (3 - Pfizer risk series) 07/19/2022 (Originally 10/19/2019)   Zoster Vaccines- Shingrix (1 of 2) 10/03/2022 (Originally 09/21/1969)   Pneumonia Vaccine 55+ Years old (1 of 1 - PCV) 04/01/2023 (Originally 09/22/2015)   Hepatitis C Screening  04/01/2023 (Originally 09/21/1968)   INFLUENZA VACCINE  08/13/2022   Medicare Annual Wellness (AWV)  07/03/2023   Colonoscopy  03/24/2032   HPV VACCINES  Aged Out    Health Maintenance  Health Maintenance Due  Topic Date Due   DTaP/Tdap/Td (1 - Tdap) Never done    Colorectal cancer screening: Type of screening: Colonoscopy. Completed 03/25/22. Repeat every 10 years  Lung Cancer Screening: (Low Dose CT Chest recommended if Age 55-80 years, 20 pack-year currently smoking OR have quit w/in 15years.) does not qualify.     Additional Screening:    Vision Screening: Recommended annual ophthalmology exams for early detection of glaucoma and other disorders of the eye. Is the patient up to date with their annual eye exam?  Yes  Who is the provider or what is the name of the office in which the patient attends annual eye exams? [facility] If pt is not  established with a provider, would they like to be referred to a provider to establish care? No .   Dental Screening: Recommended annual dental exams for proper oral hygiene   Community Resource Referral / Chronic Care Management:  CRR required this visit?  No   CCM required this visit?  No     Plan:     I have personally reviewed and noted the following in the patient's chart:   Medical and social history Use of alcohol, tobacco or illicit drugs  Current medications and supplements including opioid prescriptions. Patient is currently taking opioid prescriptions. Information provided to patient regarding non-opioid alternatives. Patient advised to discuss non-opioid treatment plan with their provider. Functional ability and status Nutritional status Physical activity Advanced directives List of other physicians Hospitalizations, surgeries, and ER visits in previous 12 months Vitals Screenings to include cognitive, depression, and falls Referrals and appointments  In addition, I have reviewed and discussed with patient certain preventive protocols, quality metrics, and best practice recommendations.  A written personalized care plan for preventive services as well as general preventive health recommendations were provided to patient.     Tillie Rung, LPN   0/10/2723   After Visit Summary: (MyChart) Due to this being a telephonic visit, the after visit summary with patients personalized plan was offered to patient via MyChart   Nurse Notes: Patient request f/u with concerns of refills on Triamcinolone(Kenalog) 0.1% Cream for continued itching.

## 2022-07-06 ENCOUNTER — Other Ambulatory Visit: Payer: Self-pay | Admitting: Adult Health

## 2022-07-08 ENCOUNTER — Encounter: Payer: Self-pay | Admitting: Adult Health

## 2022-07-08 ENCOUNTER — Ambulatory Visit (INDEPENDENT_AMBULATORY_CARE_PROVIDER_SITE_OTHER): Payer: Medicare HMO | Admitting: Adult Health

## 2022-07-08 VITALS — BP 120/80 | HR 79 | Temp 97.9°F | Ht 68.5 in | Wt 215.0 lb

## 2022-07-08 DIAGNOSIS — N401 Enlarged prostate with lower urinary tract symptoms: Secondary | ICD-10-CM | POA: Diagnosis not present

## 2022-07-08 DIAGNOSIS — E785 Hyperlipidemia, unspecified: Secondary | ICD-10-CM

## 2022-07-08 DIAGNOSIS — R4189 Other symptoms and signs involving cognitive functions and awareness: Secondary | ICD-10-CM | POA: Diagnosis not present

## 2022-07-08 DIAGNOSIS — J449 Chronic obstructive pulmonary disease, unspecified: Secondary | ICD-10-CM

## 2022-07-08 DIAGNOSIS — Z0000 Encounter for general adult medical examination without abnormal findings: Secondary | ICD-10-CM

## 2022-07-08 DIAGNOSIS — I10 Essential (primary) hypertension: Secondary | ICD-10-CM

## 2022-07-08 DIAGNOSIS — I4811 Longstanding persistent atrial fibrillation: Secondary | ICD-10-CM | POA: Diagnosis not present

## 2022-07-08 DIAGNOSIS — F418 Other specified anxiety disorders: Secondary | ICD-10-CM | POA: Diagnosis not present

## 2022-07-08 DIAGNOSIS — M545 Low back pain, unspecified: Secondary | ICD-10-CM

## 2022-07-08 DIAGNOSIS — R351 Nocturia: Secondary | ICD-10-CM

## 2022-07-08 DIAGNOSIS — G8929 Other chronic pain: Secondary | ICD-10-CM

## 2022-07-08 LAB — COMPREHENSIVE METABOLIC PANEL
ALT: 20 U/L (ref 0–53)
AST: 15 U/L (ref 0–37)
Albumin: 4.2 g/dL (ref 3.5–5.2)
Alkaline Phosphatase: 88 U/L (ref 39–117)
BUN: 18 mg/dL (ref 6–23)
CO2: 30 mEq/L (ref 19–32)
Calcium: 9.4 mg/dL (ref 8.4–10.5)
Chloride: 102 mEq/L (ref 96–112)
Creatinine, Ser: 1.32 mg/dL (ref 0.40–1.50)
GFR: 54.16 mL/min — ABNORMAL LOW (ref >60.00)
Glucose, Bld: 89 mg/dL (ref 70–99)
Potassium: 4.5 mEq/L (ref 3.5–5.1)
Sodium: 140 mEq/L (ref 135–145)
Total Bilirubin: 0.6 mg/dL (ref 0.2–1.2)
Total Protein: 7 g/dL (ref 6.0–8.3)

## 2022-07-08 LAB — CBC
HCT: 46.5 % (ref 39.0–52.0)
Hemoglobin: 15 g/dL (ref 13.0–17.0)
MCHC: 32.2 g/dL (ref 30.0–36.0)
MCV: 90.9 fl (ref 78.0–100.0)
Platelets: 255 10*3/uL (ref 150.0–400.0)
RBC: 5.12 Mil/uL (ref 4.22–5.81)
RDW: 14.9 % (ref 11.5–15.5)
WBC: 8.7 10*3/uL (ref 4.0–10.5)

## 2022-07-08 LAB — URINALYSIS
Bilirubin Urine: NEGATIVE
Hgb urine dipstick: NEGATIVE
Ketones, ur: NEGATIVE
Leukocytes,Ua: NEGATIVE
Nitrite: NEGATIVE
Specific Gravity, Urine: 1.025 (ref 1.000–1.030)
Urine Glucose: NEGATIVE
Urobilinogen, UA: 2 — AB (ref 0.0–1.0)
pH: 6.5 (ref 5.0–8.0)

## 2022-07-08 LAB — LIPID PANEL
Cholesterol: 183 mg/dL (ref 0–200)
HDL: 53 mg/dL (ref >39.00)
LDL Cholesterol: 115 mg/dL — ABNORMAL HIGH (ref 0–99)
NonHDL: 129.74
Total CHOL/HDL Ratio: 3
Triglycerides: 74 mg/dL (ref 0.0–149.0)
VLDL: 14.8 mg/dL (ref 0.0–40.0)

## 2022-07-08 LAB — TSH: TSH: 2.77 u[IU]/mL (ref 0.35–5.50)

## 2022-07-08 LAB — PSA: PSA: 0.97 ng/mL (ref 0.10–4.00)

## 2022-07-08 LAB — HEMOGLOBIN A1C: Hgb A1c MFr Bld: 6.1 % (ref 4.6–6.5)

## 2022-07-08 MED ORDER — APIXABAN 5 MG PO TABS: 5.0000 mg | ORAL_TABLET | Freq: Two times a day (BID) | ORAL | 0 refills | Status: DC

## 2022-07-08 MED ORDER — TRIAMCINOLONE ACETONIDE 0.1 % EX CREA: 1.0000 | TOPICAL_CREAM | Freq: Every day | CUTANEOUS | 2 refills | Status: DC | PRN

## 2022-07-08 MED ORDER — METOPROLOL TARTRATE 50 MG PO TABS
50.0000 mg | ORAL_TABLET | Freq: Two times a day (BID) | ORAL | 3 refills | Status: DC
Start: 2022-07-08 — End: 2022-12-30

## 2022-07-08 MED ORDER — TAMSULOSIN HCL 0.4 MG PO CAPS: 0.4000 mg | ORAL_CAPSULE | Freq: Every day | ORAL | 3 refills | Status: DC

## 2022-07-08 MED ORDER — ATORVASTATIN CALCIUM 20 MG PO TABS: 20.0000 mg | ORAL_TABLET | Freq: Every day | ORAL | 3 refills | Status: DC

## 2022-07-08 MED ORDER — TRAMADOL HCL 50 MG PO TABS: 50.0000 mg | ORAL_TABLET | Freq: Four times a day (QID) | ORAL | 2 refills | Status: DC | PRN

## 2022-07-08 MED ORDER — AMLODIPINE BESYLATE 5 MG PO TABS: 5.0000 mg | ORAL_TABLET | Freq: Every morning | ORAL | 1 refills | Status: DC

## 2022-07-08 MED ORDER — DONEPEZIL HCL 10 MG PO TABS: ORAL_TABLET | ORAL | 3 refills | Status: DC

## 2022-07-08 NOTE — Patient Instructions (Addendum)
It was great seeing you today   We will follow up with you regarding your lab work   Please let me know if you need anything   Follow up in 6 months

## 2022-07-08 NOTE — Progress Notes (Signed)
Subjective:    Patient ID: Jay [name], male    DOB: 1950/07/19, 72 y.o.   MRN: [id number]  HPI Patient presents for yearly preventative medicine examination. He is a pleasant 72 year old male who  has a past medical history of Anxiety, Arthritis, COPD (chronic obstructive pulmonary disease) (HCC), Depression, Dysrhythmia, GERD (gastroesophageal reflux disease), Hypertension, PAF (paroxysmal atrial fibrillation) (HCC), and Shortness of breath.  Hypertension-managed with Norvasc 5 mg daily and metoprolol 25 mg twice daily. He denies dizziness, lightheadedness or blurred vision  BP Readings from Last 3 Encounters:  07/08/22 120/80  06/29/22 102/67  05/22/22 126/83    Atrial Fibrillation -newly diagnosed in April 2023.  Currently managed with Eliquis 5 mg BID  and metoprolol 25 mg BID.  He is followed by cardiology  Anxiety/Depression -managed by psychiatry with Cymbalta 60 mg BID, Lamictal 200 mg at bedtime and Latuda 40 daily. He continues to have fatigue and does not feel like doing anything. He was referred to therapy but cannot afford this with his fixed income.   Cognitive Impairment - managed with Aricept 10 mg daily. Feels as though his memory is " pretty good". He and his wife feel like there have been no changes  COPD - Currently maintained Trelegy Ellipta 100-62.5-25mcg . He is seen by pulmonary. He feels as though his symptoms are controleld with Trelegy.   Hyperlipidemia - takes Lipitor 20 mg daily   Chronic back pain - needs a refill of Tramadol, he has not had any in " along time". He has been in pain for quite some time. Active makes the pain worse and he does not want to exercise due to the pain. [facility] but is interested in seeing someone else for chronic back pain. His last MRI of lumbar spine was in 12/2020 which showed IMPRESSION: 1. Mild multilevel lumbar degenerative disc disease without spinal canal or neural foraminal  stenosis. 2. Moderate left L4-5 and left L5-S1 facet arthrosis. 3. Fatty infiltration of the right paraspinous muscles.  BPH -he reports that for quite some time he has been experiencing decreased stream, incomplete bladder emptying and nocturia where he gets up multiple times a night to urinate.  He has not had any dysuria, hematuria, lower pelvic pain.   All immunizations and health maintenance protocols were reviewed with the patient and needed orders were placed.  Appropriate screening laboratory values were ordered for the patient including screening of hyperlipidemia, renal function and hepatic function. If indicated by BPH, a PSA was ordered.  Medication reconciliation,  past medical history, social history, problem list and allergies were reviewed in detail with the patient  Goals were established with regard to weight loss, exercise, and  diet in compliance with medications. He has not been very active lately.   Wt Readings from Last 3 Encounters:  07/08/22 215 lb (97.5 kg)  07/03/22 200 lb (90.7 kg)  06/29/22 200 lb (90.7 kg)   Review of Systems  Constitutional:  Positive for activity change and fatigue.  Respiratory: Negative.    Cardiovascular: Negative.   Gastrointestinal: Negative.   Genitourinary:  Positive for decreased urine volume and difficulty urinating.  Musculoskeletal:  Positive for arthralgias and back pain.  Neurological: Negative.   Hematological: Negative.   Psychiatric/Behavioral:  Positive for dysphoric mood. Negative for self-injury, sleep disturbance and suicidal ideas. The patient is nervous/anxious.    Past Medical History:  Diagnosis Date   Anxiety    Arthritis  COPD (chronic obstructive pulmonary disease) (HCC)    Depression    Dysrhythmia    GERD (gastroesophageal reflux disease)    Hypertension    PAF (paroxysmal atrial fibrillation) (HCC)    Shortness of breath    with excertion    Social History   Socioeconomic History    Marital status: Married    Spouse name: Not on file   Number of children: Not on file   Years of education: Not on file   Highest education level: Not on file  Occupational History   Not on file  Tobacco Use   Smoking status: Former    Types: Cigarettes    Quit date: 01/12/1993    Years since quitting: 29.5   Smokeless tobacco: Never  Vaping Use   Vaping Use: Never used  Substance and Sexual Activity   Alcohol use: No    Comment: Quit 1995   Drug use: No   Sexual activity: Not on file  Other Topics Concern   Not on file  Social History Narrative   Not on file   Social Determinants of Health   Financial Resource Strain: Low Risk  (07/03/2022)   Overall Financial Resource Strain (CARDIA)    Difficulty of Paying Living Expenses: Not hard at all  Food Insecurity: No Food Insecurity (07/03/2022)   Hunger Vital Sign    Worried About Running Out of Food in the Last Year: Never true    Ran Out of Food in the Last Year: Never true  Transportation Needs: No Transportation Needs (07/03/2022)   PRAPARE - Administrator, Civil Service (Medical): No    Lack of Transportation (Non-Medical): No  Physical Activity: Inactive (07/03/2022)   Exercise Vital Sign    Days of Exercise per Week: 0 days    Minutes of Exercise per Session: 0 min  Stress: No Stress Concern Present (07/03/2022)   [employer] of Occupational Health - Occupational Stress Questionnaire    Feeling of Stress : Not at all  Social Connections: Socially Integrated (07/03/2022)   Social Connection and Isolation Panel [NHANES]    Frequency of Communication with Friends and Family: More than three times a week    Frequency of Social Gatherings with Friends and Family: More than three times a week    Attends Religious Services: More than 4 times per year    Active Member of Clubs or Organizations: Yes    Attends Banker Meetings: More than 4 times per year    Marital Status: Married  Careers information officer Violence: Not At Risk (07/03/2022)   Humiliation, Afraid, Rape, and Kick questionnaire    Fear of Current or Ex-Partner: No    Emotionally Abused: No    Physically Abused: No    Sexually Abused: No    Past Surgical History:  Procedure Laterality Date   APPENDECTOMY     COLONOSCOPY WITH PROPOFOL N/A 03/25/2022   Procedure: COLONOSCOPY WITH PROPOFOL;  Surgeon: Corbin Ade, MD;  [facility];  Service: Endoscopy;  Laterality: N/A;  9:00 am   EYE SURGERY     POLYPECTOMY  03/25/2022   Procedure: POLYPECTOMY INTESTINAL;  Surgeon: Corbin Ade, MD;  Location: [facility];  Service: Endoscopy;;    Family History  Problem Relation Age of Onset   Hypertension Mother    Cancer Father    Hypertension Father    Hypertension Sister    Cancer Brother    Hypertension Brother    Cancer -  Colon Neg Hx    Colon polyps Neg Hx     Allergies  Allergen Reactions   Bupropion     Hyper, nervous and couldn't sleep    Current Outpatient Medications on File Prior to Visit  Medication Sig Dispense Refill   acetaminophen (TYLENOL) 500 MG tablet Take 1,000 mg by mouth every 6 (six) hours as needed for moderate pain.     albuterol (VENTOLIN HFA) 108 (90 Base) MCG/ACT inhaler Inhale 2 puffs into the lungs every 4 (four) hours as needed for wheezing or shortness of breath.     diphenhydrAMINE (BENADRYL) 25 MG tablet Take 25 mg by mouth daily.     DULoxetine (CYMBALTA) 60 MG capsule Take 1 capsule (60 mg total) by mouth 2 (two) times daily. 180 capsule 0   esomeprazole (NEXIUM) 40 MG capsule Take 40 mg by mouth daily.     famotidine (PEPCID) 20 MG tablet Take 20 mg by mouth at bedtime.     Fluticasone-Umeclidin-Vilant (TRELEGY ELLIPTA) 100-62.5-25 MCG/ACT AEPB Inhale 1 puff into the lungs daily. 60 each 5   lamoTRIgine (LAMICTAL) 200 MG tablet Take 1 tablet (200 mg total) by mouth every evening. 90 tablet 1   lurasidone (LATUDA) 40 MG TABS tablet Take 1 tablet (40 mg total) by  mouth daily with breakfast. 30 tablet 1   Na Sulfate-K Sulfate-Mg Sulf 17.5-3.13-1.6 GM/177ML SOLN As directed 354 mL 0   No current facility-administered medications on file prior to visit.    BP 120/80   Pulse 79   Temp 97.9 F (36.6 C) (Oral)   Ht 5' 8.5" (1.74 m)   Wt 215 lb (97.5 kg)   SpO2 97%   BMI 32.22 kg/m       Objective:   Physical Exam Vitals and nursing note reviewed.  Constitutional:      Appearance: Normal appearance. He is obese.  HENT:     Right Ear: Tympanic membrane, ear canal and external ear normal. There is no impacted cerumen.     Left Ear: Tympanic membrane, ear canal and external ear normal. There is no impacted cerumen.     Mouth/Throat:     Mouth: Mucous membranes are moist.     Dentition: Has dentures.     Pharynx: Oropharynx is clear.  Eyes:     Extraocular Movements: Extraocular movements intact.     Conjunctiva/sclera: Conjunctivae normal.     Pupils: Pupils are equal, round, and reactive to light.  Cardiovascular:     Rate and Rhythm: Normal rate and regular rhythm.     Pulses: Normal pulses.     Heart sounds: Normal heart sounds.  Pulmonary:     Effort: Pulmonary effort is normal.     Breath sounds: Normal breath sounds.  Abdominal:     General: Abdomen is flat. Bowel sounds are normal.     Palpations: Abdomen is soft.  Musculoskeletal:        General: Normal range of motion.  Skin:    General: Skin is warm and dry.  Neurological:     General: No focal deficit present.     Mental Status: He is alert and oriented to person, place, and time.  Psychiatric:        Mood and Affect: Mood normal.        Behavior: Behavior normal.        Thought Content: Thought content normal.        Judgment: Judgment normal.       Assessment &  Plan:  1. Routine general medical examination at a health care facility Today patient counseled on age appropriate routine health concerns for screening and prevention, each reviewed and up to date or  declined. Immunizations reviewed and up to date or declined. Labs ordered and reviewed. Risk factors for depression reviewed and negative. Hearing function and visual acuity are intact. ADLs screened and addressed as needed. Functional ability and level of safety reviewed and appropriate. Education, counseling and referrals performed based on assessed risks today. Patient provided with a copy of personalized plan for preventive services. - Tdap at pharmacy  - Follow up in one year or sooner if neededd 2. Primary hypertension - Well controlled. No change in medication  - amLODipine (NORVASC) 5 MG tablet; Take 1 tablet (5 mg total) by mouth every morning.  Dispense: 90 tablet; Refill: 1 - metoprolol tartrate (LOPRESSOR) 50 MG tablet; Take 1 tablet (50 mg total) by mouth 2 (two) times daily.  Dispense: 90 tablet; Refill: 3 - Lipid panel; Future - TSH; Future - CBC; Future - Comprehensive metabolic panel; Future - Hemoglobin A1c - Comprehensive metabolic panel - CBC - TSH - Lipid panel  3. Cognitive impairment - Stable. Continue Aricept - donepezil (ARICEPT) 10 MG tablet; TAKE ONE TABLET BY MOUTH EVERYDAY AT BEDTIME  Dispense: 90 tablet; Refill: 3 - Lipid panel; Future - TSH; Future - CBC; Future - Comprehensive metabolic panel; Future - Hemoglobin A1c - Comprehensive metabolic panel - CBC - TSH - Lipid panel  4. Longstanding persistent atrial fibrillation (HCC) - Encouraged to follow up with Cardiology as directed - apixaban (ELIQUIS) 5 MG TABS tablet; Take 1 tablet (5 mg total) by mouth 2 (two) times daily.  Dispense: 180 tablet; Refill: 0 - Lipid panel; Future - TSH; Future - CBC; Future - Comprehensive metabolic panel; Future - Hemoglobin A1c; Future - Comprehensive metabolic panel - CBC - TSH - Lipid panel - metoprolol tartrate (LOPRESSOR) 50 MG tablet; Take 1 tablet (50 mg total) by mouth 2 (two) times daily.  Dispense: 90 tablet; Refill: 3  5. Depression with anxiety -  Per psychiatry   6. Chronic obstructive pulmonary disease, unspecified COPD type (HCC) - Per pulmoanry   7. Hyperlipidemia, unspecified hyperlipidemia type  - atorvastatin (LIPITOR) 20 MG tablet; Take 1 tablet (20 mg total) by mouth at bedtime.  Dispense: 90 tablet; Refill: 3 - Lipid panel; Future - TSH; Future - CBC; Future - Comprehensive metabolic panel; Future - Comprehensive metabolic panel - CBC - TSH - Lipid panel  8. Chronic midline low back pain without sciatica  - traMADol (ULTRAM) 50 MG tablet; Take 1 tablet (50 mg total) by mouth every 6 (six) hours as needed for severe pain.  Dispense: 30 tablet; Refill: 2 - Ambulatory referral to Orthopedics  9. Benign prostatic hyperplasia with nocturia - Will start on Flomax 0.4 mg daily.  - Follow up if  not effective - tamsulosin (FLOMAX) 0.4 MG CAPS capsule; Take 1 capsule (0.4 mg total) by mouth daily.  Dispense: 90 capsule; Refill: 3 - PSA; Future - Urinalysis; Future - Urinalysis - PSA  Shirline Frees, NP

## 2022-07-09 ENCOUNTER — Other Ambulatory Visit: Payer: Self-pay | Admitting: Adult Health

## 2022-07-09 MED ORDER — ATORVASTATIN CALCIUM 40 MG PO TABS: 40.0000 mg | ORAL_TABLET | Freq: Every day | ORAL | 3 refills | Status: DC

## 2022-07-13 ENCOUNTER — Other Ambulatory Visit: Payer: Self-pay | Admitting: Adult Health

## 2022-08-05 ENCOUNTER — Other Ambulatory Visit: Payer: Self-pay | Admitting: Adult Health

## 2022-08-07 ENCOUNTER — Telehealth: Payer: Self-pay | Admitting: Adult Health

## 2022-08-07 NOTE — Telephone Encounter (Signed)
Pt wife is calling and pt is in donut hole and would like samples of eliquis 5 mg

## 2022-08-07 NOTE — Telephone Encounter (Signed)
Called pt to advised that samples have been placed in front office. Pt did not answer. Left detailed message on vm.

## 2022-08-11 ENCOUNTER — Ambulatory Visit (HOSPITAL_COMMUNITY): Payer: Medicare HMO | Admitting: Psychiatry

## 2022-08-17 ENCOUNTER — Other Ambulatory Visit (HOSPITAL_COMMUNITY): Payer: Self-pay | Admitting: Physical Medicine & Rehabilitation

## 2022-08-17 ENCOUNTER — Ambulatory Visit (HOSPITAL_COMMUNITY)
Admission: RE | Admit: 2022-08-17 | Discharge: 2022-08-17 | Disposition: A | Discharge status: Home / Self Care | Payer: Medicare HMO | Source: Ambulatory Visit | Attending: Physical Medicine & Rehabilitation | Admitting: Physical Medicine & Rehabilitation

## 2022-08-17 DIAGNOSIS — M542 Cervicalgia: Secondary | ICD-10-CM

## 2022-08-24 ENCOUNTER — Other Ambulatory Visit (HOSPITAL_COMMUNITY): Payer: Self-pay | Admitting: Psychiatry

## 2022-08-24 DIAGNOSIS — F411 Generalized anxiety disorder: Secondary | ICD-10-CM

## 2022-08-24 DIAGNOSIS — F331 Major depressive disorder, recurrent, moderate: Secondary | ICD-10-CM

## 2022-09-17 ENCOUNTER — Telehealth: Payer: Self-pay | Admitting: Adult Health

## 2022-09-17 MED ORDER — FAMOTIDINE 20 MG PO TABS: 20.0000 mg | ORAL_TABLET | Freq: Every day | ORAL | 0 refills | Status: DC

## 2022-09-17 MED ORDER — ESOMEPRAZOLE MAGNESIUM 40 MG PO CPDR: 40.0000 mg | DELAYED_RELEASE_CAPSULE | Freq: Every morning | ORAL | 0 refills | Status: DC

## 2022-09-17 NOTE — Telephone Encounter (Signed)
Prescription Request  09/17/2022  LOV: 07/08/2022  What is the name of the medication or equipment?  famotidine (PEPCID) 20 MG tablet esomeprazole (NEXIUM) 40 MG capsule  Have you contacted your pharmacy to request a refill? Yes   Which pharmacy would you like this sent to?  [facility] [address], [place] - [id number] [place] [address] Phone: [phone]  Fax: [phone]        Patient notified that their request is being sent to the clinical staff for review and that they should receive a response within 2 business days.   Please advise at Mobile [phone] (mobile)

## 2022-09-17 NOTE — Addendum Note (Signed)
Addended by: Elwin Mocha on: 09/17/2022 04:03 PM   Modules accepted: Orders

## 2022-10-01 ENCOUNTER — Telehealth: Payer: Self-pay

## 2022-10-01 NOTE — Telephone Encounter (Signed)
..  Pre-operative Risk Assessment    Patient Name: Jay [name]  DOB: 1950-10-07 MRN: [id number]  LO/V 05/22/22 NEXT O/V 11/27/22    Request for Surgical Clearance    Procedure:   LESI  Date of Surgery:  Clearance TBD                                 Surgeon:  Loreli Slot Surgeon's Group or Practice Name:  [facility] Phone number:  [phone] Fax number:  [phone]   Type of Clearance Requested:   - Medical  - Pharmacy:  Hold Apixaban (Eliquis) HOLD 3 DAYS BEFORE   Type of Anesthesia:  Not Indicated   Additional requests/questions:    Jola Babinski   10/01/2022, 1:39 PM

## 2022-10-02 ENCOUNTER — Telehealth: Payer: Self-pay | Admitting: Cardiology

## 2022-10-02 MED ORDER — APIXABAN 5 MG PO TABS: 5.0000 mg | ORAL_TABLET | Freq: Two times a day (BID) | ORAL | 0 refills | Status: DC

## 2022-10-02 NOTE — Telephone Encounter (Signed)
I have attempted to contact this patient by phone about setting up a tele visit for pre-op clearance but VM is full.  I will continue to try later.

## 2022-10-02 NOTE — Telephone Encounter (Addendum)
Pt requesting samples of Eliquis.  Indication: afib  Last office visit: [place], 05/22/2022 Scr: 1.32, 07/08/2022 Age: 72 yo  Weight: 97.5 kg    Pt on the correct dose of Eliquis 5mg  BID.

## 2022-10-02 NOTE — Telephone Encounter (Signed)
Patient calling the office for samples of medication:   1.  What medication and dosage are you requesting samples for? apixaban (ELIQUIS) 5 MG TABS tablet  2.  Are you currently out of this medication?   No. Patient's wife states the patient has 4 tablets remaining, but he is currently in the donut hole.

## 2022-10-02 NOTE — Telephone Encounter (Signed)
Name: Jay [name]  DOB: 06/14/1950  MRN: [id number]  Primary Cardiologist: Dina Rich, MD   Preoperative team, please contact this patient and set up a phone call appointment for further preoperative risk assessment. Please obtain consent and complete medication review. Thank you for your help.  I confirm that guidance regarding antiplatelet and oral anticoagulation therapy has been completed and, if necessary, noted below.  Per Pharm D, patient may hold Eliquis for 3 days prior to procedure.     Carlos Levering, NP 10/02/2022, 1:28 PM [facility]

## 2022-10-02 NOTE — Telephone Encounter (Signed)
Patient with diagnosis of afib on Eliquis for anticoagulation.    Procedure: L ESI Date of procedure: TBD  CHA2DS2-VASc Score = 3  This indicates a 3.2% annual risk of stroke. The patient's score is based upon: CHF History: 0 HTN History: 1 Diabetes History: 0 Stroke History: 0 Vascular Disease History: 1 Age Score: 1 Gender Score: 0   CrCl 37mL/min using adjusted body weight Platelet count 255K  Per office protocol, patient can hold Eliquis for 3 days prior to procedure as requested.    **This guidance is not considered finalized until pre-operative APP has relayed final recommendations.**

## 2022-10-02 NOTE — Telephone Encounter (Signed)
Patient notified to pick up samples and PAF from [place] office.

## 2022-10-05 NOTE — Telephone Encounter (Signed)
2nd attempt to reach pt to schedule appointment. Lvm

## 2022-10-06 ENCOUNTER — Telehealth: Payer: Self-pay

## 2022-10-06 NOTE — Telephone Encounter (Signed)
Spoke with patient who is agreeable to do a tele visit on 10/8 at 2:40 pm. Med rec and consent done.

## 2022-10-06 NOTE — Telephone Encounter (Signed)
  Patient Consent for Virtual Visit        Jay [name] has provided verbal consent on 10/06/2022 for a virtual visit (video or telephone).   CONSENT FOR VIRTUAL VISIT FOR:  Jay [name]  By participating in this virtual visit I agree to the following:  I hereby voluntarily request, consent and authorize [facility] and its employed or contracted physicians, physician assistants, nurse practitioners or other licensed health care professionals (the Practitioner), to provide me with telemedicine health care services (the "Services") as deemed necessary by the treating Practitioner. I acknowledge and consent to receive the Services by the Practitioner via telemedicine. I understand that the telemedicine visit will involve communicating with the Practitioner through live audiovisual communication technology and the disclosure of certain medical information by electronic transmission. I acknowledge that I have been given the opportunity to request an in-person assessment or other available alternative prior to the telemedicine visit and am voluntarily participating in the telemedicine visit.  I understand that I have the right to withhold or withdraw my consent to the use of telemedicine in the course of my care at any time, without affecting my right to future care or treatment, and that the Practitioner or I may terminate the telemedicine visit at any time. I understand that I have the right to inspect all information obtained and/or recorded in the course of the telemedicine visit and may receive copies of available information for a reasonable fee.  I understand that some of the potential risks of receiving the Services via telemedicine include:  Delay or interruption in medical evaluation due to technological equipment failure or disruption; Information transmitted may not be sufficient (e.g. poor resolution of images) to allow for appropriate medical decision making by the  Practitioner; and/or  In rare instances, security protocols could fail, causing a breach of personal health information.  Furthermore, I acknowledge that it is my responsibility to provide information about my medical history, conditions and care that is complete and accurate to the best of my ability. I acknowledge that Practitioner's advice, recommendations, and/or decision may be based on factors not within their control, such as incomplete or inaccurate data provided by me or distortions of diagnostic images or specimens that may result from electronic transmissions. I understand that the practice of medicine is not an exact science and that Practitioner makes no warranties or guarantees regarding treatment outcomes. I acknowledge that a copy of this consent can be made available to me via my patient portal [facility] MyChart), or I can request a printed copy by calling the office of  [facility].    I understand that my insurance will be billed for this visit.   I have read or had this consent read to me. I understand the contents of this consent, which adequately explains the benefits and risks of the Services being provided via telemedicine.  I have been provided ample opportunity to ask questions regarding this consent and the Services and have had my questions answered to my satisfaction. I give my informed consent for the services to be provided through the use of telemedicine in my medical care

## 2022-10-14 ENCOUNTER — Telehealth (HOSPITAL_COMMUNITY): Payer: Self-pay | Admitting: *Deleted

## 2022-10-14 ENCOUNTER — Other Ambulatory Visit: Payer: Self-pay | Admitting: Adult Health

## 2022-10-14 ENCOUNTER — Other Ambulatory Visit (HOSPITAL_COMMUNITY): Payer: Self-pay | Admitting: Psychiatry

## 2022-10-14 DIAGNOSIS — F331 Major depressive disorder, recurrent, moderate: Secondary | ICD-10-CM

## 2022-10-14 DIAGNOSIS — F411 Generalized anxiety disorder: Secondary | ICD-10-CM

## 2022-10-14 MED ORDER — DULOXETINE HCL 60 MG PO CPEP
60.0000 mg | ORAL_CAPSULE | Freq: Two times a day (BID) | ORAL | 0 refills | Status: DC
Start: 2022-10-14 — End: 2022-11-12

## 2022-10-14 NOTE — Telephone Encounter (Signed)
Rx Refill Request--  [id number] - , [place] - [address]   DULoxetine (CYMBALTA) 60 MG capsule   Next Appt --10/20/22 Last Appt --  06/29/22

## 2022-10-14 NOTE — Telephone Encounter (Signed)
Refill sent to the pharmacy 

## 2022-10-14 NOTE — Addendum Note (Signed)
Addended by: Everlena Cooper on: 10/14/2022 10:11 AM   Modules accepted: Orders

## 2022-10-18 NOTE — Progress Notes (Unsigned)
Virtual Visit via Telephone Note   Because of Jay [name]'s co-morbid illnesses, he is at least at moderate risk for complications without adequate follow up.  This format is felt to be most appropriate for this patient at this time.  The patient did not have access to video technology/had technical difficulties with video requiring transitioning to audio format only (telephone).  All issues noted in this document were discussed and addressed.  No physical exam could be performed with this format.  Please refer to the patient's chart for his consent to telehealth for [facility].  Evaluation Performed:  Preoperative cardiovascular risk assessment _____________   Date:  10/20/2022   Patient ID:  Jay [name], DOB Apr 08, 1950, MRN [id number] Patient Location:  Home Provider location:   Office  Primary Care Provider:  Shirline Frees, NP Primary Cardiologist:  Dina Rich, MD  Chief Complaint / Patient Profile   72 y.o. y/o male with a h/o syncope, with history of small ICH managed conservatively, atrial fibrillation on anticoagulation therapy with Eliquis, carotid artery disease, chronic chest pain with no evidence of ACS with most recent nuclear medicine stress test August 2023 with no ischemia, LVEF of 55 to 60% per echo 12/01/2021.  He is pending  LESI through [facility] on date to be determined and presents today for telephonic preoperative cardiovascular risk assessment.  History of Present Illness    Jay [name] is a 72 y.o. male who presents via audio/video conferencing for a telehealth visit today.  Pt was last seen in cardiology clinic on 05/22/2022 by Dr. Dina Rich.  At that time Jay [name] was doing well and stable from cardiac standpoint..  The patient is now pending procedure as outlined above. Since his last visit, he   Past Medical History    Past Medical History:  Diagnosis Date   Anxiety    Arthritis    COPD  (chronic obstructive pulmonary disease) (HCC)    Depression    Dysrhythmia    GERD (gastroesophageal reflux disease)    Hypertension    PAF (paroxysmal atrial fibrillation) (HCC)    Shortness of breath    with excertion   Past Surgical History:  Procedure Laterality Date   APPENDECTOMY     COLONOSCOPY WITH PROPOFOL N/A 03/25/2022   Procedure: COLONOSCOPY WITH PROPOFOL;  Surgeon: Corbin Ade, MD;  [facility];  Service: Endoscopy;  Laterality: N/A;  9:00 am   EYE SURGERY     POLYPECTOMY  03/25/2022   Procedure: POLYPECTOMY INTESTINAL;  Surgeon: Corbin Ade, MD;  Location: [facility];  Service: Endoscopy;;    Allergies  Allergies  Allergen Reactions   Bupropion     Hyper, nervous and couldn't sleep    Home Medications    Prior to Admission medications   Medication Sig Start Date End Date Taking? Authorizing Provider  acetaminophen (TYLENOL) 500 MG tablet Take 1,000 mg by mouth every 6 (six) hours as needed for moderate pain.    [provider]  albuterol (VENTOLIN HFA) 108 (90 Base) MCG/ACT inhaler Inhale 2 puffs into the lungs every 4 (four) hours as needed for wheezing or shortness of breath.    [provider]  amLODipine (NORVASC) 5 MG tablet Take 1 tablet (5 mg total) by mouth every morning. 07/08/22   Nafziger, Kandee Keen, NP  apixaban (ELIQUIS) 5 MG TABS tablet Take 1 tablet (5 mg total) by mouth 2 (two) times daily. 07/08/22  Nafziger, Kandee Keen, NP  apixaban (ELIQUIS) 5 MG TABS tablet Take 1 tablet (5 mg total) by mouth 2 (two) times daily. Patient not taking: Reported on 10/06/2022 10/02/22   Antoine Poche, MD  atorvastatin (LIPITOR) 40 MG tablet Take 1 tablet (40 mg total) by mouth daily. 07/09/22   Nafziger, Kandee Keen, NP  diphenhydrAMINE (BENADRYL) 25 MG tablet Take 25 mg by mouth daily.    [provider]  donepezil (ARICEPT) 10 MG tablet TAKE ONE TABLET BY MOUTH EVERYDAY AT BEDTIME 07/08/22   Nafziger, Kandee Keen, NP  DULoxetine  (CYMBALTA) 60 MG capsule Take 1 capsule (60 mg total) by mouth 2 (two) times daily. 10/14/22   Stasia Cavalier, MD  esomeprazole (NEXIUM) 40 MG capsule Take 1 capsule by mouth in the morning 10/14/22   Nafziger, Kandee Keen, NP  famotidine (PEPCID) 20 MG tablet Take 1 tablet (20 mg total) by mouth daily. 09/17/22   Nafziger, Kandee Keen, NP  Fluticasone-Umeclidin-Vilant (TRELEGY ELLIPTA) 100-62.5-25 MCG/ACT AEPB Inhale 1 puff into the lungs daily. 12/30/21   Glenford Bayley, NP  lamoTRIgine (LAMICTAL) 200 MG tablet Take 1 tablet (200 mg total) by mouth every evening. 05/25/22   Stasia Cavalier, MD  lurasidone (LATUDA) 40 MG TABS tablet TAKE 1 TABLET BY MOUTH ONCE DAILY AT BEDTIME 10/14/22   Stasia Cavalier, MD  metoprolol tartrate (LOPRESSOR) 50 MG tablet Take 1 tablet (50 mg total) by mouth 2 (two) times daily. 07/08/22 09/06/22  Nafziger, Kandee Keen, NP  Na Sulfate-K Sulfate-Mg Sulf 17.5-3.13-1.6 GM/177ML SOLN As directed 12/16/21   Rourk, Gerrit Friends, MD  tamsulosin (FLOMAX) 0.4 MG CAPS capsule Take 1 capsule (0.4 mg total) by mouth daily. 07/08/22   Nafziger, Kandee Keen, NP  traMADol (ULTRAM) 50 MG tablet Take 1 tablet (50 mg total) by mouth every 6 (six) hours as needed for severe pain. 07/08/22   Nafziger, Kandee Keen, NP  triamcinolone cream (KENALOG) 0.1 % Apply 1 Application topically daily as needed (rash). 07/08/22   Shirline Frees, NP    Physical Exam    Vital Signs:  Jay [name] does not have vital signs available for review today.   Given telephonic nature of communication, physical exam is limited. AAOx3. NAD. Normal affect.  Speech and respirations are unlabored.  Accessory Clinical Findings    None  Assessment & Plan    1.  Preoperative Cardiovascular Risk Assessment: According to the Revised Cardiac Risk Index (RCRI), his Perioperative Risk of Major Cardiac Event is (%): 0.4  His Functional Capacity in METs is: 6.36 according to the Duke Activity Status Index (DASI).   Per office protocol, patient can hold  Eliquis for 3 days prior to procedure as requested.  Restart as soon as possible at the discretion of surgeon when hemodynamically stable.   The patient was advised that if he develops new symptoms prior to surgery to contact our office to arrange for a follow-up visit, and he verbalized understanding.  Therefore, based on ACC/AHA guidelines, patient would be at acceptable risk for the planned procedure without further cardiovascular testing. I will route this recommendation to the requesting party via Epic fax function.   A copy of this note will be routed to requesting surgeon.   Time:   Today, I have spent 10 minutes with the patient with telehealth technology discussing medical history, symptoms, and management plan.     Joni Reining, NP  10/20/2022, 2:25 PM

## 2022-10-19 ENCOUNTER — Telehealth: Payer: Self-pay | Admitting: Adult Health

## 2022-10-19 DIAGNOSIS — I10 Essential (primary) hypertension: Secondary | ICD-10-CM

## 2022-10-19 NOTE — Telephone Encounter (Signed)
Pt wife call and want to know if Jay [name] have some sample     apixaban (ELIQUIS) 5 MG TABS tablet she stated he is almost out only have two left she stated he is in the donut hole and can't pay for it because it cost to much also want a refill on amLODipine (NORVASC) 5 MG tablet . Pt wife want a call back.

## 2022-10-20 ENCOUNTER — Ambulatory Visit: Payer: Medicare HMO | Attending: Cardiovascular Disease

## 2022-10-20 ENCOUNTER — Ambulatory Visit (HOSPITAL_COMMUNITY): Payer: Medicare HMO | Admitting: Psychiatry

## 2022-10-20 DIAGNOSIS — Z01810 Encounter for preprocedural cardiovascular examination: Secondary | ICD-10-CM | POA: Diagnosis not present

## 2022-10-20 DIAGNOSIS — Z01818 Encounter for other preprocedural examination: Secondary | ICD-10-CM

## 2022-10-20 MED ORDER — AMLODIPINE BESYLATE 5 MG PO TABS: 5.0000 mg | ORAL_TABLET | Freq: Every morning | ORAL | 1 refills | Status: DC

## 2022-10-20 NOTE — Telephone Encounter (Signed)
Pt wife is aware cory back in office today and pharm is  [id number] - [place], [place] - [address] [place] [address] Phone: [phone]  Fax: [phone]

## 2022-10-20 NOTE — Telephone Encounter (Signed)
Pt advised that samples have been placed in the front office.

## 2022-10-22 ENCOUNTER — Other Ambulatory Visit: Payer: Self-pay | Admitting: Adult Health

## 2022-11-11 ENCOUNTER — Other Ambulatory Visit (HOSPITAL_COMMUNITY): Payer: Self-pay | Admitting: Psychiatry

## 2022-11-11 ENCOUNTER — Other Ambulatory Visit: Payer: Self-pay | Admitting: Adult Health

## 2022-11-11 DIAGNOSIS — F331 Major depressive disorder, recurrent, moderate: Secondary | ICD-10-CM

## 2022-11-11 DIAGNOSIS — F411 Generalized anxiety disorder: Secondary | ICD-10-CM

## 2022-11-12 NOTE — Telephone Encounter (Signed)
Last refill. Patient needs to attend his appointment for any further reflls

## 2022-11-20 ENCOUNTER — Other Ambulatory Visit (HOSPITAL_COMMUNITY): Payer: Self-pay | Admitting: Psychiatry

## 2022-11-20 DIAGNOSIS — F331 Major depressive disorder, recurrent, moderate: Secondary | ICD-10-CM

## 2022-11-20 DIAGNOSIS — F411 Generalized anxiety disorder: Secondary | ICD-10-CM

## 2022-11-27 ENCOUNTER — Ambulatory Visit: Payer: Medicare HMO | Attending: Cardiology | Admitting: Cardiology

## 2022-11-27 ENCOUNTER — Encounter: Payer: Self-pay | Admitting: Cardiology

## 2022-11-27 VITALS — BP 122/78 | HR 80 | Ht 68.5 in | Wt 214.0 lb

## 2022-11-27 DIAGNOSIS — R42 Dizziness and giddiness: Secondary | ICD-10-CM | POA: Diagnosis not present

## 2022-11-27 DIAGNOSIS — R0789 Other chest pain: Secondary | ICD-10-CM | POA: Diagnosis not present

## 2022-11-27 DIAGNOSIS — I4891 Unspecified atrial fibrillation: Secondary | ICD-10-CM | POA: Diagnosis not present

## 2022-11-27 DIAGNOSIS — D6869 Other thrombophilia: Secondary | ICD-10-CM

## 2022-11-27 NOTE — Progress Notes (Signed)
Clinical Summary Mr. Rolnick is a 72 y.o.male seen today for follow up of the following medical problems.      1. Syncope - admit 02/2021  - was in garage, no prodrome. Next thing he remembers if getting up off the floor.  - isolated episode in the past of fall with bending over - CT showed small ICH managed conservatively.    05/2021 30 day monitor: afib avg rate 88.   - some dizziness/lightheadnesss at times, only when standing - drinks coffee 5 cups a day, rarely drinks water    2. Afib - new diagnosis 05/02/21 - from ER note they discussed with neurosurgery anticoag, was cleared to start - toprol started during recent ER visit with chest pain, rates were elevated at the time.      - occasiional fluttering, about twice a week. Just a few seconds.  - compliant with meds. No bleeding on eliquis.      3. Carotid US 03/2021 RICA 50-69%, LICA 1-49%. BP discrepnacy right side suggesting right inonimate artery or subclavian stenosis, vertebral flow was normal  05/2022 carotid US: modearate plaque without significant stenosis.    4. BP difference arms - reported during carotid study - from vitals today bp's are similar between arms.      5.Chest pain - ER visit 08/18/21 with chest pain. No evidence of ACS by ekg or enzymes - afib rates were elevated to 120s, started on toprol   - started 2-3 weeks prior - pressure midchest, 7-8/10 in severity. At rest or with exertion. +SOB, fatigued. Not positional. Would last few hours, constant.  - no palpitation - still with some episodes.    08/2021 nuclear stress: no ischemia. LVEF 45% 11/2021 echo: LVEF 55-60%   - some chest pains at times - about once a week. Pressure midchest, 5/10 in severity. Can occur at rest or with activity. +SOB, hot, sweaty. Not positional. Lasts few minutes. Some wheezing.    - some chest discomforts at time, right sided. Worst with deep breathing.      6. COPD - followed by pulmonary  7. HLD -  06/2022 TC 183 TG 74 HDL 53 LDL 115 - based on this panel pcp increased atovastatin to 40mg  daily.    Past Medical History:  Diagnosis Date   Anxiety    Arthritis    COPD (chronic obstructive pulmonary disease) (HCC)    Depression    Dysrhythmia    GERD (gastroesophageal reflux disease)    Hypertension    PAF (paroxysmal atrial fibrillation) (HCC)    Shortness of breath    with excertion     Allergies  Allergen Reactions   Bupropion     Hyper, nervous and couldn't sleep     Current Outpatient Medications  Medication Sig Dispense Refill   acetaminophen (TYLENOL) 500 MG tablet Take 1,000 mg by mouth every 6 (six) hours as needed for moderate pain.     albuterol (VENTOLIN HFA) 108 (90 Base) MCG/ACT inhaler Inhale 2 puffs into the lungs every 4 (four) hours as needed for wheezing or shortness of breath.     amLODipine (NORVASC) 5 MG tablet Take 1 tablet (5 mg total) by mouth every morning. 90 tablet 1   apixaban (ELIQUIS) 5 MG TABS tablet Take 1 tablet (5 mg total) by mouth 2 (two) times daily. 180 tablet 0   apixaban (ELIQUIS) 5 MG TABS tablet Take 1 tablet (5 mg total) by mouth 2 (two) times daily. (Patient  not taking: Reported on 10/06/2022) 28 tablet 0   atorvastatin (LIPITOR) 40 MG tablet Take 1 tablet (40 mg total) by mouth daily. 90 tablet 3   diphenhydrAMINE (BENADRYL) 25 MG tablet Take 25 mg by mouth daily.     donepezil (ARICEPT) 10 MG tablet TAKE ONE TABLET BY MOUTH EVERYDAY AT BEDTIME 90 tablet 3   DULoxetine (CYMBALTA) 60 MG capsule Take 1 capsule by mouth twice daily 60 capsule 0   esomeprazole (NEXIUM) 40 MG capsule Take 1 capsule by mouth in the morning 30 capsule 0   famotidine (PEPCID) 20 MG tablet Take 1 tablet by mouth once daily 90 tablet 1   Fluticasone-Umeclidin-Vilant (TRELEGY ELLIPTA) 100-62.5-25 MCG/ACT AEPB Inhale 1 puff into the lungs daily. 60 each 5   lamoTRIgine (LAMICTAL) 200 MG tablet Take 1 tablet (200 mg total) by mouth every evening. 90 tablet 1    lurasidone (LATUDA) 40 MG TABS tablet TAKE 1 TABLET BY MOUTH ONCE DAILY AT BEDTIME 30 tablet 0   metoprolol tartrate (LOPRESSOR) 50 MG tablet Take 1 tablet (50 mg total) by mouth 2 (two) times daily. 90 tablet 3   Na Sulfate-K Sulfate-Mg Sulf 17.5-3.13-1.6 GM/177ML SOLN As directed 354 mL 0   tamsulosin (FLOMAX) 0.4 MG CAPS capsule Take 1 capsule (0.4 mg total) by mouth daily. 90 capsule 3   traMADol (ULTRAM) 50 MG tablet Take 1 tablet (50 mg total) by mouth every 6 (six) hours as needed for severe pain. 30 tablet 2   triamcinolone cream (KENALOG) 0.1 % Apply 1 Application topically daily as needed (rash). 30 g 2   No current facility-administered medications for this visit.     Past Surgical History:  Procedure Laterality Date   APPENDECTOMY     COLONOSCOPY WITH PROPOFOL N/A 03/25/2022   Procedure: COLONOSCOPY WITH PROPOFOL;  Surgeon: Corbin Ade, MD;  [facility];  Service: Endoscopy;  Laterality: N/A;  9:00 am   EYE SURGERY     POLYPECTOMY  03/25/2022   Procedure: POLYPECTOMY INTESTINAL;  Surgeon: Corbin Ade, MD;  [facility];  Service: Endoscopy;;     Allergies  Allergen Reactions   Bupropion     Hyper, nervous and couldn't sleep      Family History  Problem Relation Age of Onset   Hypertension Mother    Cancer Father    Hypertension Father    Hypertension Sister    Cancer Brother    Hypertension Brother    Cancer - Colon Neg Hx    Colon polyps Neg Hx      Social History Mr. Mutti reports that he quit smoking about 29 years ago. His smoking use included cigarettes. He has never used smokeless tobacco. Mr. Mclafferty reports no history of alcohol use.   Review of Systems CONSTITUTIONAL: No weight loss, fever, chills, weakness or fatigue.  HEENT: Eyes: No visual loss, blurred vision, double vision or yellow sclerae.No hearing loss, sneezing, congestion, runny nose or sore throat.  SKIN: No rash or itching.  CARDIOVASCULAR: per  hpi RESPIRATORY: per hpi GASTROINTESTINAL: No anorexia, nausea, vomiting or diarrhea. No abdominal pain or blood.  GENITOURINARY: No burning on urination, no polyuria NEUROLOGICAL: No headache, dizziness, syncope, paralysis, ataxia, numbness or tingling in the extremities. No change in bowel or bladder control.  MUSCULOSKELETAL: No muscle, back pain, joint pain or stiffness.  LYMPHATICS: No enlarged nodes. No history of splenectomy.  PSYCHIATRIC: No history of depression or anxiety.  ENDOCRINOLOGIC: No reports of sweating, cold or  heat intolerance. No polyuria or polydipsia.  Marland Kitchen   Physical Examination Today's Vitals   11/27/22 1519  BP: 122/78  Pulse: 80  SpO2: 97%  Weight: 214 lb (97.1 kg)  Height: 5' 8.5" (1.74 m)   Body mass index is 32.07 kg/m.  Gen: resting comfortably, no acute distress HEENT: no scleral icterus, pupils equal round and reactive, no palptable cervical adenopathy,  CV: RRR, no m/rg, no jvd Resp: Clear to auscultation bilaterally GI: abdomen is soft, non-tender, non-distended, normal bowel sounds, no hepatosplenomegaly MSK: extremities are warm, no edema.  Skin: warm, no rash Neuro:  no focal deficits Psych: appropriate affect   Diagnostic Studies  12/2020 monitor 2 day monitor Rare supraventricular ectopy in the form of isolated PACs, couplets, triplets. Isolated 5 beat run of SVT. Frequent ventricular ectopy in the form of isolated PVCs. Rare couplets. No symptoms reported     02/2021 echo   1. Left ventricular ejection fraction, by estimation, is 55 to 60%. The  left ventricle has normal function. The left ventricle has no regional  wall motion abnormalities. Left ventricular diastolic parameters are  consistent with Grade I diastolic  dysfunction (impaired relaxation).   2. Right ventricular systolic function is normal. The right ventricular  size is mildly enlarged.   3. Left atrial size was mildly dilated.   4. The mitral valve is normal in  structure. Trivial mitral valve  regurgitation. No evidence of mitral stenosis.   5. The aortic valve is normal in structure. Aortic valve regurgitation is  not visualized. No aortic stenosis is present.   6. The inferior vena cava is normal in size with greater than 50%  respiratory variability, suggesting right atrial pressure of 3 mmHg.      05/2021 monitor 30 day monitor Min HR 62, Max HR 171, Avg HR 88 Tele tracings show afib variable rates. One episode of symptoms correlated with afib rate 100 isolated PVC     08/2021 nuclear stress   The study is normal, there are no perfusion defects consistent with prior infarct or current ischemia. The study is intermediate risk purely based on LVEF 45%, consider echo to better evaluate LV function   No ST deviation was noted.   LV perfusion is normal.   Left ventricular function is abnormal. Nuclear stress EF: 45 %. The left ventricular ejection fraction is mildly decreased (45-54%). End diastolic cavity size is normal.   Assessment and Plan   1.Dizziness - poor oral hydration, orthostatic in clinic with SBP dropping 31 points lying to sitting and DBP dropping 11 points. Drinks almost exclusively coffee. Encouraged better oral hydration water, juices, lower calorie sports drinks.    2.Chest pain - negative stress test recently, no evidence symptoms are cardiac in etiology - chronic atypical symptoms, no further testing indicated.    3. Afib/acquired thrombophilia -no recent symptoms, continue current meds   4. Carotid stenosis - most recent US moderate plaque without significant stenosis, continue to monitor.    F/u 6 months  Antoine Poche, M.D.

## 2022-11-27 NOTE — Patient Instructions (Signed)

## 2022-12-07 NOTE — Progress Notes (Unsigned)
BH MD/PA/NP OP Progress Note  12/08/2022 5:02 PM Jay [name]  MRN:  [id number]  Visit Diagnosis:    ICD-10-CM   1. GAD (generalized anxiety disorder)  F41.1 lamoTRIgine (LAMICTAL) 100 MG tablet    DULoxetine (CYMBALTA) 60 MG capsule    lurasidone (LATUDA) 40 MG TABS tablet    2. Moderate episode of recurrent major depressive disorder (HCC)  F33.1 lamoTRIgine (LAMICTAL) 100 MG tablet    DULoxetine (CYMBALTA) 60 MG capsule    lurasidone (LATUDA) 40 MG TABS tablet     Assessment: Jay [name] is a 72 y.o. male with a history of MDD, GAD, COPD, HTN,  who presented to [facility] for initial evaluation on 04/13/2022.    At initial evaluation patient reported neurovegetative symptoms of depression including low mood, anhedonia, amotivation, poor sleep, increased appetite, excessive tearfulness, fatigue, poor concentration, and intermittent passive SI.  He denied any passive SI at this time reporting that there is only been 2 episodes since 2022.  He denied any intent or plan since around 2014.  Safety planning and crisis resources were discussed. In addition to depression patient also reported significant anxiety.  He endorsed symptoms of constant worry that he is unable to control, difficulty relaxing, restlessness, increased irritability, and fear of something awful happening.  Patient did endorse having episodes of panic where he is short of breath, restless, diaphoretic, and can get muscle rigidity.  These were more frequent while on Wellbutrin though still occur occasionally since discontinuing.  Patient met criteria for MDD and GAD.  Jay [name] presents for follow-up evaluation. Today, 12/08/22, patient reports a steady decline in mood since his last appointment.  He endorses symptoms of amotivation, anhedonia, disturbed sleep pattern, decreased appetite, and fatigue.  He denies any SI or thoughts of self-harm.  Been taking his medication consistently  denies any adverse side effects.  Patient is uncertain of any particular benefit from the medication and thus we decided to trial a taper down on Lamictal.  We will also restart Wellbutrin which had been on the past with some potential adverse side effects.  Of note patient reports there are other things going on that as well that could have contributed to the hyperactivity experienced with Wellbutrin in the past.  Risk and benefits of both medications were discussed and patient will follow up in a month.  Plan: - Continue Cymbalta 60 mg BID for depressed mood/anxiety - Taper Lamictal to 150 mg for 7 days before decreasing to 100 mg daily - Continue Latuda 40 mg w/dinner for depressed mood - Start Wellbutrin XL 150 mg daily - Continue Donepezil 10 mg QD, managed by PCP - CMP and CBC reviewed - Therapy referral - Crisis resources reviewed - Follow up in a month  Chief Complaint:  Chief Complaint  Patient presents with   Follow-up   HPI: Jay [name] presents reporting that the past 6 months things started to go downhill.  He has found that the anhedonia and fatigue have gradually gotten worse and now he has no motivation to complete anything.  This is despite having a number of tasks that need to be done around the house.  He denies any particular trigger for the study decline including the weather.  Jay [name] notes that he had been feeling this way prior to the onset of fall.  Patient has had less mood swings and irritability since he first started seeing this provider which she attributes to the duloxetine.  Also of  note patient reports that his son is talking to him again while they will not eat in person Adem has been happy that his son has reached out by telephone.  Patient's wife was brought in the session later and confirmed Jay [name]'s report.  We discussed plan going forward as well as current medication regimen.  Of note he was on Lamictal 200 mg prior to connecting with this provider.  We  questioned whether either Annia Friendly or his wife had noticed any changes when this medication was titrated or feel that it had been doing anything.  We discussed trialing a taper taper on this medication as it could potentially cause some mild sedation.  We will monitor for any change in irritability or mood lability symptoms.  Also discussed a retrial of bupropion.  Of note patient has been on this medication twice in the past once 6 years ago and again a year and a half ago.  6 years ago there have been no apparent side effects however a year and a half ago he had experienced hyperactivity.  As he is struggling with motivation and fatigue we discussed this and patient was open to a retrial of bupropion.  He notes that there were other things going on in his last trial which might have also contributed to the hyperactivity.  Risk and benefits of retrying this medication were discussed.  Past Psychiatric History: Had seen a psychiatrist 2009, no prior psychiatric hospitalizations, no prior suicide attempts.  He denies ever being connected with a therapist.  Has tried Wellbutrin, Xanax, Restoril in the past  Denies current substance. Alcohol and marijuana 20-30 years ago.  Past Medical History:  Past Medical History:  Diagnosis Date   Anxiety    Arthritis    COPD (chronic obstructive pulmonary disease) (HCC)    Depression    Dysrhythmia    GERD (gastroesophageal reflux disease)    Hypertension    PAF (paroxysmal atrial fibrillation) (HCC)    Shortness of breath    with excertion    Past Surgical History:  Procedure Laterality Date   APPENDECTOMY     COLONOSCOPY WITH PROPOFOL N/A 03/25/2022   Procedure: COLONOSCOPY WITH PROPOFOL;  Surgeon: Corbin Ade, MD;  Location: [facility];  Service: Endoscopy;  Laterality: N/A;  9:00 am   EYE SURGERY     POLYPECTOMY  03/25/2022   Procedure: POLYPECTOMY INTESTINAL;  Surgeon: Corbin Ade, MD;  Location: [facility];  Service: Endoscopy;;     Family History:  Family History  Problem Relation Age of Onset   Hypertension Mother    Cancer Father    Hypertension Father    Hypertension Sister    Cancer Brother    Hypertension Brother    Cancer - Colon Neg Hx    Colon polyps Neg Hx     Social History:  Social History   Socioeconomic History   Marital status: Married    Spouse name: Not on file   Number of children: Not on file   Years of education: Not on file   Highest education level: Not on file  Occupational History   Not on file  Tobacco Use   Smoking status: Former    Current packs/day: 0.00    Types: Cigarettes    Quit date: 01/12/1993    Years since quitting: 29.9   Smokeless tobacco: Never  Vaping Use   Vaping status: Never Used  Substance and Sexual Activity   Alcohol use: No    Comment:  Quit 1995   Drug use: No   Sexual activity: Not on file  Other Topics Concern   Not on file  Social History Narrative   Not on file   Social Determinants of Health   Financial Resource Strain: Low Risk  (07/03/2022)   Overall Financial Resource Strain (CARDIA)    Difficulty of Paying Living Expenses: Not hard at all  Food Insecurity: No Food Insecurity (07/03/2022)   Hunger Vital Sign    Worried About Running Out of Food in the Last Year: Never true    Ran Out of Food in the Last Year: Never true  Transportation Needs: No Transportation Needs (07/03/2022)   PRAPARE - Administrator, Civil Service (Medical): No    Lack of Transportation (Non-Medical): No  Physical Activity: Inactive (07/03/2022)   Exercise Vital Sign    Days of Exercise per Week: 0 days    Minutes of Exercise per Session: 0 min  Stress: No Stress Concern Present (07/03/2022)   [employer] of Occupational Health - Occupational Stress Questionnaire    Feeling of Stress : Not at all  Social Connections: Socially Integrated (07/03/2022)   Social Connection and Isolation Panel [NHANES]    Frequency of Communication with Friends  and Family: More than three times a week    Frequency of Social Gatherings with Friends and Family: More than three times a week    Attends Religious Services: More than 4 times per year    Active Member of [employer] or Organizations: Yes    Attends Engineer, structural: More than 4 times per year    Marital Status: Married    Allergies:  Allergies  Allergen Reactions   Bupropion     Hyper, nervous and couldn't sleep    Current Medications: Current Outpatient Medications  Medication Sig Dispense Refill   acetaminophen (TYLENOL) 500 MG tablet Take 1,000 mg by mouth every 6 (six) hours as needed for moderate pain.     albuterol (VENTOLIN HFA) 108 (90 Base) MCG/ACT inhaler Inhale 2 puffs into the lungs every 4 (four) hours as needed for wheezing or shortness of breath.     amLODipine (NORVASC) 5 MG tablet Take 1 tablet (5 mg total) by mouth every morning. 90 tablet 1   apixaban (ELIQUIS) 5 MG TABS tablet Take 1 tablet (5 mg total) by mouth 2 (two) times daily. 180 tablet 0   apixaban (ELIQUIS) 5 MG TABS tablet Take 1 tablet (5 mg total) by mouth 2 (two) times daily. 28 tablet 0   atorvastatin (LIPITOR) 40 MG tablet Take 1 tablet (40 mg total) by mouth daily. 90 tablet 3   diphenhydrAMINE (BENADRYL) 25 MG tablet Take 25 mg by mouth daily.     donepezil (ARICEPT) 10 MG tablet TAKE ONE TABLET BY MOUTH EVERYDAY AT BEDTIME 90 tablet 3   esomeprazole (NEXIUM) 40 MG capsule Take 1 capsule by mouth in the morning 30 capsule 0   famotidine (PEPCID) 20 MG tablet Take 1 tablet by mouth once daily 90 tablet 1   Fluticasone-Umeclidin-Vilant (TRELEGY ELLIPTA) 100-62.5-25 MCG/ACT AEPB Inhale 1 puff into the lungs daily. 60 each 5   Na Sulfate-K Sulfate-Mg Sulf 17.5-3.13-1.6 GM/177ML SOLN As directed 354 mL 0   tamsulosin (FLOMAX) 0.4 MG CAPS capsule Take 1 capsule (0.4 mg total) by mouth daily. 90 capsule 3   traMADol (ULTRAM) 50 MG tablet Take 1 tablet (50 mg total) by mouth every 6 (six) hours as  needed for severe pain. 30  tablet 2   triamcinolone cream (KENALOG) 0.1 % Apply 1 Application topically daily as needed (rash). 30 g 2   buPROPion (WELLBUTRIN XL) 150 MG 24 hr tablet Take 1 tablet (150 mg total) by mouth every morning. 30 tablet 2   DULoxetine (CYMBALTA) 60 MG capsule Take 1 capsule (60 mg total) by mouth 2 (two) times daily. 180 capsule 0   lamoTRIgine (LAMICTAL) 100 MG tablet Take 1.5 tablets (150 mg total) by mouth every evening for 7 days, THEN 1 tablet (100 mg total) every evening for 23 days. 46 tablet 0   lurasidone (LATUDA) 40 MG TABS tablet Take 1 tablet (40 mg total) by mouth at bedtime. 90 tablet 0   metoprolol tartrate (LOPRESSOR) 50 MG tablet Take 1 tablet (50 mg total) by mouth 2 (two) times daily. 90 tablet 3   No current facility-administered medications for this visit.     Musculoskeletal: Strength & Muscle Tone: within normal limits Gait & Station: normal Patient leans: N/A  Psychiatric Specialty Exam: Review of Systems  Blood pressure 118/81, pulse 80, height 5' 8.5" (1.74 m), weight 212 lb (96.2 kg).Body mass index is 31.77 kg/m.  General Appearance: Fairly Groomed  Eye Contact:  Good  Speech:  Clear and Coherent and Normal Rate  Volume:  Normal  Mood:  Depressed  Affect:  Appropriate  Thought Process:  Coherent  Orientation:  Full (Time, Place, and Person)  Thought Content: Logical and Rumination   Suicidal Thoughts:  No  Homicidal Thoughts:  No  Memory:  Immediate;   Good  Judgement:  Fair  Insight:  Fair  Psychomotor Activity:  Normal  Concentration:  Concentration: Fair  Recall:  Poor  Fund of Knowledge: Fair  Language: Good  Akathisia:  No    AIMS (if indicated): done  Assets:  Communication Skills Desire for Improvement Social Support Transportation  ADL's:  Intact  Cognition: WNL  Sleep:  Good   Metabolic Disorder Labs: Lab Results  Component Value Date   HGBA1C 6.1 07/08/2022   No results found for: "PROLACTIN" Lab  Results  Component Value Date   CHOL 183 07/08/2022   TRIG 74.0 07/08/2022   HDL 53.00 07/08/2022   CHOLHDL 3 07/08/2022   VLDL 14.8 07/08/2022   LDLCALC 115 (H) 07/08/2022   Lab Results  Component Value Date   TSH 2.77 07/08/2022   TSH 1.821 05/02/2021    Therapeutic Level Labs: No results found for: "LITHIUM" No results found for: "VALPROATE" No results found for: "CBMZ"   Screenings: GAD-7    Flowsheet Row Office Visit from 07/08/2022 in [facility] [place] Office Visit from [facility]/13/2022 [facility]  Total GAD-7 Score 13 20      PHQ2-9    Flowsheet Row Office Visit from 07/08/2022 in [facility] [place] Clinical Support from 07/03/2022 in [facility] [place] Office Visit from 04[facility]13/2022 in [facility] Erroneous Encounter from 04/01/2022 in [facility] [place] Office Visit from 11/04/2021 in [facility] [place]  PHQ-2 Total Score 6 0 6 2 6   PHQ-9 Total Score 21 -- 22 6 19       Flowsheet Row Office Visit from 04/13/2022 in [facility] Admission (Discharged) from 03/25/2022 in [facility] Pre-Admission Testing 60 from 03/23/2022 in [place] [facility]  C-SSRS RISK CATEGORY Low Risk No Risk No Risk  Collaboration of Care: Collaboration of Care: Medication Management AEB medication prescription, Primary Care Provider AEB chart review, and Other provider involved in patient's care AEB cardiology chart review  Patient/Guardian was advised Release of Information must be obtained prior to any record release in order to collaborate their care with an outside provider. Patient/Guardian was advised if they have not already done so to contact the registration department to sign all necessary forms in order for [place] to release  information regarding their care.   Consent: Patient/Guardian gives verbal consent for treatment and assignment of benefits for services provided during this visit. Patient/Guardian expressed understanding and agreed to proceed.    Stasia Cavalier, MD 12/08/2022, 5:02 PM

## 2022-12-08 ENCOUNTER — Telehealth: Payer: Self-pay | Admitting: Cardiology

## 2022-12-08 ENCOUNTER — Ambulatory Visit (HOSPITAL_BASED_OUTPATIENT_CLINIC_OR_DEPARTMENT_OTHER): Payer: Medicare HMO | Admitting: Psychiatry

## 2022-12-08 ENCOUNTER — Encounter (HOSPITAL_COMMUNITY): Payer: Self-pay | Admitting: Psychiatry

## 2022-12-08 ENCOUNTER — Other Ambulatory Visit: Payer: Self-pay

## 2022-12-08 VITALS — BP 118/81 | HR 80 | Ht 68.5 in | Wt 212.0 lb

## 2022-12-08 DIAGNOSIS — F411 Generalized anxiety disorder: Secondary | ICD-10-CM

## 2022-12-08 DIAGNOSIS — F331 Major depressive disorder, recurrent, moderate: Secondary | ICD-10-CM | POA: Diagnosis not present

## 2022-12-08 MED ORDER — BUPROPION HCL ER (XL) 150 MG PO TB24: 150.0000 mg | ORAL_TABLET | ORAL | 2 refills | Status: DC

## 2022-12-08 MED ORDER — LURASIDONE HCL 40 MG PO TABS: 40.0000 mg | ORAL_TABLET | Freq: Every day | ORAL | 0 refills | Status: DC

## 2022-12-08 MED ORDER — DULOXETINE HCL 60 MG PO CPEP: 60.0000 mg | ORAL_CAPSULE | Freq: Two times a day (BID) | ORAL | 0 refills | Status: DC

## 2022-12-08 MED ORDER — LAMOTRIGINE 100 MG PO TABS: ORAL_TABLET | ORAL | 0 refills | Status: DC

## 2022-12-08 MED ORDER — APIXABAN 5 MG PO TABS: 5.0000 mg | ORAL_TABLET | Freq: Two times a day (BID) | ORAL | 0 refills | Status: DC

## 2022-12-08 NOTE — Telephone Encounter (Signed)
Spoke to patient who stated that at this time, he is unable to afford Eliquis from his pharmacy. Pt was unsure of copay- stated his wife takes care of that and is not home at the moment. Pt is agreeable to filling out PAF for BMS.  Pt notified Eliquis samples are at the front desk awaiting pick up.

## 2022-12-08 NOTE — Telephone Encounter (Signed)
Patient calling the office for samples of medication:   1.  What medication and dosage are you requesting samples for? apixaban (ELIQUIS) 5 MG TABS tablet  2.  Are you currently out of this medication? Yes

## 2022-12-08 NOTE — Telephone Encounter (Signed)
Sample request for Eliquis received. Indication: AF Last office visit: 11/27/22  Dominga Ferry MD Scr: 1.32 on 07/08/22  Epic Age: 72 Weight: 97.1kg  Based on above findings Eliquis 5mg  twice daily is the appropriate dose.  OK to provide samples if available.

## 2022-12-09 ENCOUNTER — Encounter (HOSPITAL_COMMUNITY): Payer: Self-pay | Admitting: Psychiatry

## 2022-12-17 ENCOUNTER — Telehealth: Payer: Self-pay | Admitting: Adult Health

## 2022-12-17 NOTE — Telephone Encounter (Signed)
Patient was with his wife and reported he needed samples of eliquis due to being in the donut hole   Samples of Eliquis were given to the patient, quantity 6, Lot Number [id number]

## 2022-12-18 ENCOUNTER — Other Ambulatory Visit: Payer: Self-pay | Admitting: Adult Health

## 2022-12-30 ENCOUNTER — Other Ambulatory Visit: Payer: Self-pay

## 2022-12-30 DIAGNOSIS — I4811 Longstanding persistent atrial fibrillation: Secondary | ICD-10-CM

## 2022-12-30 DIAGNOSIS — I10 Essential (primary) hypertension: Secondary | ICD-10-CM

## 2022-12-30 MED ORDER — METOPROLOL TARTRATE 50 MG PO TABS: 50.0000 mg | ORAL_TABLET | Freq: Two times a day (BID) | ORAL | 3 refills | Status: DC

## 2023-01-07 NOTE — Progress Notes (Signed)
Subjective:    Patient ID: Jay [name], male    DOB: [birth year], 72 y.o.   MRN: [id number]  HPI  72 year old male who  has a past medical history of Anxiety, Arthritis, COPD (chronic obstructive pulmonary disease) (HCC), Depression, Dysrhythmia, GERD (gastroesophageal reflux disease), Hypertension, PAF (paroxysmal atrial fibrillation) (HCC), and Shortness of breath.  He presents to the office today for six month follow up regarding multiple issues.   Prediabetes - during his CPE six month ago it was noted that his A1c showed prediabetes. He was advised to work on lifestyle modifications. He reports that he has cut back on sugars but still enjoys his sweets. His weight has increased about 13 pounds.   Lab Results  Component Value Date   HGBA1C 6.1 07/08/2022   Hyperlipidemia - during his last CPE we also increased his Lipitor from 20 mg to 40 mg   BPH - he was started on Flomax but stopped taking this after 2-3 weeks as he did not see a difference.  He continues to have decreased stream, nocturia getting up 2-3 times a night, urinary incontinence, and incomplete bladder emptying.  Cognitive Impairment -has been on Aricept for a number of years.  His wife reports that over the last few months she has noticed that the patient will be sitting on the couch just staring off into space not saying anything.  At times he seems out of it.  Does not appear to have any further confusion.  He is no longer driving.  Gait instability -he reports that over the last 6 months or so he has been having worsening gait issues.  He staggers when he walks and feels as though his legs are giving out on him.  He feels weak and unable to get out of chairs or bed easily.  He denies any pain.  He has not had a fall recently but has fallen because of this.  Fall did not cause injury.  Finally, he reports that "my nuts itch".  This has been going on for couple months.  He stopped wearing underwear in the hopes that  this would help and it has not.  He is also been applying Vaseline and alcohol which he reports stopped the itch for "a few minutes" and then the itch comes back.  He has also been using an over-the-counter spray for jock itch and he reports that this burns when he applies it.  The been no changes in his detergents soaps, or lotions.    Review of Systems See HPI   Past Medical History:  Diagnosis Date   Anxiety    Arthritis    COPD (chronic obstructive pulmonary disease) (HCC)    Depression    Dysrhythmia    GERD (gastroesophageal reflux disease)    Hypertension    PAF (paroxysmal atrial fibrillation) (HCC)    Shortness of breath    with excertion    Social History   Socioeconomic History   Marital status: Married    Spouse name: Not on file   Number of children: Not on file   Years of education: Not on file   Highest education level: Not on file  Occupational History   Not on file  Tobacco Use   Smoking status: Former    Current packs/day: 0.00    Types: Cigarettes    Quit date: 01/12/1993    Years since quitting: 30.0   Smokeless tobacco: Never  Vaping Use   Vaping status: Never  Used  Substance and Sexual Activity   Alcohol use: No    Comment: Quit 1995   Drug use: No   Sexual activity: Not on file  Other Topics Concern   Not on file  Social History Narrative   Not on file   Social Drivers of Health   Financial Resource Strain: Low Risk  (07/03/2022)   Overall Financial Resource Strain (CARDIA)    Difficulty of Paying Living Expenses: Not hard at all  Food Insecurity: No Food Insecurity (07/03/2022)   Hunger Vital Sign    Worried About Running Out of Food in the Last Year: Never true    Ran Out of Food in the Last Year: Never true  Transportation Needs: No Transportation Needs (07/03/2022)   PRAPARE - Administrator, Civil Service (Medical): No    Lack of Transportation (Non-Medical): No  Physical Activity: Inactive (07/03/2022)   Exercise Vital  Sign    Days of Exercise per Week: 0 days    Minutes of Exercise per Session: 0 min  Stress: No Stress Concern Present (07/03/2022)   [employer] of Occupational Health - Occupational Stress Questionnaire    Feeling of Stress : Not at all  Social Connections: Socially Integrated (07/03/2022)   Social Connection and Isolation Panel [NHANES]    Frequency of Communication with Friends and Family: More than three times a week    Frequency of Social Gatherings with Friends and Family: More than three times a week    Attends Religious Services: More than 4 times per year    Active Member of Clubs or Organizations: Yes    Attends Banker Meetings: More than 4 times per year    Marital Status: Married  Catering manager Violence: Not At Risk (07/03/2022)   Humiliation, Afraid, Rape, and Kick questionnaire    Fear of Current or Ex-Partner: No    Emotionally Abused: No    Physically Abused: No    Sexually Abused: No    Past Surgical History:  Procedure Laterality Date   APPENDECTOMY     COLONOSCOPY WITH PROPOFOL N/A 03/25/2022   Procedure: COLONOSCOPY WITH PROPOFOL;  Surgeon: Corbin Ade, MD;  Location: [facility];  Service: Endoscopy;  Laterality: N/A;  9:00 am   EYE SURGERY     POLYPECTOMY  03/25/2022   Procedure: POLYPECTOMY INTESTINAL;  Surgeon: Corbin Ade, MD;  Location: [facility];  Service: Endoscopy;;    Family History  Problem Relation Age of Onset   Hypertension Mother    Cancer Father    Hypertension Father    Hypertension Sister    Cancer Brother    Hypertension Brother    Cancer - Colon Neg Hx    Colon polyps Neg Hx     Allergies  Allergen Reactions   Bupropion     Hyper, nervous and couldn't sleep    Current Outpatient Medications on File Prior to Visit  Medication Sig Dispense Refill   acetaminophen (TYLENOL) 500 MG tablet Take 1,000 mg by mouth every 6 (six) hours as needed for moderate pain.     albuterol (VENTOLIN HFA) 108 (90  Base) MCG/ACT inhaler Inhale 2 puffs into the lungs every 4 (four) hours as needed for wheezing or shortness of breath.     amLODipine (NORVASC) 5 MG tablet Take 1 tablet (5 mg total) by mouth every morning. 90 tablet 1   apixaban (ELIQUIS) 5 MG TABS tablet Take 1 tablet (5 mg total) by mouth 2 (  two) times daily. 180 tablet 0   apixaban (ELIQUIS) 5 MG TABS tablet Take 1 tablet (5 mg total) by mouth 2 (two) times daily. 28 tablet 0   atorvastatin (LIPITOR) 40 MG tablet Take 1 tablet (40 mg total) by mouth daily. 90 tablet 3   buPROPion (WELLBUTRIN XL) 150 MG 24 hr tablet Take 1 tablet (150 mg total) by mouth every morning. 30 tablet 2   diphenhydrAMINE (BENADRYL) 25 MG tablet Take 25 mg by mouth daily.     donepezil (ARICEPT) 10 MG tablet TAKE ONE TABLET BY MOUTH EVERYDAY AT BEDTIME 90 tablet 3   DULoxetine (CYMBALTA) 60 MG capsule Take 1 capsule (60 mg total) by mouth 2 (two) times daily. 180 capsule 0   esomeprazole (NEXIUM) 40 MG capsule Take 1 capsule by mouth in the morning 90 capsule 3   famotidine (PEPCID) 20 MG tablet Take 1 tablet by mouth once daily 90 tablet 1   Fluticasone-Umeclidin-Vilant (TRELEGY ELLIPTA) 100-62.5-25 MCG/ACT AEPB Inhale 1 puff into the lungs daily. 60 each 5   lurasidone (LATUDA) 40 MG TABS tablet Take 1 tablet (40 mg total) by mouth at bedtime. 90 tablet 0   metoprolol tartrate (LOPRESSOR) 50 MG tablet Take 1 tablet (50 mg total) by mouth 2 (two) times daily. 90 tablet 3   Na Sulfate-K Sulfate-Mg Sulf 17.5-3.13-1.6 GM/177ML SOLN As directed 354 mL 0   tamsulosin (FLOMAX) 0.4 MG CAPS capsule Take 1 capsule (0.4 mg total) by mouth daily. 90 capsule 3   traMADol (ULTRAM) 50 MG tablet Take 1 tablet (50 mg total) by mouth every 6 (six) hours as needed for severe pain. 30 tablet 2   triamcinolone cream (KENALOG) 0.1 % Apply 1 Application topically daily as needed (rash). 30 g 2   lamoTRIgine (LAMICTAL) 100 MG tablet Take 1.5 tablets (150 mg total) by mouth every evening for  7 days, THEN 1 tablet (100 mg total) every evening for 23 days. 46 tablet 0   No current facility-administered medications on file prior to visit.    BP 100/60   Pulse 98   Temp (!) 97.5 F (36.4 C) (Oral)   Ht 5' (1.524 m)   Wt 213 lb (96.6 kg)   SpO2 98%   BMI 41.60 kg/m       Objective:   Physical Exam Vitals and nursing note reviewed.  Constitutional:      Appearance: Normal appearance. He is obese.  Cardiovascular:     Rate and Rhythm: Normal rate and regular rhythm.     Pulses: Normal pulses.     Heart sounds: Normal heart sounds.  Pulmonary:     Effort: Pulmonary effort is normal.     Breath sounds: Normal breath sounds.  Genitourinary:    Penis: Normal. No erythema or swelling.      Comments: He has a red flat rash noted on his scrotum and inguinal folds. Musculoskeletal:        General: Normal range of motion.  Skin:    General: Skin is warm and dry.  Neurological:     General: No focal deficit present.     Mental Status: He is alert and oriented to person, place, and time.  Psychiatric:        Mood and Affect: Mood normal.        Behavior: Behavior normal.        Thought Content: Thought content normal.        Judgment: Judgment normal.  Assessment & Plan:  1. Prediabetes (Primary)  - POC HgB A1c- 5.7 - has improved   2. Hyperlipidemia, unspecified hyperlipidemia type  - Hepatic function panel; Future - Lipid panel; Future - Lipid panel - Hepatic function panel  3. Gait instability  - Ambulatory referral to Physical Therapy  4. Benign prostatic hyperplasia with nocturia - Will trial him on Proscar. Advised it can take up to 4-6 months for this to work  - Follow up with at CPE  - finasteride (PROSCAR) 5 MG tablet; Take 1 tablet (5 mg total) by mouth daily.  Dispense: 90 tablet; Refill: 1  5. Cognitive impairment - Will start on Namenda Titration pack for slightly worse cognitive impairment  - We did not have time to do an MMSE  today  - memantine (NAMENDA TITRATION PAK) tablet pack; 5 mg/day for =1 week; 5 mg twice daily for =1 week; 15 mg/day given in 5 mg and 10 mg separated doses for =1 week; then 10 mg twice daily  Dispense: 49 tablet; Refill: 12  6. Tinea cruris - Follow up if not resolved in the next few weeks  - clotrimazole-betamethasone (LOTRISONE) cream; Apply 1 Application topically 2 (two) times daily.  Dispense: 60 g; Refill: 1  Shirline Frees, NP  Time spent with patient today was 40 minutes which consisted of chart review, discussing pre diabetes, hyperlipidemia, gait instability, BPH, cognitive impairment and Tinea infection , work up, treatment answering questions and documentation.

## 2023-01-08 ENCOUNTER — Encounter: Payer: Self-pay | Admitting: Adult Health

## 2023-01-08 ENCOUNTER — Ambulatory Visit (INDEPENDENT_AMBULATORY_CARE_PROVIDER_SITE_OTHER): Payer: Medicare HMO | Admitting: Adult Health

## 2023-01-08 VITALS — BP 100/60 | HR 98 | Temp 97.5°F | Ht 60.0 in | Wt 213.0 lb

## 2023-01-08 DIAGNOSIS — N401 Enlarged prostate with lower urinary tract symptoms: Secondary | ICD-10-CM | POA: Diagnosis not present

## 2023-01-08 DIAGNOSIS — R2681 Unsteadiness on feet: Secondary | ICD-10-CM | POA: Diagnosis not present

## 2023-01-08 DIAGNOSIS — R4189 Other symptoms and signs involving cognitive functions and awareness: Secondary | ICD-10-CM | POA: Diagnosis not present

## 2023-01-08 DIAGNOSIS — B356 Tinea cruris: Secondary | ICD-10-CM

## 2023-01-08 DIAGNOSIS — R7303 Prediabetes: Secondary | ICD-10-CM | POA: Diagnosis not present

## 2023-01-08 DIAGNOSIS — E785 Hyperlipidemia, unspecified: Secondary | ICD-10-CM | POA: Diagnosis not present

## 2023-01-08 DIAGNOSIS — Z6841 Body Mass Index (BMI) 40.0 and over, adult: Secondary | ICD-10-CM | POA: Diagnosis not present

## 2023-01-08 DIAGNOSIS — R351 Nocturia: Secondary | ICD-10-CM | POA: Diagnosis not present

## 2023-01-08 LAB — POCT GLYCOSYLATED HEMOGLOBIN (HGB A1C): Hemoglobin A1C: 5.7 % — AB (ref 4.0–5.6)

## 2023-01-08 MED ORDER — FINASTERIDE 5 MG PO TABS: 5.0000 mg | ORAL_TABLET | Freq: Every day | ORAL | 1 refills | Status: DC

## 2023-01-08 MED ORDER — MEMANTINE HCL 28 X 5 MG & 21 X 10 MG PO TABS: ORAL_TABLET | ORAL | 12 refills | Status: DC

## 2023-01-08 MED ORDER — CLOTRIMAZOLE-BETAMETHASONE 1-0.05 % EX CREA: 1.0000 | TOPICAL_CREAM | Freq: Two times a day (BID) | CUTANEOUS | 1 refills | Status: DC

## 2023-01-09 LAB — HEPATIC FUNCTION PANEL
ALT: 15 [IU]/L (ref 0–44)
AST: 13 [IU]/L (ref 0–40)
Albumin: 4.2 g/dL (ref 3.8–4.8)
Alkaline Phosphatase: 93 [IU]/L (ref 44–121)
Bilirubin Total: 0.5 mg/dL (ref 0.0–1.2)
Bilirubin, Direct: 0.17 mg/dL (ref 0.00–0.40)
Total Protein: 6.9 g/dL (ref 6.0–8.5)

## 2023-01-09 LAB — LIPID PANEL
Chol/HDL Ratio: 4 {ratio} (ref 0.0–5.0)
Cholesterol, Total: 201 mg/dL — ABNORMAL HIGH (ref 100–199)
HDL: 50 mg/dL (ref >39)
LDL Chol Calc (NIH): 129 mg/dL — ABNORMAL HIGH (ref 0–99)
Triglycerides: 121 mg/dL (ref 0–149)
VLDL Cholesterol Cal: 22 mg/dL (ref 5–40)

## 2023-01-12 ENCOUNTER — Other Ambulatory Visit: Payer: Self-pay

## 2023-01-12 ENCOUNTER — Ambulatory Visit (HOSPITAL_COMMUNITY): Payer: Medicare HMO | Admitting: Psychiatry

## 2023-01-12 ENCOUNTER — Encounter (HOSPITAL_COMMUNITY): Payer: Self-pay | Admitting: Psychiatry

## 2023-01-12 VITALS — BP 132/80 | HR 80 | Ht 68.5 in | Wt 203.0 lb

## 2023-01-12 DIAGNOSIS — F331 Major depressive disorder, recurrent, moderate: Secondary | ICD-10-CM | POA: Diagnosis not present

## 2023-01-12 DIAGNOSIS — F411 Generalized anxiety disorder: Secondary | ICD-10-CM | POA: Diagnosis not present

## 2023-01-12 MED ORDER — BUPROPION HCL ER (XL) 300 MG PO TB24: 300.0000 mg | ORAL_TABLET | ORAL | 2 refills | Status: DC

## 2023-01-12 MED ORDER — LAMOTRIGINE 25 MG PO TABS: 50.0000 mg | ORAL_TABLET | Freq: Every day | ORAL | 0 refills | Status: DC

## 2023-01-12 NOTE — Progress Notes (Signed)
 BH MD/PA/NP OP Progress Note  01/12/2023 5:01 PM Jay [name]  MRN:  [id number]  Visit Diagnosis:    ICD-10-CM   1. GAD (generalized anxiety disorder)  F41.1 lamoTRIgine  (LAMICTAL ) 25 MG tablet    2. Moderate episode of recurrent major depressive disorder (HCC)  F33.1 lamoTRIgine  (LAMICTAL ) 25 MG tablet    buPROPion  (WELLBUTRIN  XL) 300 MG 24 hr tablet      Assessment: Jay [name] is a 72 y.o. male with a history of MDD, GAD, COPD, HTN,  who presented to [facility] at [facility] for initial evaluation on 04/13/2022.    At initial evaluation patient reported neurovegetative symptoms of depression including low mood, anhedonia, amotivation, poor sleep, increased appetite, excessive tearfulness, fatigue, poor concentration, and intermittent passive SI.  He denied any passive SI at this time reporting that there is only been 2 episodes since 2022.  He denied any intent or plan since around 2014.  Safety planning and crisis resources were discussed. In addition to depression patient also reported significant anxiety.  He endorsed symptoms of constant worry that he is unable to control, difficulty relaxing, restlessness, increased irritability, and fear of something awful happening.  Patient did endorse having episodes of panic where he is short of breath, restless, diaphoretic, and can get muscle rigidity.  These were more frequent while on Wellbutrin  though still occur occasionally since discontinuing.  Patient met criteria for MDD and GAD.  Jay [name] presents for follow-up evaluation. Today, 01/12/23, patient reports    a steady decline in mood since his last appointment.  He endorses symptoms of amotivation, anhedonia, disturbed sleep pattern, decreased appetite, and fatigue.  He denies any SI or thoughts of self-harm.  Been taking his medication consistently denies any adverse side effects.  Patient is uncertain of any particular benefit from the medication and  thus we decided to trial a taper down on Lamictal .  We will also restart Wellbutrin  which had been on the past with some potential adverse side effects.  Of note patient reports there are other things going on that as well that could have contributed to the hyperactivity experienced with Wellbutrin  in the past.  Risk and benefits of both medications were discussed and patient will follow up in a month.  Plan: - Continue Cymbalta  60 mg BID for depressed mood/anxiety - Taper Lamictal  to 50 mg for 7 days before discontinuing - Continue Latuda  40 mg w/dinner for depressed mood - Increase Wellbutrin  XL 300 mg daily - Continue Donepezil  10 mg QD, managed by PCP - CMP and CBC reviewed - Therapy appointment on 1/ - Crisis resources reviewed - Follow up in a month  Chief Complaint:  Chief Complaint  Patient presents with   Follow-up   HPI: Jay [name] presents reporting that there may have been some slight improvement in the interim, but his motivation is still minimal. He did spend time with family over Christmas which he really enjoyed. We reviewed this and there does seem to be a more selective nature to this. [name] will still do things he enjoys like go turn on the TV to watch a show he likes. But things that he does not enjoy or difficult there will be procrastination on. In addition to this patient expressed some feelings of inadequacy or being a burden due to his physical limitations. He is scheduled to start physical therapy on 1/8 and was encouraged to work with them to rebuild some of his strength.   Patient had also disclosed  a sense that he has lost his purpose both due to his physical limitations and due to the transition from working to retirement. While this occurred 14 years ago, it was due to his physical limitations and patient struggled with the loss. We provided support around this. Also discussed some behavorial activation opportunities that could be beneficial for this and his mood.  For instance patient was more involved with the church in the past which he had enjoyed. Encouraged him to consider volunteering or other activities through there.   With the mediation changes made last visit he denies any adverse side effects. That being the case we will continue with the plan to taper off of Lamictal  and will increase Wellbutrin . Risks and benefits of both were reviewed. Recommended patient take the memotine that was prescribed by his PCP for concerns of dementia. We also recommended therapy referral which patient was agreeable to.   Past Psychiatric History: Had seen a psychiatrist 2009, no prior psychiatric hospitalizations, no prior suicide attempts.  He denies ever being connected with a therapist.  Has tried Wellbutrin , Xanax , Restoril in the past  Denies current substance. Alcohol and marijuana 20-30 years ago.  Past Medical History:  Past Medical History:  Diagnosis Date   Anxiety    Arthritis    COPD (chronic obstructive pulmonary disease) (HCC)    Depression    Dysrhythmia    GERD (gastroesophageal reflux disease)    Hypertension    PAF (paroxysmal atrial fibrillation) (HCC)    Shortness of breath    with excertion    Past Surgical History:  Procedure Laterality Date   APPENDECTOMY     COLONOSCOPY WITH PROPOFOL  N/A 03/25/2022   Procedure: COLONOSCOPY WITH PROPOFOL ;  Surgeon: Shaaron Lamar HERO, MD;  [facility];  Service: Endoscopy;  Laterality: N/A;  9:00 am   EYE SURGERY     POLYPECTOMY  03/25/2022   Procedure: POLYPECTOMY INTESTINAL;  Surgeon: Shaaron Lamar HERO, MD;  [facility];  Service: Endoscopy;;    Family History:  Family History  Problem Relation Age of Onset   Hypertension Mother    Cancer Father    Hypertension Father    Hypertension Sister    Cancer Brother    Hypertension Brother    Cancer - Colon Neg Hx    Colon polyps Neg Hx     Social History:  Social History   Socioeconomic History   Marital status:  Married    Spouse name: Not on file   Number of children: Not on file   Years of education: Not on file   Highest education level: Not on file  Occupational History   Not on file  Tobacco Use   Smoking status: Former    Current packs/day: 0.00    Types: Cigarettes    Quit date: 01/12/1993    Years since quitting: 30.0   Smokeless tobacco: Never  Vaping Use   Vaping status: Never Used  Substance and Sexual Activity   Alcohol use: No    Comment: Quit 1995   Drug use: No   Sexual activity: Not on file  Other Topics Concern   Not on file  Social History Narrative   Not on file   Social Drivers of Health   Financial Resource Strain: Low Risk  (07/03/2022)   Overall Financial Resource Strain (CARDIA)    Difficulty of Paying Living Expenses: Not hard at all  Food Insecurity: No Food Insecurity (07/03/2022)   Hunger Vital Sign  Worried About Programme Researcher, Broadcasting/film/video in the Last Year: Never true    Ran Out of Food in the Last Year: Never true  Transportation Needs: No Transportation Needs (07/03/2022)   PRAPARE - Administrator, Civil Service (Medical): No    Lack of Transportation (Non-Medical): No  Physical Activity: Inactive (07/03/2022)   Exercise Vital Sign    Days of Exercise per Week: 0 days    Minutes of Exercise per Session: 0 min  Stress: No Stress Concern Present (07/03/2022)   [employer] of Occupational Health - Occupational Stress Questionnaire    Feeling of Stress : Not at all  Social Connections: Socially Integrated (07/03/2022)   Social Connection and Isolation Panel [NHANES]    Frequency of Communication with Friends and Family: More than three times a week    Frequency of Social Gatherings with Friends and Family: More than three times a week    Attends Religious Services: More than 4 times per year    Active Member of [employer] or Organizations: Yes    Attends Engineer, Structural: More than 4 times per year    Marital Status: Married     Allergies:  No Active Allergies   Current Medications: Current Outpatient Medications  Medication Sig Dispense Refill   acetaminophen  (TYLENOL ) 500 MG tablet Take 1,000 mg by mouth every 6 (six) hours as needed for moderate pain.     apixaban  (ELIQUIS ) 5 MG TABS tablet Take 1 tablet (5 mg total) by mouth 2 (two) times daily. 28 tablet 0   atorvastatin  (LIPITOR) 40 MG tablet Take 1 tablet (40 mg total) by mouth daily. 90 tablet 3   clotrimazole -betamethasone  (LOTRISONE ) cream Apply 1 Application topically 2 (two) times daily. 60 g 1   diphenhydrAMINE (BENADRYL) 25 MG tablet Take 25 mg by mouth daily.     donepezil  (ARICEPT ) 10 MG tablet TAKE ONE TABLET BY MOUTH EVERYDAY AT BEDTIME 90 tablet 3   DULoxetine  (CYMBALTA ) 60 MG capsule Take 1 capsule (60 mg total) by mouth 2 (two) times daily. 180 capsule 0   esomeprazole  (NEXIUM ) 40 MG capsule Take 1 capsule by mouth in the morning 90 capsule 3   famotidine  (PEPCID ) 20 MG tablet Take 1 tablet by mouth once daily 90 tablet 1   finasteride  (PROSCAR ) 5 MG tablet Take 1 tablet (5 mg total) by mouth daily. 90 tablet 1   Fluticasone-Umeclidin-Vilant (TRELEGY ELLIPTA ) 100-62.5-25 MCG/ACT AEPB Inhale 1 puff into the lungs daily. 60 each 5   lamoTRIgine  (LAMICTAL ) 25 MG tablet Take 2 tablets (50 mg total) by mouth daily. Take for 7 days before stopping the lamotrigine  14 tablet 0   lurasidone  (LATUDA ) 40 MG TABS tablet Take 1 tablet (40 mg total) by mouth at bedtime. 90 tablet 0   memantine  (NAMENDA  TITRATION PAK) tablet pack 5 mg/day for =1 week; 5 mg twice daily for =1 week; 15 mg/day given in 5 mg and 10 mg separated doses for =1 week; then 10 mg twice daily 49 tablet 12   metoprolol  tartrate (LOPRESSOR ) 50 MG tablet Take 1 tablet (50 mg total) by mouth 2 (two) times daily. 90 tablet 3   Na Sulfate-K Sulfate-Mg Sulf 17.5-3.13-1.6 GM/177ML SOLN As directed 354 mL 0   tamsulosin  (FLOMAX ) 0.4 MG CAPS capsule Take 1 capsule (0.4 mg total) by mouth  daily. 90 capsule 3   traMADol  (ULTRAM ) 50 MG tablet Take 1 tablet (50 mg total) by mouth every 6 (six) hours as needed for severe  pain. 30 tablet 2   triamcinolone  cream (KENALOG ) 0.1 % Apply 1 Application topically daily as needed (rash). 30 g 2   albuterol  (VENTOLIN  HFA) 108 (90 Base) MCG/ACT inhaler Inhale 2 puffs into the lungs every 4 (four) hours as needed for wheezing or shortness of breath.     amLODipine  (NORVASC ) 5 MG tablet Take 1 tablet (5 mg total) by mouth every morning. 90 tablet 1   apixaban  (ELIQUIS ) 5 MG TABS tablet Take 1 tablet (5 mg total) by mouth 2 (two) times daily. 180 tablet 0   buPROPion  (WELLBUTRIN  XL) 300 MG 24 hr tablet Take 1 tablet (300 mg total) by mouth every morning. 30 tablet 2   No current facility-administered medications for this visit.     Musculoskeletal: Strength & Muscle Tone: within normal limits Gait & Station: normal Patient leans: N/A  Psychiatric Specialty Exam: Review of Systems  Blood pressure 132/80, pulse 80, height 5' 8.5 (1.74 m), weight 203 lb (92.1 kg).Body mass index is 30.42 kg/m.  General Appearance: Fairly Groomed  Eye Contact:  Good  Speech:  Clear and Coherent and Normal Rate  Volume:  Normal  Mood:  Depressed  Affect:  Appropriate  Thought Process:  Coherent  Orientation:  Full (Time, Place, and Person)  Thought Content: Logical and Rumination   Suicidal Thoughts:  No  Homicidal Thoughts:  No  Memory:  Immediate;   Good  Judgement:  Fair  Insight:  Fair  Psychomotor Activity:  Normal  Concentration:  Concentration: Fair  Recall:  Poor  Fund of Knowledge: Fair  Language: Good  Akathisia:  No    AIMS (if indicated): done  Assets:  Communication Skills Desire for Improvement Social Support Transportation  ADL's:  Intact  Cognition: WNL  Sleep:  Good   Metabolic Disorder Labs: Lab Results  Component Value Date   HGBA1C 5.7 (A) 01/08/2023   No results found for: PROLACTIN Lab Results  Component  Value Date   CHOL 201 (H) 01/08/2023   TRIG 121 01/08/2023   HDL 50 01/08/2023   CHOLHDL 4.0 01/08/2023   VLDL 85.1 07/08/2022   LDLCALC 129 (H) 01/08/2023   LDLCALC 115 (H) 07/08/2022   Lab Results  Component Value Date   TSH 2.77 07/08/2022   TSH 1.821 05/02/2021    Therapeutic Level Labs: No results found for: LITHIUM No results found for: VALPROATE No results found for: CBMZ   Screenings: GAD-7    Flowsheet Row Office Visit from 07/08/2022 in [facility] [place] Office Visit from [facility]/13/2022 [facility]  Total GAD-7 Score 13 20      PHQ2-9    Flowsheet Row Office Visit from 01/08/2023 in [facility] [place] Office Visit from 07/08/2022 in [facility] [place] Clinical Support from 07/03/2022 in [facility] [place] Office Visit from 04/13/2022 in [facility] Erroneous Encounter from 04/01/2022 in [facility] Total Score 6 6 0 6 2  PHQ-9 Total Score 24 21 -- 22 6      Flowsheet Row Office Visit from 04/13/2022 in [facility] Admission (Discharged) from 03/25/2022 in [facility] Pre-Admission Testing 60 from 03/23/2022 in [facility]  C-SSRS RISK CATEGORY Low Risk No Risk No Risk       Collaboration of Care: Collaboration of Care: Medication Management AEB medication prescription and Primary Care Provider AEB chart  review  Patient/Guardian was advised Release of Information must be obtained prior to any record release in order to collaborate their care with an outside provider. Patient/Guardian was advised if they have not already done so to contact the registration department to sign all necessary forms in order for us  to release information regarding their care.   40 minutes were spent in  chart review, interview, psycho education, counseling, medical decision making, coordination of care and long-term prognosis.  Patient was given opportunity to ask question and all concerns and questions were addressed and answers. Excluding separately billable services.   Consent: Patient/Guardian gives verbal consent for treatment and assignment of benefits for services provided during this visit. Patient/Guardian expressed understanding and agreed to proceed.    Arvella CHRISTELLA Finder, MD 01/12/2023, 5:01 PM

## 2023-01-13 ENCOUNTER — Encounter (HOSPITAL_COMMUNITY): Payer: Self-pay | Admitting: Psychiatry

## 2023-01-20 ENCOUNTER — Ambulatory Visit (HOSPITAL_COMMUNITY): Payer: Medicare HMO

## 2023-01-22 ENCOUNTER — Ambulatory Visit (HOSPITAL_COMMUNITY): Payer: Medicare HMO

## 2023-01-25 NOTE — Therapy (Signed)
 OUTPATIENT PHYSICAL THERAPY LOWER EXTREMITY EVALUATION   Patient Name: Jay [name] MRN: [id number] DOB:1950/02/19, 73 y.o., male Today's Date: 01/27/2023  END OF SESSION:  PT End of Session - 01/27/23 1633     Visit Number 1    Number of Visits 6    Date for PT Re-Evaluation 03/10/23    Authorization Type humana    Authorization Time Period Siegfried Dress put in    PT Start Time 1550    PT Stop Time 1630    PT Time Calculation (min) 40 min    Activity Tolerance Other (comment)    Behavior During Therapy WFL for tasks assessed/performed             Past Medical History:  Diagnosis Date   Anxiety    Arthritis    COPD (chronic obstructive pulmonary disease) (HCC)    Depression    Dysrhythmia    GERD (gastroesophageal reflux disease)    Hypertension    PAF (paroxysmal atrial fibrillation) (HCC)    Shortness of breath    with excertion   Past Surgical History:  Procedure Laterality Date   APPENDECTOMY     COLONOSCOPY WITH PROPOFOL  N/A 03/25/2022   Procedure: COLONOSCOPY WITH PROPOFOL ;  Surgeon: Suzette Espy, MD;  [facility];  Service: Endoscopy;  Laterality: N/A;  9:00 am   EYE SURGERY     POLYPECTOMY  03/25/2022   Procedure: POLYPECTOMY INTESTINAL;  Surgeon: Suzette Espy, MD;  [facility];  Service: Endoscopy;;   Patient Active Problem List   Diagnosis Date Noted   GAD (generalized anxiety disorder) 04/13/2022   MDD (major depressive disorder) 04/13/2022   DOE (dyspnea on exertion) 11/11/2021   Intracranial bleeding (HCC) 03/04/2021   Intracranial hemorrhage (HCC) 03/03/2021   Syncope and collapse 03/03/2021   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    PERTINENT HISTORY: 73 y.o. y/o male with a h/o syncope, with history of small ICH managed conservatively, atrial fibrillation on anticoagulation therapy with Eliquis , carotid artery disease, chronic chest pain with no evidence of ACS with most recent nuclear medicine stress test August 2023 with no ischemia, LVEF of 55 to 60% per echo 12/01/2021. PAIN:  Are you having pain? Yes Back hurts all the time; currently 3; pain goes down both legs to the ankle approximately 2x per week   PRECAUTIONS: None   WEIGHT BEARING RESTRICTIONS: No  FALLS:  Has patient fallen in last 6 months? One fall its like I can't pick my feet up.   LIVING ENVIRONMENT: Lives with: lives with their spouse Lives in: House/apartment Stairs: Yes: External: 3 steps; on right going up sometimes pt is unable to go up reciprocal  Has following equipment at home: Single point cane  OCCUPATION: retired   PLOF: retired  PATIENT GOALS: To walk better, not fall  NEXT MD VISIT: not scheduled   OBJECTIVE:  Note: Objective measures were completed at Evaluation unless otherwise noted.  DIAGNOSTIC FINDINGS: cervical  x ray ; no abnormalities +OA   COGNITION: Overall cognitive status: Within functional limits for tasks assessed    POSTURE: rounded shoulders, forward head, and decreased lumbar lordosis     LOWER EXTREMITY MMT:  MMT Right eval Left eval  Hip flexion 4+ 4+  Hip extension 3 3  Hip abduction 3+ 4+  Hip adduction    Hip internal rotation    Hip external rotation    Knee flexion 3+ 4+  Knee extension 5 5  Ankle dorsiflexion 3+ 3+  Ankle plantarflexion    Ankle inversion    Ankle eversion     (Blank rows = not tested) FUNCTIONAL TESTS:  30  seconds chair stand test:   5 x ; at pt age and sex:  67 is average  2 minute walk test: 286 ft   Single leg stance:  Rt:  0                               LT:  0                                                                                                                                TREATMENT DATE: 01/27/23 Evaluation   Sit to stand: x 5 Bridge x 5     PATIENT EDUCATION:  Education details: HEp Person educated: Patient and Spouse Education method: Medical illustrator Education comprehension: verbalized understanding and returned demonstration  HOME EXERCISE PROGRAM: Access Code: [email or web address] URL: [email or web address] Date: 01/27/2023 Prepared by: Leodis Rainwater  Exercises - Heel Raises with Counter Support  - 2 x daily - 7 x weekly - 1 sets - 10 reps - 3-5 seconds  hold - Sit to Stand Without Arm Support  - 3 x daily - 7 x weekly - 1 sets - 10 reps - 5" hold - Seated Heel Toe Raises  - 3 x daily - 7 x weekly - 1 sets - 10 reps - 5" hold - Supine Bridge  - 2 x daily - 7 x weekly - 1 sets - 10 reps - 3-5 seconds  hold  ASSESSMENT:  CLINICAL IMPRESSION: Patient is a 73 y.o. male who was seen today for physical therapy evaluation and treatment for R26.81 (ICD-10-CM) - Gait instability.   Evaluation demonstrates decreased strength, decreased balance, decreased activity balance  and history of falling.  Mr. Berne will benefit from skilled PT to address this issues and maximize her functional ability.  PT had not eaten since breakfast, became hot, nauseated and began throwing up during treatment.  Given chocolate pudding and apple juice pt verbalized that he felt better.   OBJECTIVE IMPAIRMENTS: Abnormal gait, decreased activity tolerance, decreased balance, difficulty walking, and decreased strength.   ACTIVITY LIMITATIONS: carrying, lifting, standing, squatting, and locomotion level   REHAB POTENTIAL: Good  CLINICAL DECISION MAKING:  Stable/uncomplicated  EVALUATION COMPLEXITY: Low   GOALS:  Goals reviewed with patient? No  SHORT TERM GOALS: Target date: 02/17/23 Pt to be I in HEP in order to be able to walk for five minutes without difficulty Baseline: Goal status: INITIAL  2.  Pt to be able to single leg stance x 10 seconds B to reduce risk of falling  Baseline:  Goal status: INITIAL  3.  Pt to be able to stand while taking a shower.  Baseline:  Goal status: INITIAL   LONG TERM GOALS: Target date: 03/10/23  Pt to be I in an advanced HEP in order to be able to go up and down 3 steps in a reciprocal manner Baseline:  Goal status: INITIAL  2.  PT to be able to single leg stance x 15 seconds to reduce risk of falling  Baseline:  Goal status: INITIAL  3.  PT to be able to dress and undress himself with increased ease.  Baseline:  Goal status: INITIAL     PLAN:  PT FREQUENCY: 1x/week due to financial   PT DURATION: 6 weeks  PLANNED INTERVENTIONS: 97110-Therapeutic exercises, 97530- Therapeutic activity, [id number]- Neuromuscular re-education, 97535- Self Care, and 16109- Manual therapy  PLAN FOR NEXT SESSION: continue with strengthening and balance activity.   Leodis Rainwater, PT CLT [phone]  01/27/2023, 4:36 PM  [employer] Auth Request  Referring diagnosis code (ICD 10)? M62.9 Treatment diagnosis codes (ICD 10)? (if different than referring diagnosis) M62.9, M62.81, [phone] What was this (referring dx) caused by? []  Surgery []  Fall []  Ongoing issue []  Arthritis []  Other: ____________  Laterality: []  Rt []  Lt [x]  Both  Deficits: []  Pain []  Stiffness [x]  Weakness []  Edema [x]  Balance Deficits []  Coordination []  Gait Disturbance []  ROM []  Other   Functional Tool Score: sit to stand 30 seconds 5 2 minute gt 286 ft   CPT codes: See Planned Interventions listed in the Plan section of the Evaluation.

## 2023-01-27 ENCOUNTER — Ambulatory Visit (HOSPITAL_COMMUNITY): Payer: Medicare HMO | Attending: Adult Health | Admitting: Physical Therapy

## 2023-01-27 DIAGNOSIS — M6281 Muscle weakness (generalized): Secondary | ICD-10-CM | POA: Diagnosis not present

## 2023-01-27 DIAGNOSIS — Z9181 History of falling: Secondary | ICD-10-CM | POA: Insufficient documentation

## 2023-01-27 DIAGNOSIS — R2681 Unsteadiness on feet: Secondary | ICD-10-CM | POA: Diagnosis not present

## 2023-01-27 DIAGNOSIS — R2689 Other abnormalities of gait and mobility: Secondary | ICD-10-CM | POA: Insufficient documentation

## 2023-01-27 DIAGNOSIS — S0990XA Unspecified injury of head, initial encounter: Secondary | ICD-10-CM | POA: Diagnosis not present

## 2023-02-05 ENCOUNTER — Ambulatory Visit (HOSPITAL_COMMUNITY): Payer: Medicare HMO | Admitting: Physical Therapy

## 2023-02-10 ENCOUNTER — Other Ambulatory Visit: Payer: Self-pay | Admitting: Adult Health

## 2023-02-10 ENCOUNTER — Ambulatory Visit (HOSPITAL_COMMUNITY): Payer: Medicare HMO | Admitting: Physical Therapy

## 2023-02-10 DIAGNOSIS — S0990XA Unspecified injury of head, initial encounter: Secondary | ICD-10-CM

## 2023-02-10 DIAGNOSIS — Z9181 History of falling: Secondary | ICD-10-CM | POA: Diagnosis not present

## 2023-02-10 DIAGNOSIS — R2681 Unsteadiness on feet: Secondary | ICD-10-CM | POA: Diagnosis not present

## 2023-02-10 DIAGNOSIS — M6281 Muscle weakness (generalized): Secondary | ICD-10-CM

## 2023-02-10 DIAGNOSIS — I4811 Longstanding persistent atrial fibrillation: Secondary | ICD-10-CM

## 2023-02-10 DIAGNOSIS — R2689 Other abnormalities of gait and mobility: Secondary | ICD-10-CM

## 2023-02-10 MED ORDER — APIXABAN 5 MG PO TABS: 5.0000 mg | ORAL_TABLET | Freq: Two times a day (BID) | ORAL | Status: DC

## 2023-02-10 NOTE — Therapy (Signed)
OUTPATIENT PHYSICAL THERAPY LOWER EXTREMITY Treatment   Patient Name: Jay [name] MRN: [id number] DOB:October 28, 1950, 73 y.o., male Today's Date: 02/10/2023  END OF SESSION:  PT End of Session - 02/10/23 1712     Visit Number 2    Number of Visits 6    Date for PT Re-Evaluation 03/10/23    Authorization Type humana    Authorization Time Period 6 visits approved from 1/16-2/28    Authorization - Visit Number 1    Authorization - Number of Visits 6    Progress Note Due on Visit 7    PT Start Time 1640    PT Stop Time 1713    PT Time Calculation (min) 33 min    Activity Tolerance Other (comment)    Behavior During Therapy WFL for tasks assessed/performed              Past Medical History:  Diagnosis Date   Anxiety    Arthritis    COPD (chronic obstructive pulmonary disease) (HCC)    Depression    Dysrhythmia    GERD (gastroesophageal reflux disease)    Hypertension    PAF (paroxysmal atrial fibrillation) (HCC)    Shortness of breath    with excertion   Past Surgical History:  Procedure Laterality Date   APPENDECTOMY     COLONOSCOPY WITH PROPOFOL N/A 03/25/2022   Procedure: COLONOSCOPY WITH PROPOFOL;  Surgeon: Corbin Ade, MD;  [facility];  Service: Endoscopy;  Laterality: N/A;  9:00 am   EYE SURGERY     POLYPECTOMY  03/25/2022   Procedure: POLYPECTOMY INTESTINAL;  Surgeon: Corbin Ade, MD;  Location: [facility];  Service: Endoscopy;;   Patient Active Problem List   Diagnosis Date Noted   GAD (generalized anxiety disorder) 04/13/2022   MDD (major depressive disorder) 04/13/2022   DOE (dyspnea on exertion) 11/11/2021   Intracranial bleeding (HCC) 03/04/2021   Intracranial hemorrhage (HCC) 03/03/2021   Syncope and collapse 03/03/2021   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   PERTINENT HISTORY: 73 y.o. y/o male with a h/o syncope, with history of small ICH managed conservatively, atrial fibrillation on anticoagulation therapy with Eliquis, carotid artery disease, chronic chest pain with no evidence of ACS with most recent nuclear medicine stress test August 2023 with no ischemia, LVEF of 55 to 60% per echo 12/01/2021. PAIN:  Are you having pain? Yes Back hurts all the time; currently 3; pain goes down both legs to the ankle approximately 2x per week   PRECAUTIONS: None   WEIGHT BEARING RESTRICTIONS: No  FALLS:  Has patient fallen in last 6 months? One fall its like I can't pick my feet up.   LIVING ENVIRONMENT: Lives with: lives with their spouse Lives in: House/apartment Stairs: Yes: External: 3 steps; on right going up sometimes pt is unable to go up reciprocal  Has following equipment at home: Single point cane  OCCUPATION: retired   PLOF: retired  PATIENT GOALS: To walk  better, not fall  NEXT MD VISIT: not scheduled   OBJECTIVE:  Note: Objective measures were completed at Evaluation unless otherwise noted.  DIAGNOSTIC FINDINGS: cervical x ray ; no abnormalities +OA   COGNITION: Overall cognitive status: Within functional limits for tasks assessed    POSTURE: rounded shoulders, forward head, and decreased lumbar lordosis     LOWER EXTREMITY MMT:  MMT Right eval Left eval  Hip flexion 4+ 4+  Hip extension 3 3  Hip abduction 3+ 4+  Hip adduction    Hip internal rotation    Hip external rotation    Knee flexion 3+ 4+  Knee extension 5 5  Ankle dorsiflexion  3+ 3+  Ankle plantarflexion    Ankle inversion    Ankle eversion     (Blank rows = not tested) FUNCTIONAL TESTS:  30 seconds chair stand test:   5 x ; at pt age and sex:  20 is average  2 minute walk test: 286 ft   Single leg stance:  Rt:  0                               LT:  0                                                                                                                                TREATMENT DATE:  02/10/23 Side step with green theraband x 2 rt Squat with green theraband x 10 Hip extension with green theraband x 10  Semi-tandem stance x 5 each 4" forward step up B x 10 10 sit to stand      01/27/23 Evaluation   Sit to stand: x 5 Bridge x 5     PATIENT EDUCATION:  Education details: HEp Person educated: Patient and Spouse Education method: Medical illustrator Education comprehension: verbalized understanding and returned demonstration  HOME EXERCISE PROGRAM: Access Code: [email or web address] URL: [email or web address] Date: 01/27/2023 Prepared by: Virgina Organ  Exercises - Heel Raises with Counter Support  - 2 x daily - 7 x weekly - 1 sets - 10 reps - 3-5 seconds  hold - Sit to Stand Without Arm Support  - 3 x daily - 7 x weekly - 1 sets - 10 reps - 5" hold - Seated Heel Toe Raises  - 3 x daily - 7 x weekly - 1 sets - 10 reps - 5" hold - Supine Bridge  - 2 x daily - 7 x weekly - 1 sets - 10 reps - 3-5 seconds  hold 02/10/23 - Side Stepping with Resistance at Thighs  - 2 x daily - 7 x weekly - 1 sets - 10 reps - Hip Extension with Resistance Loop  - 2 x daily - 7 x weekly - 1 sets - 10 reps - Side Stepping with Resistance at Thighs and Counter Support  - 2 x daily - 7 x  weekly - 1 sets - 10 reps ASSESSMENT:  CLINICAL IMPRESSION: Therapist reviewed evaluation and goals.  Advanced HEP. Pt limited by fatigue and COPD>   Pt continues to demonstrates decreased strength, decreased balance, decreased activity balance  and history of  falling.  Mr. Flight will benefit from skilled PT to address this issues and maximize her functional ability.   OBJECTIVE IMPAIRMENTS: Abnormal gait, decreased activity tolerance, decreased balance, difficulty walking, and decreased strength.   ACTIVITY LIMITATIONS: carrying, lifting, standing, squatting, and locomotion level   REHAB POTENTIAL: Good  CLINICAL DECISION MAKING: Stable/uncomplicated  EVALUATION COMPLEXITY: Low   GOALS: Goals reviewed with patient? No  SHORT TERM GOALS: Target date: 02/17/23 Pt to be I in HEP in order to be able to walk for five minutes without difficulty Baseline: Goal status: on-going  2.  Pt to be able to single leg stance x 10 seconds B to reduce risk of falling  Baseline:  Goal status: on-going  3.  Pt to be able to stand while taking a shower.  Baseline:  Goal status: on-going   LONG TERM GOALS: Target date: 03/10/23  Pt to be I in an advanced HEP in order to be able to go up and down 3 steps in a reciprocal manner Baseline:  Goal status: on-going  2.  PT to be able to single leg stance x 15 seconds to reduce risk of falling  Baseline:  Goal status: on-going  3.  PT to be able to dress and undress himself with increased ease.  Baseline:  Goal status: on-going     PLAN:  PT FREQUENCY: 1x/week due to financial   PT DURATION: 6 weeks  PLANNED INTERVENTIONS: 97110-Therapeutic exercises, 97530- Therapeutic activity, 97112- Neuromuscular re-education, 97535- Self Care, and 47829- Manual therapy  PLAN FOR NEXT SESSION: continue with strengthening and balance activity to include marching   Update HEP each week add tandem stance and step ups to HEP   Virgina Organ, PT CLT [phone]  02/10/2023, 5:14 PM

## 2023-02-10 NOTE — Telephone Encounter (Signed)
Copied from CRM [id number]. Topic: Clinical - Medication Refill >> Feb 10, 2023  7:59 AM Pascal Lux wrote: Most Recent Primary Care Visit:  Provider: Shirline Frees  Department: [facility]  Visit Type: OFFICE VISIT  Date: 01/08/2023  Medication: apixaban (ELIQUIS) 5 MG TABS tablet [[id number]]  Has the patient contacted their pharmacy? Yes (Agent: If no, request that the patient contact the pharmacy for the refill. If patient does not wish to contact the pharmacy document the reason why and proceed with request.) (Agent: If yes, when and what did the pharmacy advise?)  Is this the correct pharmacy for this prescription? Yes If no, delete pharmacy and type the correct one.  This is the patient's preferred pharmacy:  [facility] [address], [place] - [address] [place] [address] [place] [address] Phone: [phone] Fax: [phone]   Has the prescription been filled recently? No  Is the patient out of the medication? Yes  Has the patient been seen for an appointment in the last year OR does the patient have an upcoming appointment? Yes  Can we respond through MyChart? Yes  Agent: Please be advised that Rx refills may take up to 3 business days. We ask that you follow-up with your pharmacy.

## 2023-02-12 ENCOUNTER — Telehealth: Payer: Self-pay

## 2023-02-12 NOTE — Telephone Encounter (Signed)
Received a fax requesting a prescription of namenda. Pharmacy is asking if pt should be on 10 mg BID. If so, they will need a new Rx for this.

## 2023-02-16 ENCOUNTER — Ambulatory Visit (INDEPENDENT_AMBULATORY_CARE_PROVIDER_SITE_OTHER): Payer: Medicare HMO | Admitting: Licensed Clinical Social Worker

## 2023-02-16 ENCOUNTER — Other Ambulatory Visit: Payer: Self-pay | Admitting: Adult Health

## 2023-02-16 DIAGNOSIS — F411 Generalized anxiety disorder: Secondary | ICD-10-CM

## 2023-02-16 DIAGNOSIS — F332 Major depressive disorder, recurrent severe without psychotic features: Secondary | ICD-10-CM

## 2023-02-16 MED ORDER — MEMANTINE HCL 10 MG PO TABS: 10.0000 mg | ORAL_TABLET | Freq: Two times a day (BID) | ORAL | 3 refills | Status: DC
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
## 2023-02-17 ENCOUNTER — Encounter (HOSPITAL_COMMUNITY): Payer: Medicare HMO | Admitting: Physical Therapy

## 2023-02-24 ENCOUNTER — Encounter (HOSPITAL_COMMUNITY): Payer: Medicare HMO | Admitting: Physical Therapy

## 2023-03-02 ENCOUNTER — Ambulatory Visit (HOSPITAL_COMMUNITY): Payer: Medicare HMO | Admitting: Psychiatry

## 2023-03-03 ENCOUNTER — Encounter (HOSPITAL_COMMUNITY): Payer: Medicare HMO | Admitting: Physical Therapy

## 2023-03-09 ENCOUNTER — Ambulatory Visit (HOSPITAL_BASED_OUTPATIENT_CLINIC_OR_DEPARTMENT_OTHER): Payer: Medicare HMO | Admitting: Psychiatry

## 2023-03-09 VITALS — BP 124/74 | Resp 20 | Ht 68.5 in | Wt 207.0 lb

## 2023-03-09 DIAGNOSIS — F332 Major depressive disorder, recurrent severe without psychotic features: Secondary | ICD-10-CM | POA: Diagnosis not present

## 2023-03-09 DIAGNOSIS — F331 Major depressive disorder, recurrent, moderate: Secondary | ICD-10-CM | POA: Diagnosis not present

## 2023-03-09 DIAGNOSIS — F411 Generalized anxiety disorder: Secondary | ICD-10-CM

## 2023-03-09 DIAGNOSIS — Z79899 Other long term (current) drug therapy: Secondary | ICD-10-CM

## 2023-03-09 MED ORDER — DULOXETINE HCL 60 MG PO CPEP: 60.0000 mg | ORAL_CAPSULE | Freq: Two times a day (BID) | ORAL | 0 refills | Status: DC

## 2023-03-09 MED ORDER — BUPROPION HCL ER (XL) 150 MG PO TB24
450.0000 mg | ORAL_TABLET | ORAL | 2 refills | Status: DC
Start: 2023-03-09 — End: 2023-05-04

## 2023-03-09 MED ORDER — LURASIDONE HCL 40 MG PO TABS: 40.0000 mg | ORAL_TABLET | Freq: Every day | ORAL | 0 refills | Status: DC

## 2023-03-09 NOTE — Progress Notes (Unsigned)
 BH MD/PA/NP OP Progress Note  03/09/2023 5:07 PM Jay [name]  MRN:  [id number]  Visit Diagnosis:    ICD-10-CM   1. Severe episode of recurrent major depressive disorder, without psychotic features (HCC)  F33.2     2. GAD (generalized anxiety disorder)  F41.1 DULoxetine (CYMBALTA) 60 MG capsule    lurasidone (LATUDA) 40 MG TABS tablet    3. Moderate episode of recurrent major depressive disorder (HCC)  F33.1 buPROPion (WELLBUTRIN XL) 150 MG 24 hr tablet    DULoxetine (CYMBALTA) 60 MG capsule    lurasidone (LATUDA) 40 MG TABS tablet    4. Encounter for long-term (current) use of medications  Z79.899 CBC with Differential/Platelet    CMP14+EGFR    Testosterone    Vitamin D (25 hydroxy)    buPROPion (WELLBUTRIN XL) 150 MG 24 hr tablet       Assessment: Jay [name] is a 73 y.o. male with a history of MDD, GAD, COPD, HTN,  who presented to [facility] at [facility] for initial evaluation on 04/13/2022.    At initial evaluation patient reported neurovegetative symptoms of depression including low mood, anhedonia, amotivation, poor sleep, increased appetite, excessive tearfulness, fatigue, poor concentration, and intermittent passive SI.  He denied any passive SI at this time reporting that there is only been 2 episodes since 2022.  He denied any intent or plan since around 2014.  Safety planning and crisis resources were discussed. In addition to depression patient also reported significant anxiety.  He endorsed symptoms of constant worry that he is unable to control, difficulty relaxing, restlessness, increased irritability, and fear of something awful happening.  Patient did endorse having episodes of panic where he is short of breath, restless, diaphoretic, and can get muscle rigidity.  These were more frequent while on Wellbutrin though still occur occasionally since discontinuing.  Patient met criteria for MDD and GAD.  Jay [name] presents for follow-up  evaluation. Today, 03/09/23, patient reports some gradual improvement in mood and sleep in the interim.  There is also been no notable change in irritability.  We will titrate Wellbutrin to 450 mg daily.   a steady decline in mood since his last appointment.  He endorses symptoms of amotivation, anhedonia, disturbed sleep pattern, decreased appetite, and fatigue.  He denies any SI or thoughts of self-harm.  Been taking his medication consistently denies any adverse side effects.  Patient is uncertain of any particular benefit from the medication and thus we decided to trial a taper down on Lamictal.  We will also restart Wellbutrin which had been on the past with some potential adverse side effects.  Of note patient reports there are other things going on that as well that could have contributed to the hyperactivity experienced with Wellbutrin in the past.  Risk and benefits of both medications were discussed and patient will follow up in a month.  Plan: - Continue Cymbalta 60 mg BID for depressed mood/anxiety - Continue Latuda 40 mg w/dinner for depressed mood - Increase Wellbutrin XL to 450 mg daily - Continue Donepezil 10 mg QD, managed by PCP - CMP, CBC, Vitamin D,  - Continue therapy with Jay [name] - Crisis resources reviewed - Follow up in a month  Chief Complaint:  Chief Complaint  Patient presents with   Follow-up   HPI: Jay [name] presents reporting that he has noticed some gradual improvement over the last couple months.  With the taper off of Lamictal and up on Wellbutrin he has found  that his thought process has started to improve.  He has less negative self thoughts and is able to move past things that bothered him in the past.  Furthermore he is sleeping better at night typically getting around 6 to 7 hours of sleep and feeling well rested the next day.  There has not been significant improvement in his energy or motivation symptoms.  Patient still spends most of his time on the couch  during the days.  He expresses some desire to engage in other activities but is limited by the back pain and the fatigue.  Patient's wife joined the session who confirmed Jay [name]'s report.  She also mentioned that there has been no increase or change in irritability with the tapering of Lamictal.  The only period of irritability she tends to notice is when she pushes him to eat and he is not hungry.  We worked some on cognitive reframing and encouraged behavioral activation.  Education was provided how gradual progression in activity/movement can in fact improve pain symptoms over time.  Furthermore the fatigue he is currently experiencing would likely improve from this as well.  Discussed getting some blood work to check for potential causes of fatigue such as low vitamin D or testosterone levels.  We will also repeat CMP and CBC monitoring GFR.  If it remains low we may want to consider alternatives to duloxetine.  We will titrate Wellbutrin today and reviewed the risk and benefits.  Of note patient is not taking memantine or Eliquis.  The memantine appears to be due to a pharmacy issue while the Eliquis was cost prohibitive.  We recommended that the patient discuss this with his PCP. Particularly alternative blood thinning options as he might need to be on something to decrease the risk for stroke.  Past Psychiatric History: Had seen a psychiatrist 2009, no prior psychiatric hospitalizations, no prior suicide attempts.  He denies ever being connected with a therapist.  Has tried Wellbutrin, Xanax, Lamictal, Restoril in the past  Denies current substance. Alcohol and marijuana 20-30 years ago.  Past Medical History:  Past Medical History:  Diagnosis Date   Anxiety    Arthritis    COPD (chronic obstructive pulmonary disease) (HCC)    Depression    Dysrhythmia    GERD (gastroesophageal reflux disease)    Hypertension    PAF (paroxysmal atrial fibrillation) (HCC)    Shortness of breath     with excertion    Past Surgical History:  Procedure Laterality Date   APPENDECTOMY     COLONOSCOPY WITH PROPOFOL N/A 03/25/2022   Procedure: COLONOSCOPY WITH PROPOFOL;  Surgeon: Corbin Ade, MD;  [facility];  Service: Endoscopy;  Laterality: N/A;  9:00 am   EYE SURGERY     POLYPECTOMY  03/25/2022   Procedure: POLYPECTOMY INTESTINAL;  Surgeon: Corbin Ade, MD;  Location: [facility];  Service: Endoscopy;;    Family History:  Family History  Problem Relation Age of Onset   Hypertension Mother    Cancer Father    Hypertension Father    Hypertension Sister    Cancer Brother    Hypertension Brother    Cancer - Colon Neg Hx    Colon polyps Neg Hx     Social History:  Social History   Socioeconomic History   Marital status: Married    Spouse name: Not on file   Number of children: Not on file   Years of education: Not on file   Highest  education level: Not on file  Occupational History   Not on file  Tobacco Use   Smoking status: Former    Current packs/day: 0.00    Types: Cigarettes    Quit date: 01/12/1993    Years since quitting: 30.1   Smokeless tobacco: Never  Vaping Use   Vaping status: Never Used  Substance and Sexual Activity   Alcohol use: No    Comment: Quit 1995   Drug use: No   Sexual activity: Not on file  Other Topics Concern   Not on file  Social History Narrative   Not on file   Social Drivers of Health   Financial Resource Strain: Low Risk  (07/03/2022)   Overall Financial Resource Strain (CARDIA)    Difficulty of Paying Living Expenses: Not hard at all  Food Insecurity: No Food Insecurity (07/03/2022)   Hunger Vital Sign    Worried About Running Out of Food in the Last Year: Never true    Ran Out of Food in the Last Year: Never true  Transportation Needs: No Transportation Needs (07/03/2022)   PRAPARE - Administrator, Civil Service (Medical): No    Lack of Transportation (Non-Medical): No  Physical Activity:  Inactive (07/03/2022)   Exercise Vital Sign    Days of Exercise per Week: 0 days    Minutes of Exercise per Session: 0 min  Stress: No Stress Concern Present (07/03/2022)   [employer] of Occupational Health - Occupational Stress Questionnaire    Feeling of Stress : Not at all  Social Connections: Socially Integrated (07/03/2022)   Social Connection and Isolation Panel [NHANES]    Frequency of Communication with Friends and Family: More than three times a week    Frequency of Social Gatherings with Friends and Family: More than three times a week    Attends Religious Services: More than 4 times per year    Active Member of [employer] or Organizations: Yes    Attends Engineer, structural: More than 4 times per year    Marital Status: Married    Allergies:  No Active Allergies   Current Medications: Current Outpatient Medications  Medication Sig Dispense Refill   acetaminophen (TYLENOL) 500 MG tablet Take 1,000 mg by mouth every 6 (six) hours as needed for moderate pain.     albuterol (VENTOLIN HFA) 108 (90 Base) MCG/ACT inhaler Inhale 2 puffs into the lungs every 4 (four) hours as needed for wheezing or shortness of breath.     amLODipine (NORVASC) 5 MG tablet Take 1 tablet (5 mg total) by mouth every morning. 90 tablet 1   apixaban (ELIQUIS) 5 MG TABS tablet Take 1 tablet (5 mg total) by mouth 2 (two) times daily.     atorvastatin (LIPITOR) 40 MG tablet Take 1 tablet (40 mg total) by mouth daily. 90 tablet 3   buPROPion (WELLBUTRIN XL) 150 MG 24 hr tablet Take 3 tablets (450 mg total) by mouth every morning. 90 tablet 2   clotrimazole-betamethasone (LOTRISONE) cream Apply 1 Application topically 2 (two) times daily. 60 g 1   diphenhydrAMINE (BENADRYL) 25 MG tablet Take 25 mg by mouth daily.     donepezil (ARICEPT) 10 MG tablet TAKE ONE TABLET BY MOUTH EVERYDAY AT BEDTIME 90 tablet 3   DULoxetine (CYMBALTA) 60 MG capsule Take 1 capsule (60 mg total) by mouth 2 (two) times  daily. 180 capsule 0   esomeprazole (NEXIUM) 40 MG capsule Take 1 capsule by mouth in the morning 90  capsule 3   famotidine (PEPCID) 20 MG tablet Take 1 tablet by mouth once daily 90 tablet 1   finasteride (PROSCAR) 5 MG tablet Take 1 tablet (5 mg total) by mouth daily. 90 tablet 1   Fluticasone-Umeclidin-Vilant (TRELEGY ELLIPTA) 100-62.5-25 MCG/ACT AEPB Inhale 1 puff into the lungs daily. 60 each 5   lurasidone (LATUDA) 40 MG TABS tablet Take 1 tablet (40 mg total) by mouth at bedtime. 90 tablet 0   memantine (NAMENDA) 10 MG tablet Take 1 tablet (10 mg total) by mouth 2 (two) times daily. 180 tablet 3   metoprolol tartrate (LOPRESSOR) 50 MG tablet Take 1 tablet (50 mg total) by mouth 2 (two) times daily. 90 tablet 3   Na Sulfate-K Sulfate-Mg Sulf 17.5-3.13-1.6 GM/177ML SOLN As directed 354 mL 0   tamsulosin (FLOMAX) 0.4 MG CAPS capsule Take 1 capsule (0.4 mg total) by mouth daily. 90 capsule 3   traMADol (ULTRAM) 50 MG tablet Take 1 tablet (50 mg total) by mouth every 6 (six) hours as needed for severe pain. 30 tablet 2   triamcinolone cream (KENALOG) 0.1 % Apply 1 Application topically daily as needed (rash). 30 g 2   No current facility-administered medications for this visit.     Musculoskeletal: Strength & Muscle Tone: within normal limits Gait & Station: normal Patient leans: N/A  Psychiatric Specialty Exam: Review of Systems  Blood pressure 124/74, resp. rate 20, height 5' 8.5" (1.74 m), weight 207 lb (93.9 kg).Body mass index is 31.02 kg/m.  General Appearance: Fairly Groomed  Eye Contact:  Good  Speech:  Clear and Coherent and Normal Rate  Volume:  Normal  Mood:  Depressed  Affect:  Appropriate  Thought Process:  Coherent  Orientation:  Full (Time, Place, and Person)  Thought Content: Logical and Rumination   Suicidal Thoughts:  No  Homicidal Thoughts:  No  Memory:  Immediate;   Good  Judgement:  Fair  Insight:  Fair  Psychomotor Activity:  Normal  Concentration:   Concentration: Fair  Recall:  Poor  Fund of Knowledge: Fair  Language: Good  Akathisia:  No    AIMS (if indicated): done  Assets:  Communication Skills Desire for Improvement Social Support Transportation  ADL's:  Intact  Cognition: WNL  Sleep:  Good   Metabolic Disorder Labs: Lab Results  Component Value Date   HGBA1C 5.7 (A) 01/08/2023   No results found for: "PROLACTIN" Lab Results  Component Value Date   CHOL 201 (H) 01/08/2023   TRIG 121 01/08/2023   HDL 50 01/08/2023   CHOLHDL 4.0 01/08/2023   VLDL 16.1 07/08/2022   LDLCALC 129 (H) 01/08/2023   LDLCALC 115 (H) 07/08/2022   Lab Results  Component Value Date   TSH 2.77 07/08/2022   TSH 1.821 05/02/2021    Therapeutic Level Labs: No results found for: "LITHIUM" No results found for: "VALPROATE" No results found for: "CBMZ"   Screenings: GAD-7    Flowsheet Row Counselor from 02/16/2023 in [facility] at [facility] Visit from 07/08/2022 in [facility] [place] Office Visit from [facility]/13/2022 [facility]  Total GAD-7 Score 14 13 20       [employer]    Flowsheet Row Counselor from 02/16/2023 in [facility] Visit from 01/08/2023 in [facility] [place] [facility] [place] Office Visit from 07/08/2022 in [facility] [place] Clinical Support from 07/03/2022 in [facility] [place] [facility] [place]  Office Visit from 04/13/2022 in [facility]  PHQ-2 Total Score 6 6 6  0 6  PHQ-9 Total Score 20 24 21  -- 22      Flowsheet Row Counselor from 02/16/2023 in [facility] at [facility] Visit from 04/13/2022 in [facility] Admission (Discharged) from 03/25/2022 in [facility]  C-SSRS RISK CATEGORY No Risk Low Risk No Risk        Collaboration of Care: Collaboration of Care: Medication Management AEB medication prescription and Primary Care Provider AEB chart review  Patient/Guardian was advised Release of Information must be obtained prior to any record release in order to collaborate their care with an outside provider. Patient/Guardian was advised if they have not already done so to contact the registration department to sign all necessary forms in order for [place] to release information regarding their care.   40 minutes were spent in chart review, interview, psycho education, counseling, medical decision making, coordination of care and long-term prognosis.  Patient was given opportunity to ask question and all concerns and questions were addressed and answers. Excluding separately billable services.   Consent: Patient/Guardian gives verbal consent for treatment and assignment of benefits for services provided during this visit. Patient/Guardian expressed understanding and agreed to proceed.    Stasia Cavalier, MD 03/09/2023, 5:07 PM

## 2023-03-10 ENCOUNTER — Encounter (HOSPITAL_COMMUNITY): Payer: Self-pay | Admitting: Psychiatry

## 2023-03-10 ENCOUNTER — Encounter (HOSPITAL_COMMUNITY): Payer: Medicare HMO | Admitting: Physical Therapy

## 2023-03-22 ENCOUNTER — Ambulatory Visit (INDEPENDENT_AMBULATORY_CARE_PROVIDER_SITE_OTHER): Payer: Medicare HMO | Admitting: Licensed Clinical Social Worker

## 2023-03-22 DIAGNOSIS — F411 Generalized anxiety disorder: Secondary | ICD-10-CM | POA: Diagnosis not present

## 2023-03-22 DIAGNOSIS — F332 Major depressive disorder, recurrent severe without psychotic features: Secondary | ICD-10-CM

## 2023-03-22 NOTE — Progress Notes (Signed)
 THERAPIST PROGRESS NOTE   Session Time:  3:00pm - 4:15pm   Location: Patient: [facility]   Provider: [facility]     Participation Level: Active    Behavioral Response: Alert, casually dressed, depressed mood/affect   Type of Therapy:  Individual Therapy   Treatment Goals addressed: Mood management; Medication management; Safety planning   Progress Towards Goals: Progressing_   Interventions: CBT, psychoeducation on stages of grief, care coordination, safety planning    Summary: Jay [name] is a 73 year old married Caucasian male that presented for therapy appointment today with diagnoses of Major Depressive Disorder, recurrent, severe; and Generalized Anxiety Disorder.      Suicidal/Homicidal: None; without plan or intent                                                                                                                Therapist Response:  Clinician met with Jay [name] for in-person therapy session and assessed for safety, sobriety, and medication compliance.  Jay [name] presented for appointment on time and was alert, oriented x5, with no evidence or self-report of active SI/HI or A/V H.  Jay [name] reported ongoing compliance with medication and denied any use of alcohol or illicit substances.  Clinician inquired about Jay [name]'s emotional ratings today, as well as any significant changes in thoughts, feelings, or behavior since last check-in.  Jay [name] reported scores of 6/10 for depression, 6/10 for anxiety, and 6/10 for irritability.  Jay [name] denied any recent outbursts.  Jay [name] reported that a recent struggle has been having panic attacks off and on, and noted that he is nearing the anniversary of his mother's passing on March 15th.  Clinician expressed sympathy for Jay [name]'s loss, and provided him with psychoeducation on the 5 stages of grief, including denial, anger, bargaining, depression, and acceptance.  Clinician discussed how each stage can affect an individual, and  provided strategies on how to faciliate healthy grieving as he continues processing this loss. Strategies provided to Jay [name] included taking time to allow healthy emotional expression (I.e. allowing oneself to cry when appropriate), engaging in healthy self-care activities for distraction, talking to people who can relate to the loss for support, and considering ways to memorialize the deceased in a meaningful way.  Interventions were effective, as evidenced by Jay [name] active engagement in discussion on subject, and ability to relate to each stage.  Jay [name] reported that when his mother passed away, he was in denial for several months and fell into a depression where he isolated and did not want to be around people.  He reported that he was also angry at his mother for 'giving up', and bargained for her to stay longer.  Jay [name] reported that he feels closer to acceptance with the passage of time, but will need to stay connected to his support system, including his wife and church.  Clinician observed Jay [name] lose balance and fall in between the couch and side table when attempting to sit down at start of session.  Jay [name] reported that he felt dizzy when trying  to sit down and experienced muscle weakness, which has happened several times in the past.  Clinician physically assisted Jay [name] in getting back to seated position and recommended that he be assessed by medical staff.  Jay [name] finally agreed to this at conclusion of session, and had blood pressure and vitals checked by Jay [name], CMA and Dr. Mercy Riding.  Jay [name] sustained a small cut on his hand from the fall, but declined having this treated, noting that it stopped bleeding shortly after applying tissue.  Jay [name] reported that he would plan to meet with a neurologist as soon as possible, and consider using a cane to help with balance in the meantime.  Jay [name]'s wife, Jay [name], was also brought in for meeting to update on condition, agreed to monitor  him after leaving the office and take him to the [name] immediately if condition changes. Clinician will continue to monitor.          Plan: Follow up again in 2 weeks.    Diagnosis: Major Depressive Disorder, recurrent, severe; and Generalized Anxiety Disorder.  Collaboration of Care:   Jay [name], CMA and Dr. Mercy Riding.                                                   Patient/Guardian was advised Release of Information must be obtained prior to any record release in order to collaborate their care with an outside provider. Patient/Guardian was advised if they have not already done so to contact the registration department to sign all necessary forms in order for [place] to release information regarding their care.    Consent: Patient/Guardian gives verbal consent for treatment and assignment of benefits for services provided during this visit. Patient/Guardian expressed understanding and agreed to proceed.  Noralee Stain, LCSW, LCAS 03/22/23

## 2023-03-23 ENCOUNTER — Telehealth (HOSPITAL_COMMUNITY): Payer: Self-pay

## 2023-03-23 NOTE — Telephone Encounter (Signed)
 Follow-up - Call with patient to check on his status after fall he had in therapist office the previous day. Patient stated "I am fine" and denied any concerns or issues after the fall. Patient stated the cut on his had was "fine too" but did agree a neurology referral had been made for patient.  Patient to let us know if he does not get a call to arrange this follow-up appointment or if any issues following fall.

## 2023-03-24 IMAGING — US US ABDOMINAL AORTA SCREENING AAA
1 series · 14 of 14 positions shown · non-contrast
Comparison: None.

CLINICAL DATA: Screening for abdominal aortic aneurysm. History of
smoking.

EXAM:
US ABDOMINAL AORTA MEDICARE SCREENING
TECHNIQUE: Ultrasound examination of the abdominal aorta was performed as a
screening evaluation for abdominal aortic aneurysm.

[Series 1: us abdominal aorta screening aaa · 0.28mm/px · 14 of 14 slices shown]
[im 1/14]
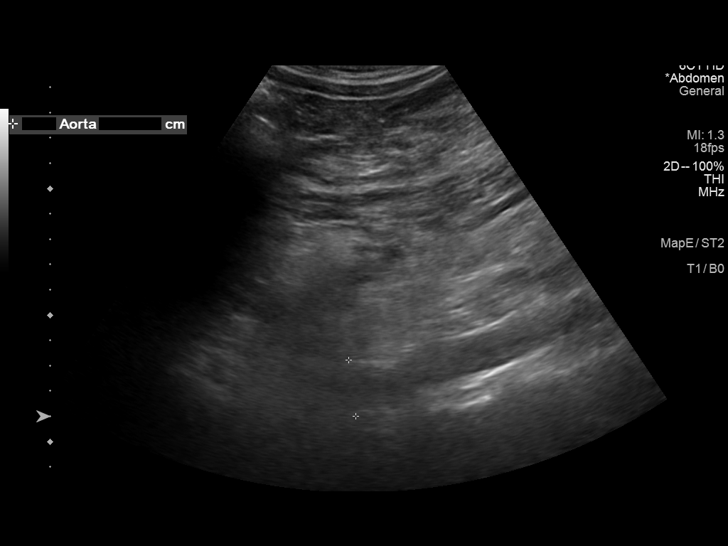
[im 2/14]
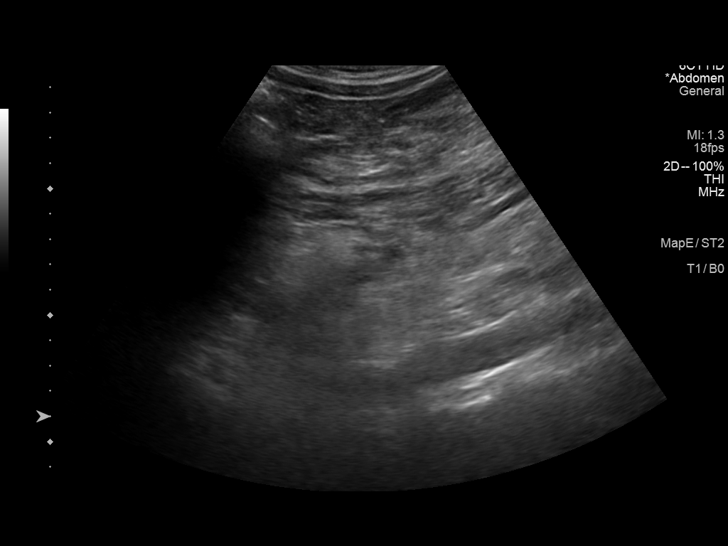
[im 3/14]
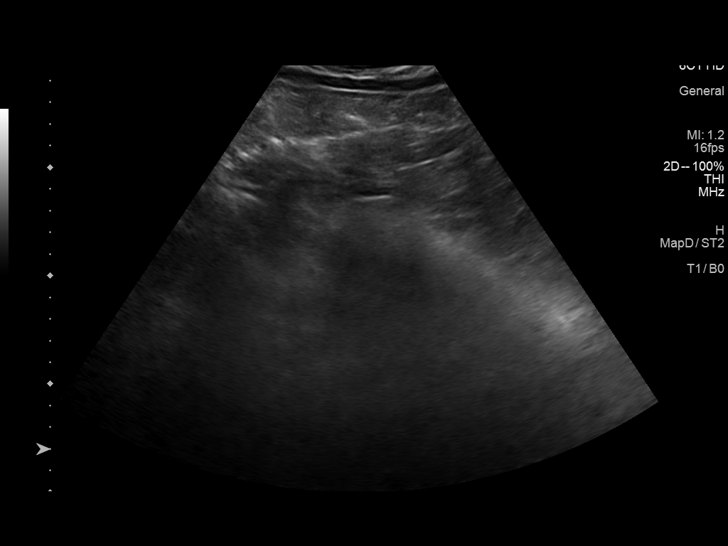
[im 4/14]
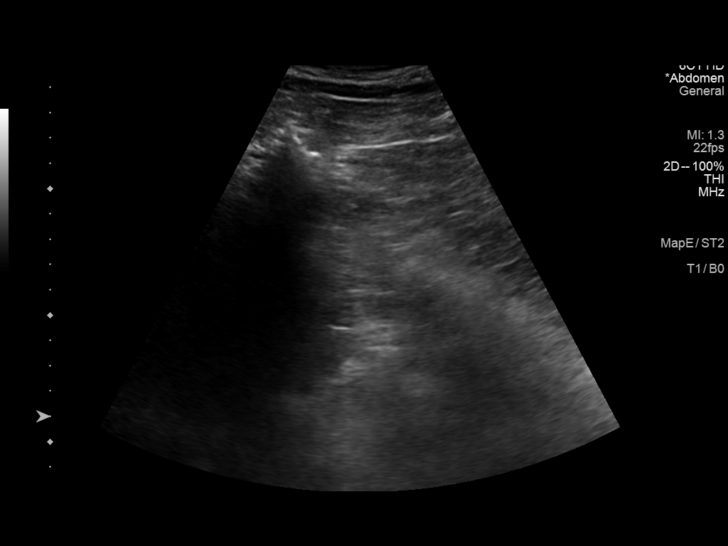
[im 5/14]
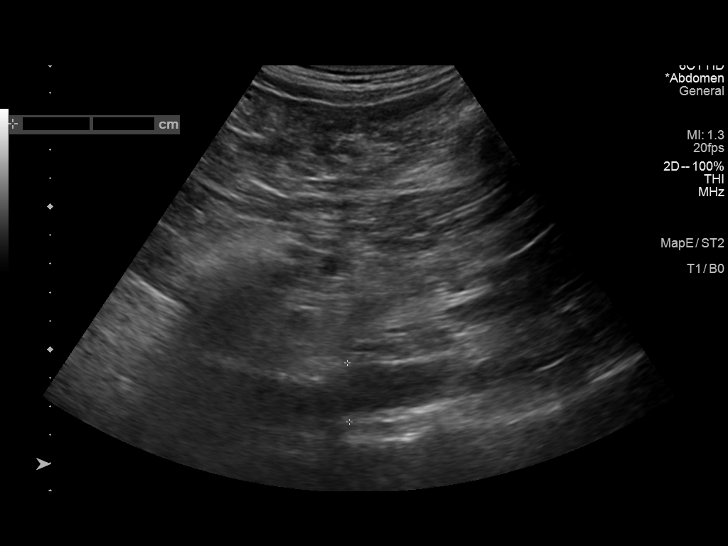
[im 6/14]
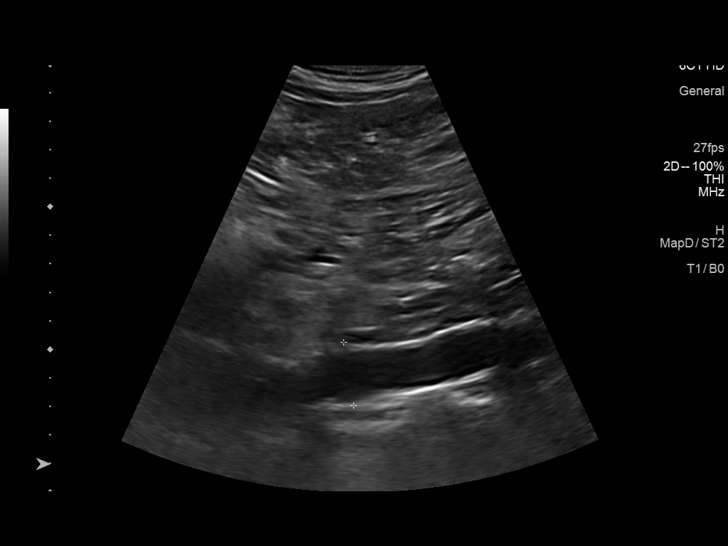
[im 7/14]
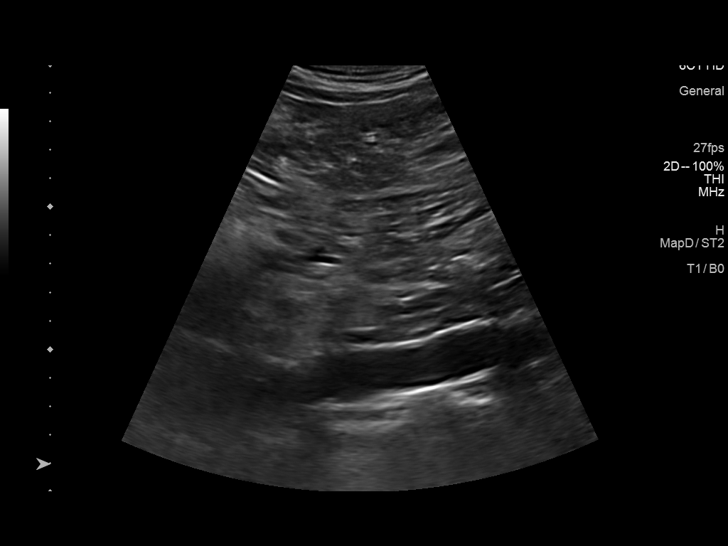
[im 8/14]
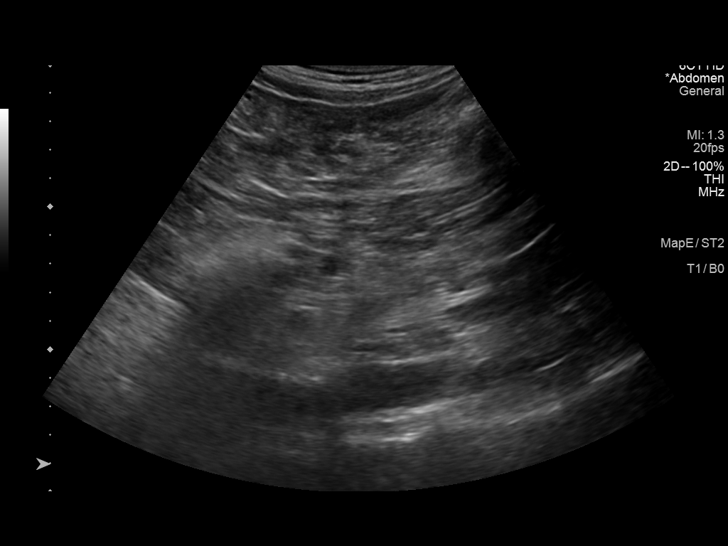
[im 9/14]
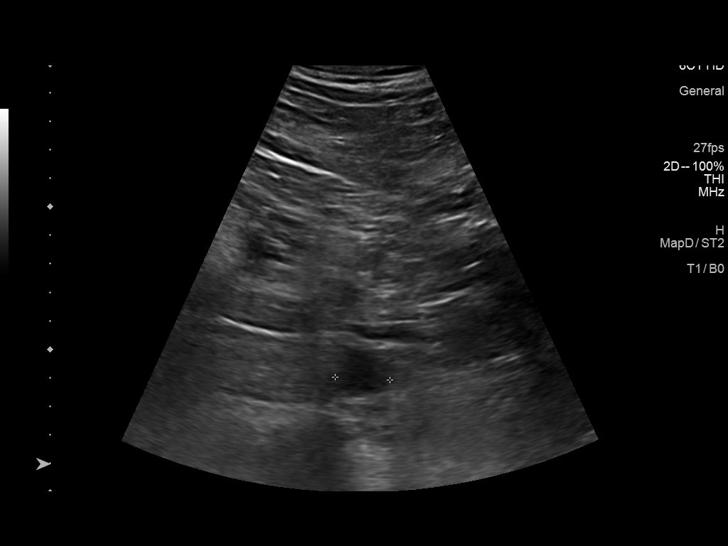
[im 10/14]
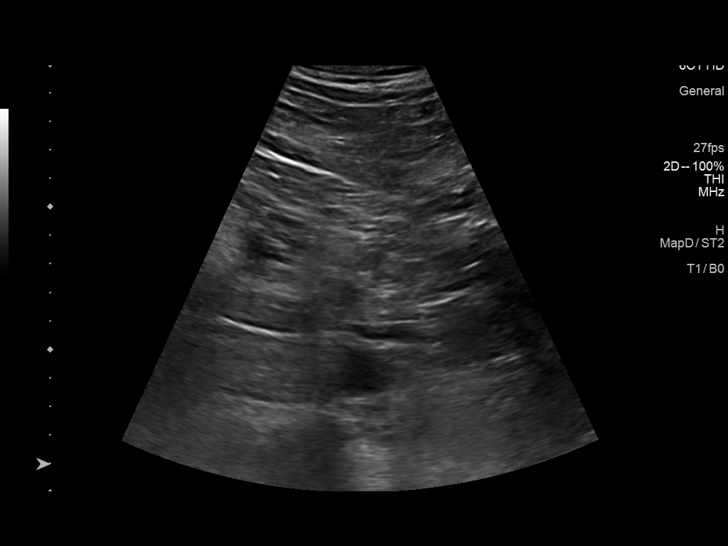
[im 11/14]
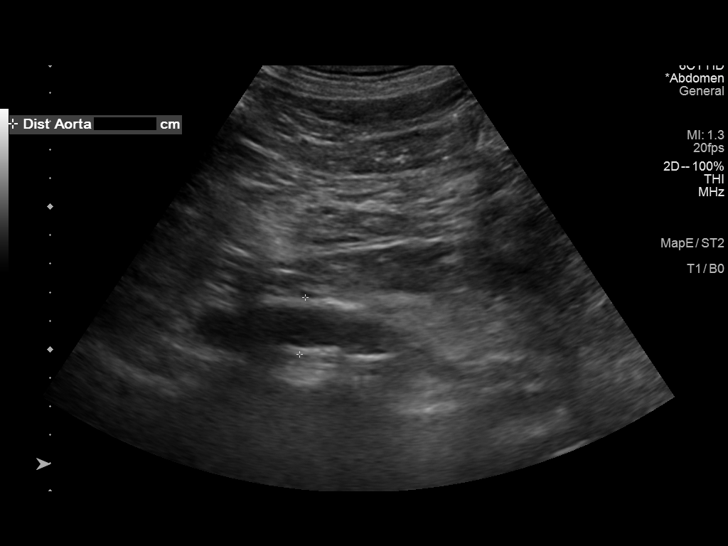
[im 12/14]
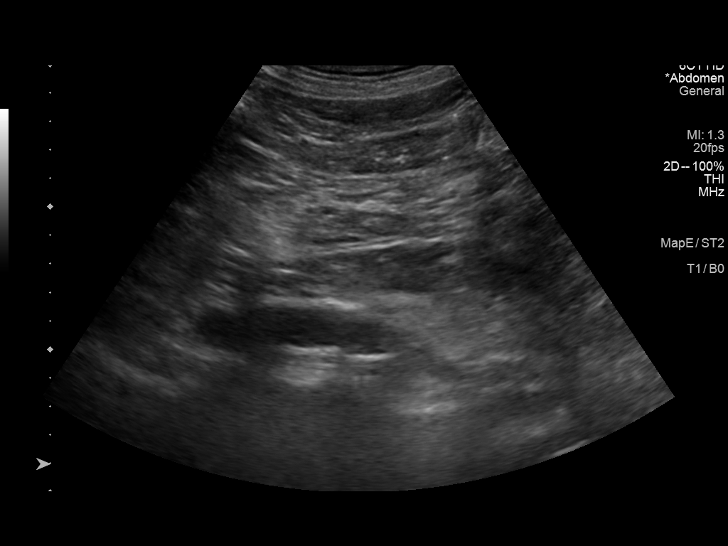
[im 13/14]
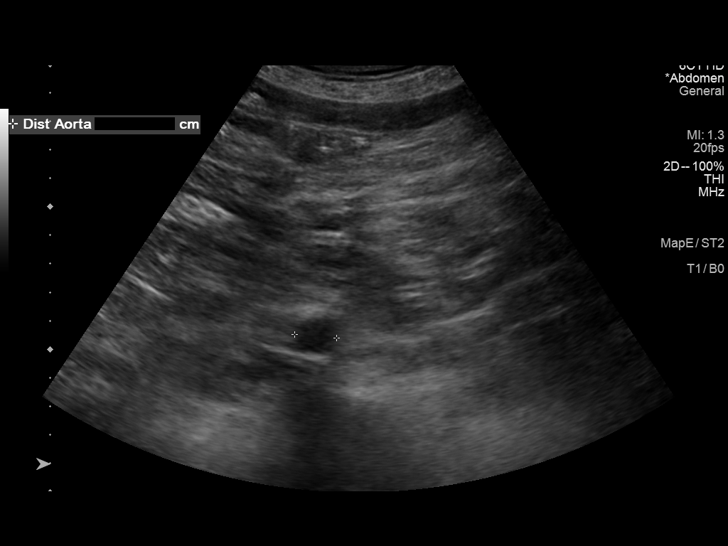
[im 14/14]
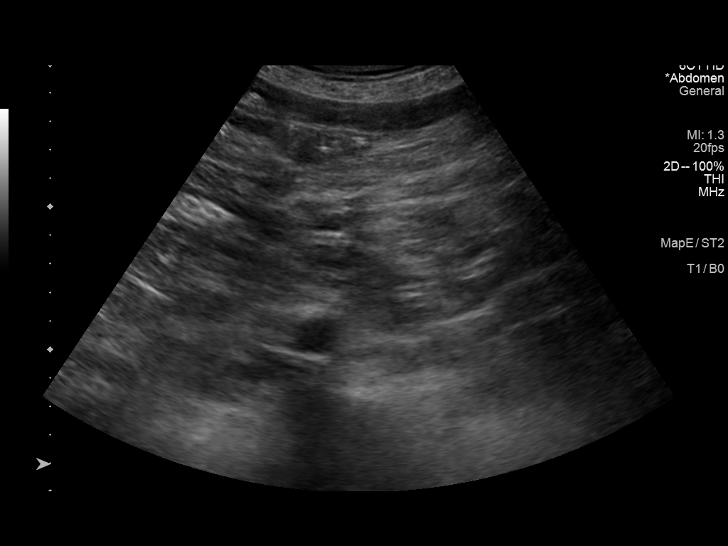

[14 of 14 positions shown; findings below may reference images not displayed]

FINDINGS: Abdominal aortic measurements as follows:

Proximal: 2.2 cm in AP diameter. Nonvisualization of the transverse
diameter due to bowel gas and limited sonographic window from
patient body habitus.

Mid: 2.2 cm in AP diameter.  1.9 cm in transverse diameter.

Distal:  2.0 cm in AP diameter.  1.5 cm in transverse diameter.
IMPRESSION: No visible aortic aneurysm with limited visualization of the
proximal abdominal aorta, as detailed above.

## 2023-03-30 ENCOUNTER — Ambulatory Visit (HOSPITAL_COMMUNITY): Payer: Medicare HMO

## 2023-04-12 NOTE — Progress Notes (Unsigned)
 BH MD/PA/NP OP Progress Note  04/12/2023 1:45 PM [name]  MRN:  [id number]  Visit Diagnosis:  No diagnosis found.  Assessment: Jay [name] is a 73 y.o. male with a history of MDD, GAD, COPD, HTN,  who presented to [facility] at [facility] for initial evaluation on 04/13/2022.    At initial evaluation patient reported neurovegetative symptoms of depression including low mood, anhedonia, amotivation, poor sleep, increased appetite, excessive tearfulness, fatigue, poor concentration, and intermittent passive SI.  He denied any passive SI at this time reporting that there is only been 2 episodes since 2022.  He denied any intent or plan since around 2014.  Safety planning and crisis resources were discussed. In addition to depression patient also reported significant anxiety.  He endorsed symptoms of constant worry that he is unable to control, difficulty relaxing, restlessness, increased irritability, and fear of something awful happening.  Patient did endorse having episodes of panic where he is short of breath, restless, diaphoretic, and can get muscle rigidity.  These were more frequent while on Wellbutrin though still occur occasionally since discontinuing.  Patient met criteria for MDD and GAD.  [name] presents for follow-up evaluation. Today, 04/12/23, patient reports    some gradual improvement in mood and sleep in the interim.  There is also been no notable change in irritability.  We will titrate Wellbutrin to 450 mg daily and reviewed the risks and benefits. Will continue on the remainder of his current regimen and follow up in a month.  Psychotherapeutic interventions were used during today's session. From 2:35 PM to 2:56 PM we used empathic listening techniques and provided support. Worked on cognitive re framing techniques and focusing on behavioral activation securely and its benefit for depression and pain symptoms.  Improvement was evidenced by  patient's participation.  Plan: - Continue Cymbalta 60 mg BID for depressed mood/anxiety - Continue Latuda 40 mg w/dinner for depressed mood - Increase Wellbutrin XL to 450 mg daily - Continue Donepezil 10 mg QD, managed by PCP - CMP, CBC, Vitamin D,  - Continue therapy with Kandee Keen - Crisis resources reviewed - Follow up in a month  Chief Complaint:  No chief complaint on file.  HPI: [name] presents reporting that    he has noticed some gradual improvement over the last couple months.  With the taper off of Lamictal and up on Wellbutrin he has found that his thought process has started to improve.  He has less negative self thoughts and is able to move past things that bothered him in the past.  Furthermore he is sleeping better at night typically getting around 6 to 7 hours of sleep and feeling well rested the next day.  There has not been significant improvement in his energy or motivation symptoms.  Patient still spends most of his time on the couch during the days.  He expresses some desire to engage in other activities but is limited by the back pain and the fatigue.  Patient's wife joined the session who confirmed Brahim's report.  She also mentioned that there has been no increase or change in irritability with the tapering of Lamictal.  The only period of irritability she tends to notice is when she pushes him to eat and he is not hungry.  We worked some on cognitive reframing and encouraged behavioral activation.  Education was provided how gradual progression in activity/movement can in fact improve pain symptoms over time.  Furthermore the fatigue he is currently  experiencing would likely improve from this as well.  Discussed getting some blood work to check for potential causes of fatigue such as low vitamin D or testosterone levels.  We will also repeat CMP and CBC monitoring GFR.  If it remains low we may want to consider alternatives to duloxetine.  We will titrate Wellbutrin  today and reviewed the risk and benefits.  Of note patient is not taking memantine or Eliquis.  The memantine appears to be due to a pharmacy issue while the Eliquis was cost prohibitive.  We recommended that the patient discuss this with his PCP. Particularly alternative blood thinning options as he might need to be on something to decrease the risk for stroke.  Past Psychiatric History: Had seen a psychiatrist 2009, no prior psychiatric hospitalizations, no prior suicide attempts.  He denies ever being connected with a therapist.  Has tried Wellbutrin, Xanax, Lamictal, Restoril in the past  Denies current substance. Alcohol and marijuana 20-30 years ago.  Past Medical History:  Past Medical History:  Diagnosis Date  . Anxiety   . Arthritis   . COPD (chronic obstructive pulmonary disease) (HCC)   . Depression   . Dysrhythmia   . GERD (gastroesophageal reflux disease)   . Hypertension   . PAF (paroxysmal atrial fibrillation) (HCC)   . Shortness of breath    with excertion    Past Surgical History:  Procedure Laterality Date  . APPENDECTOMY    . COLONOSCOPY WITH PROPOFOL N/A 03/25/2022   Procedure: COLONOSCOPY WITH PROPOFOL;  Surgeon: Corbin Ade, MD;  Location: [facility];  Service: Endoscopy;  Laterality: N/A;  9:00 am  . EYE SURGERY    . POLYPECTOMY  03/25/2022   Procedure: POLYPECTOMY INTESTINAL;  Surgeon: Corbin Ade, MD;  Location: [facility];  Service: Endoscopy;;    Family History:  Family History  Problem Relation Age of Onset  . Hypertension Mother   . Cancer Father   . Hypertension Father   . Hypertension Sister   . Cancer Brother   . Hypertension Brother   . Cancer - Colon Neg Hx   . Colon polyps Neg Hx     Social History:  Social History   Socioeconomic History  . Marital status: Married    Spouse name: Not on file  . Number of children: Not on file  . Years of education: Not on file  . Highest education level: Not on file  Occupational  History  . Not on file  Tobacco Use  . Smoking status: Former    Current packs/day: 0.00    Types: Cigarettes    Quit date: 01/12/1993    Years since quitting: 30.2  . Smokeless tobacco: Never  Vaping Use  . Vaping status: Never Used  Substance and Sexual Activity  . Alcohol use: No    Comment: Quit 1995  . Drug use: No  . Sexual activity: Not on file  Other Topics Concern  . Not on file  Social History Narrative  . Not on file   Social Drivers of Health   Financial Resource Strain: Low Risk  (07/03/2022)   Overall Financial Resource Strain (CARDIA)   . Difficulty of Paying Living Expenses: Not hard at all  Food Insecurity: No Food Insecurity (07/03/2022)   Hunger Vital Sign   . Worried About Programme researcher, broadcasting/film/video in the Last Year: Never true   . Ran Out of Food in the Last Year: Never true  Transportation Needs: No Transportation Needs (  07/03/2022)   PRAPARE - Transportation   . Lack of Transportation (Medical): No   . Lack of Transportation (Non-Medical): No  Physical Activity: Inactive (07/03/2022)   Exercise Vital Sign   . Days of Exercise per Week: 0 days   . Minutes of Exercise per Session: 0 min  Stress: No Stress Concern Present (07/03/2022)   [employer] of Occupational Health - Occupational Stress Questionnaire   . Feeling of Stress : Not at all  Social Connections: Socially Integrated (07/03/2022)   Social Connection and Isolation Panel [NHANES]   . Frequency of Communication with Friends and Family: More than three times a week   . Frequency of Social Gatherings with Friends and Family: More than three times a week   . Attends Religious Services: More than 4 times per year   . Active Member of Clubs or Organizations: Yes   . Attends Banker Meetings: More than 4 times per year   . Marital Status: Married    Allergies:  No Active Allergies   Current Medications: Current Outpatient Medications  Medication Sig Dispense Refill  .  acetaminophen (TYLENOL) 500 MG tablet Take 1,000 mg by mouth every 6 (six) hours as needed for moderate pain.    Marland Kitchen albuterol (VENTOLIN HFA) 108 (90 Base) MCG/ACT inhaler Inhale 2 puffs into the lungs every 4 (four) hours as needed for wheezing or shortness of breath.    Marland Kitchen amLODipine (NORVASC) 5 MG tablet Take 1 tablet (5 mg total) by mouth every morning. 90 tablet 1  . apixaban (ELIQUIS) 5 MG TABS tablet Take 1 tablet (5 mg total) by mouth 2 (two) times daily.    Marland Kitchen atorvastatin (LIPITOR) 40 MG tablet Take 1 tablet (40 mg total) by mouth daily. 90 tablet 3  . buPROPion (WELLBUTRIN XL) 150 MG 24 hr tablet Take 3 tablets (450 mg total) by mouth every morning. 90 tablet 2  . clotrimazole-betamethasone (LOTRISONE) cream Apply 1 Application topically 2 (two) times daily. 60 g 1  . diphenhydrAMINE (BENADRYL) 25 MG tablet Take 25 mg by mouth daily.    Marland Kitchen donepezil (ARICEPT) 10 MG tablet TAKE ONE TABLET BY MOUTH EVERYDAY AT BEDTIME 90 tablet 3  . DULoxetine (CYMBALTA) 60 MG capsule Take 1 capsule (60 mg total) by mouth 2 (two) times daily. 180 capsule 0  . esomeprazole (NEXIUM) 40 MG capsule Take 1 capsule by mouth in the morning 90 capsule 3  . famotidine (PEPCID) 20 MG tablet Take 1 tablet by mouth once daily 90 tablet 1  . finasteride (PROSCAR) 5 MG tablet Take 1 tablet (5 mg total) by mouth daily. 90 tablet 1  . Fluticasone-Umeclidin-Vilant (TRELEGY ELLIPTA) 100-62.5-25 MCG/ACT AEPB Inhale 1 puff into the lungs daily. 60 each 5  . lurasidone (LATUDA) 40 MG TABS tablet Take 1 tablet (40 mg total) by mouth at bedtime. 90 tablet 0  . memantine (NAMENDA) 10 MG tablet Take 1 tablet (10 mg total) by mouth 2 (two) times daily. 180 tablet 3  . metoprolol tartrate (LOPRESSOR) 50 MG tablet Take 1 tablet (50 mg total) by mouth 2 (two) times daily. 90 tablet 3  . Na Sulfate-K Sulfate-Mg Sulf 17.5-3.13-1.6 GM/177ML SOLN As directed 354 mL 0  . tamsulosin (FLOMAX) 0.4 MG CAPS capsule Take 1 capsule (0.4 mg total) by  mouth daily. 90 capsule 3  . traMADol (ULTRAM) 50 MG tablet Take 1 tablet (50 mg total) by mouth every 6 (six) hours as needed for severe pain. 30 tablet 2  .  triamcinolone cream (KENALOG) 0.1 % Apply 1 Application topically daily as needed (rash). 30 g 2   No current facility-administered medications for this visit.     Musculoskeletal: Strength & Muscle Tone: within normal limits Gait & Station: normal Patient leans: N/A  Psychiatric Specialty Exam: Review of Systems  There were no vitals taken for this visit.There is no height or weight on file to calculate BMI.  General Appearance: Fairly Groomed  Eye Contact:  Good  Speech:  Clear and Coherent and Normal Rate  Volume:  Normal  Mood:  Depressed  Affect:  Appropriate  Thought Process:  Coherent  Orientation:  Full (Time, Place, and Person)  Thought Content: Logical and Rumination   Suicidal Thoughts:  No  Homicidal Thoughts:  No  Memory:  Immediate;   Good  Judgement:  Fair  Insight:  Fair  Psychomotor Activity:  Normal  Concentration:  Concentration: Fair  Recall:  Poor  Fund of Knowledge: Fair  Language: Good  Akathisia:  No    AIMS (if indicated): done  Assets:  Communication Skills Desire for Improvement Social Support Transportation  ADL's:  Intact  Cognition: WNL  Sleep:  Good   Metabolic Disorder Labs: Lab Results  Component Value Date   HGBA1C 5.7 (A) 01/08/2023   No results found for: "PROLACTIN" Lab Results  Component Value Date   CHOL 201 (H) 01/08/2023   TRIG 121 01/08/2023   HDL 50 01/08/2023   CHOLHDL 4.0 01/08/2023   VLDL 16.1 07/08/2022   LDLCALC 129 (H) 01/08/2023   LDLCALC 115 (H) 07/08/2022   Lab Results  Component Value Date   TSH 2.77 07/08/2022   TSH 1.821 05/02/2021    Therapeutic Level Labs: No results found for: "LITHIUM" No results found for: "VALPROATE" No results found for: "CBMZ"   Screenings: GAD-7    Flowsheet Row Counselor from 02/16/2023 in [facility] Visit from 07/08/2022 in [facility] [place] [facility] Visit from [facility]/13/2022 [facility]  Total GAD-7 Score 14 13 20       PHQ2-9    Flowsheet Row Counselor from 02/16/2023 in [facility] Visit from 01/08/2023 in [facility] [place] Office Visit from 07/08/2022 in [facility] [place] Clinical Support from 07/03/2022 in [facility] [place] Office Visit from 04/13/2022 in [facility]  PHQ-2 Total Score 6 6 6  0 6  PHQ-9 Total Score 20 24 21  -- 22      Flowsheet Row Counselor from 02/16/2023 in [facility] Visit from 04/13/2022 in [facility] Admission (Discharged) from 03/25/2022 in [facility]  C-SSRS RISK CATEGORY No Risk Low Risk No Risk       Collaboration of Care: Collaboration of Care: Medication Management AEB medication prescription, Primary Care Provider AEB chart review, and Referral or follow-up with counselor/therapist AEB chart review  Patient/Guardian was advised Release of Information must be obtained prior to any record release in order to collaborate their care with an outside provider. Patient/Guardian was advised if they have not already done so to contact the registration department to sign all necessary forms in order for [place] to release information regarding their care.     Virtual Visit via Video Note  I connected with Carola Frost on 04/13/23 at  3:30 PM EDT by a video enabled telemedicine  application and verified that I am speaking with the correct person using two identifiers.  Location: Patient: Home Provider: Home Office   I discussed the limitations of evaluation and management by telemedicine and the  availability of in person appointments. The patient expressed understanding and agreed to proceed.   I discussed the assessment and treatment plan with the patient. The patient was provided an opportunity to ask questions and all were answered. The patient agreed with the plan and demonstrated an understanding of the instructions.   The patient was advised to call back or seek an in-person evaluation if the symptoms worsen or if the condition fails to improve as anticipated.  I provided *** minutes of non-face-to-face time during this encounter.  Consent: Patient/Guardian gives verbal consent for treatment and assignment of benefits for services provided during this visit. Patient/Guardian expressed understanding and agreed to proceed.    Stasia Cavalier, MD 04/12/2023, 1:45 PM

## 2023-04-13 ENCOUNTER — Encounter (HOSPITAL_COMMUNITY): Payer: Self-pay

## 2023-04-13 ENCOUNTER — Ambulatory Visit (HOSPITAL_COMMUNITY)

## 2023-04-13 ENCOUNTER — Encounter (HOSPITAL_COMMUNITY): Payer: Medicare HMO | Admitting: Psychiatry

## 2023-04-13 ENCOUNTER — Ambulatory Visit (HOSPITAL_COMMUNITY): Payer: Medicare HMO | Admitting: Psychiatry

## 2023-04-13 NOTE — Progress Notes (Signed)
 This encounter was created in error - please disregard.

## 2023-04-14 ENCOUNTER — Ambulatory Visit (HOSPITAL_COMMUNITY)

## 2023-04-15 ENCOUNTER — Ambulatory Visit (HOSPITAL_COMMUNITY)

## 2023-04-26 ENCOUNTER — Other Ambulatory Visit: Payer: Self-pay | Admitting: Adult Health

## 2023-04-29 ENCOUNTER — Encounter (HOSPITAL_COMMUNITY): Payer: Self-pay

## 2023-04-29 ENCOUNTER — Ambulatory Visit (HOSPITAL_COMMUNITY): Admitting: Licensed Clinical Social Worker

## 2023-04-29 ENCOUNTER — Telehealth (INDEPENDENT_AMBULATORY_CARE_PROVIDER_SITE_OTHER): Admitting: Licensed Clinical Social Worker

## 2023-04-29 NOTE — Telephone Encounter (Signed)
 Jay [name] had a virtual therapy appointment scheduled today for 2pm. Clinician outreached [name] by phone at 2:10pm when he did not appear on time.  [name] did not answer this call, and a voicemail could not be left.  Clinician informed front desk staff of no-show event when [name] did not log into session by 2:15pm.    Desmond Florida, LCSW, LCAS 04/29/23

## 2023-05-03 NOTE — Progress Notes (Signed)
 BH MD/PA/NP OP Progress Note  05/04/2023 4:32 PM Jay [name]  MRN:  [id number]  Visit Diagnosis:    ICD-10-CM   1. Severe episode of recurrent major depressive disorder, without psychotic features (HCC)  F33.2     2. GAD (generalized anxiety disorder)  F41.1 lurasidone  (LATUDA ) 40 MG TABS tablet    DULoxetine  (CYMBALTA ) 60 MG capsule    3. Moderate episode of recurrent major depressive disorder (HCC)  F33.1 lurasidone  (LATUDA ) 40 MG TABS tablet    buPROPion  (WELLBUTRIN  XL) 150 MG 24 hr tablet    DULoxetine  (CYMBALTA ) 60 MG capsule    4. Encounter for long-term (current) use of medications  Z79.899 buPROPion  (WELLBUTRIN  XL) 150 MG 24 hr tablet      Assessment: Jay [name] is a 73 y.o. male with a history of MDD, GAD, COPD, HTN,  who presented to [facility] for initial evaluation on 04/13/2022.    At initial evaluation patient reported neurovegetative symptoms of depression including low mood, anhedonia, amotivation, poor sleep, increased appetite, excessive tearfulness, fatigue, poor concentration, and intermittent passive SI.  He denied any passive SI at this time reporting that there is only been 2 episodes since 2022.  He denied any intent or plan since around 2014.  Safety planning and crisis resources were discussed. In addition to depression patient also reported significant anxiety.  He endorsed symptoms of constant worry that he is unable to control, difficulty relaxing, restlessness, increased irritability, and fear of something awful happening.  Patient did endorse having episodes of panic where he is short of breath, restless, diaphoretic, and can get muscle rigidity.  These were more frequent while on Wellbutrin  though still occur occasionally since discontinuing.  Patient met criteria for MDD and GAD.  Jay [name] presents for follow-up evaluation. Today, 05/04/23, patient reports no significant improvement in the interim.  However  upon review he has had some minor increases in behavioral activation.  He also has longer-lasting improvement in mood lability, irritability, and tearfulness.  Patient was encouraged to identify his victories and not to minimize them.  Improvement was noted to begin roughly around the time to bupropion  fully entered his system.  Would be appropriate to continue on his current regimen and follow up in a month.  Psychotherapeutic interventions were used during today's session. From 4:05 PM to 4:30 PM we used empathic listening techniques and provided support. Worked on cognitive re framing techniques and focusing on behavioral activation techniques.  Improvement was evidenced by patient's participation.  Plan: - Continue Cymbalta  60 mg BID for depressed mood/anxiety - Continue Latuda  40 mg w/dinner for depressed mood - Continue Wellbutrin  XL to 450 mg daily - Continue Donepezil  10 mg QD, managed by PCP - CMP, CBC, Vitamin D,  - Continue therapy with Jay [name] - Crisis resources reviewed - Follow up in a month  Chief Complaint:  Chief Complaint  Patient presents with   Follow-up   HPI: Jay [name] presents reporting that over the last 2 months he has done very little.  That said he does report that the last 2 weeks he has done more than he has in a long time.  He has helped around the house with the dishes and vacuuming more so than he has in the past.  We encouraged Jay [name] to acknowledge the improvement and not minimize his achievements.  In that being he was upset with himself as he had to sit down to do the dishes we reminded him that  the simple act of getting up and doing things as already a step forward.  With the chronic pain symptoms that might need to be adaptations to the way things have been done in the past.  Overall he feels like his mood symptoms are about 70% better than they had been in the past.  Since starting the medication he is no longer experiencing the increased irritability or  episodic tearfulness that he previously had.  While the motivation has not improved to a level that he is happy with it is still better than it was previously.  We did review some behavioral activation techniques and strategies to increase his activity that would be able to accommodate his physical limitations.  One thing Jay [name] is liked is racing and cars and thus we talked about the possibility of getting involved in RC cars.  Past Psychiatric History: Had seen a psychiatrist 2009, no prior psychiatric hospitalizations, no prior suicide attempts.  He denies ever being connected with a therapist.  Has tried Wellbutrin , Xanax , Lamictal , Restoril in the past  Denies current substance. Alcohol and marijuana 20-30 years ago.  Past Medical History:  Past Medical History:  Diagnosis Date   Anxiety    Arthritis    COPD (chronic obstructive pulmonary disease) (HCC)    Depression    Dysrhythmia    GERD (gastroesophageal reflux disease)    Hypertension    PAF (paroxysmal atrial fibrillation) (HCC)    Shortness of breath    with excertion    Past Surgical History:  Procedure Laterality Date   APPENDECTOMY     COLONOSCOPY WITH PROPOFOL  N/A 03/25/2022   Procedure: COLONOSCOPY WITH PROPOFOL ;  Surgeon: Suzette Espy, MD;  [facility];  Service: Endoscopy;  Laterality: N/A;  9:00 am   EYE SURGERY     POLYPECTOMY  03/25/2022   Procedure: POLYPECTOMY INTESTINAL;  Surgeon: Suzette Espy, MD;  Location: [facility];  Service: Endoscopy;;    Family History:  Family History  Problem Relation Age of Onset   Hypertension Mother    Cancer Father    Hypertension Father    Hypertension Sister    Cancer Brother    Hypertension Brother    Cancer - Colon Neg Hx    Colon polyps Neg Hx     Social History:  Social History   Socioeconomic History   Marital status: Married    Spouse name: Not on file   Number of children: Not on file   Years of education: Not on file   Highest  education level: Not on file  Occupational History   Not on file  Tobacco Use   Smoking status: Former    Current packs/day: 0.00    Types: Cigarettes    Quit date: 01/12/1993    Years since quitting: 30.3   Smokeless tobacco: Never  Vaping Use   Vaping status: Never Used  Substance and Sexual Activity   Alcohol use: No    Comment: Quit 1995   Drug use: No   Sexual activity: Not on file  Other Topics Concern   Not on file  Social History Narrative   Not on file   Social Drivers of Health   Financial Resource Strain: Low Risk  (07/03/2022)   Overall Financial Resource Strain (CARDIA)    Difficulty of Paying Living Expenses: Not hard at all  Food Insecurity: No Food Insecurity (07/03/2022)   Hunger Vital Sign    Worried About Running Out of Food in the  Last Year: Never true    Ran Out of Food in the Last Year: Never true  Transportation Needs: No Transportation Needs (07/03/2022)   PRAPARE - Administrator, Civil Service (Medical): No    Lack of Transportation (Non-Medical): No  Physical Activity: Inactive (07/03/2022)   Exercise Vital Sign    Days of Exercise per Week: 0 days    Minutes of Exercise per Session: 0 min  Stress: No Stress Concern Present (07/03/2022)   [employer] of Occupational Health - Occupational Stress Questionnaire    Feeling of Stress : Not at all  Social Connections: Socially Integrated (07/03/2022)   Social Connection and Isolation Panel [NHANES]    Frequency of Communication with Friends and Family: More than three times a week    Frequency of Social Gatherings with Friends and Family: More than three times a week    Attends Religious Services: More than 4 times per year    Active Member of [employer] or Organizations: Yes    Attends Engineer, structural: More than 4 times per year    Marital Status: Married    Allergies:  No Active Allergies   Current Medications: Current Outpatient Medications  Medication Sig Dispense  Refill   acetaminophen  (TYLENOL ) 500 MG tablet Take 1,000 mg by mouth every 6 (six) hours as needed for moderate pain.     albuterol  (VENTOLIN  HFA) 108 (90 Base) MCG/ACT inhaler Inhale 2 puffs into the lungs every 4 (four) hours as needed for wheezing or shortness of breath.     amLODipine  (NORVASC ) 5 MG tablet Take 1 tablet (5 mg total) by mouth every morning. 90 tablet 1   apixaban  (ELIQUIS ) 5 MG TABS tablet Take 1 tablet (5 mg total) by mouth 2 (two) times daily.     atorvastatin  (LIPITOR) 40 MG tablet Take 1 tablet (40 mg total) by mouth daily. 90 tablet 3   clotrimazole -betamethasone  (LOTRISONE ) cream Apply 1 Application topically 2 (two) times daily. 60 g 1   diphenhydrAMINE (BENADRYL) 25 MG tablet Take 25 mg by mouth daily.     donepezil  (ARICEPT ) 10 MG tablet TAKE ONE TABLET BY MOUTH EVERYDAY AT BEDTIME 90 tablet 3   esomeprazole  (NEXIUM ) 40 MG capsule Take 1 capsule by mouth in the morning 90 capsule 3   famotidine  (PEPCID ) 20 MG tablet Take 1 tablet by mouth once daily 90 tablet 0   finasteride  (PROSCAR ) 5 MG tablet Take 1 tablet (5 mg total) by mouth daily. 90 tablet 1   Fluticasone-Umeclidin-Vilant (TRELEGY ELLIPTA ) 100-62.5-25 MCG/ACT AEPB Inhale 1 puff into the lungs daily. 60 each 5   memantine  (NAMENDA ) 10 MG tablet Take 1 tablet (10 mg total) by mouth 2 (two) times daily. 180 tablet 3   Na Sulfate-K Sulfate-Mg Sulf 17.5-3.13-1.6 GM/177ML SOLN As directed 354 mL 0   tamsulosin  (FLOMAX ) 0.4 MG CAPS capsule Take 1 capsule (0.4 mg total) by mouth daily. 90 capsule 3   traMADol  (ULTRAM ) 50 MG tablet Take 1 tablet (50 mg total) by mouth every 6 (six) hours as needed for severe pain. 30 tablet 2   triamcinolone  cream (KENALOG ) 0.1 % Apply 1 Application topically daily as needed (rash). 30 g 2   buPROPion  (WELLBUTRIN  XL) 150 MG 24 hr tablet Take 3 tablets (450 mg total) by mouth every morning. 90 tablet 2   DULoxetine  (CYMBALTA ) 60 MG capsule Take 1 capsule (60 mg total) by mouth 2 (two)  times daily. 180 capsule 0   lurasidone  (LATUDA ) 40  MG TABS tablet Take 1 tablet (40 mg total) by mouth at bedtime. 90 tablet 0   metoprolol  tartrate (LOPRESSOR ) 50 MG tablet Take 1 tablet (50 mg total) by mouth 2 (two) times daily. 90 tablet 3   No current facility-administered medications for this visit.     Musculoskeletal: Strength & Muscle Tone: within normal limits Gait & Station: normal Patient leans: N/A  Psychiatric Specialty Exam: Review of Systems  Height 5' 8.5" (1.74 m), weight 202 lb (91.6 kg).Body mass index is 30.27 kg/m.  General Appearance: Fairly Groomed  Eye Contact:  Good  Speech:  Clear and Coherent and Normal Rate  Volume:  Normal  Mood:  Depressed  Affect:  Appropriate  Thought Process:  Coherent  Orientation:  Full (Time, Place, and Person)  Thought Content: Logical and Rumination   Suicidal Thoughts:  No  Homicidal Thoughts:  No  Memory:  Immediate;   Good  Judgement:  Fair  Insight:  Fair  Psychomotor Activity:  Normal  Concentration:  Concentration: Fair  Recall:  Poor  Fund of Knowledge: Fair  Language: Good  Akathisia:  No    AIMS (if indicated): done  Assets:  Communication Skills Desire for Improvement Social Support Transportation  ADL's:  Intact  Cognition: WNL  Sleep:  Good   Metabolic Disorder Labs: Lab Results  Component Value Date   HGBA1C 5.7 (A) 01/08/2023   No results found for: "PROLACTIN" Lab Results  Component Value Date   CHOL 201 (H) 01/08/2023   TRIG 121 01/08/2023   HDL 50 01/08/2023   CHOLHDL 4.0 01/08/2023   VLDL 40.9 07/08/2022   LDLCALC 129 (H) 01/08/2023   LDLCALC 115 (H) 07/08/2022   Lab Results  Component Value Date   TSH 2.77 07/08/2022   TSH 1.821 05/02/2021    Therapeutic Level Labs: No results found for: "LITHIUM" No results found for: "VALPROATE" No results found for: "CBMZ"   Screenings: GAD-7    Flowsheet Row Counselor from 02/16/2023 in [facility] Visit from [facility]/08/2022 [facility] [place] Office Visit from [facility]/13/20[facility]  Total GAD-7 Score 14 13 20       PHQ2-9    Flowsheet Row Counselor from 02/16/2023 in [facility] Visit from 01/08/2023 in [facility] [place] Office Visit from 07/08/2022 in [facility] [place] [facility] [place] Clinical Support from 07/03/2022 in [facility] [place] Office Visit from 04/13/2022 in [facility]  PHQ-2 Total Score 6 6 6  0 6  PHQ-9 Total Score 20 24 21  -- 22      Flowsheet Row Counselor from 02/16/2023 in [facility] Visit from 04/13/2022 in [facility] Admission (Discharged) from 03/25/2022 in [facility]  C-SSRS RISK CATEGORY No Risk Low Risk No Risk       Collaboration of Care: Collaboration of Care: Medication Management AEB medication prescription and Referral or follow-up with counselor/therapist AEB chart review  Patient/Guardian was advised Release of Information must be obtained prior to any record release in order to collaborate their care with an outside provider. Patient/Guardian was advised if they have not already done so to contact the registration department to sign all necessary forms in order for us  to release information regarding their care.   Consent: Patient/Guardian gives verbal consent for treatment and assignment of benefits  for services provided during this visit. Patient/Guardian expressed understanding and agreed to proceed.    Yves Herb, MD 05/04/2023, 4:32 PM

## 2023-05-04 ENCOUNTER — Other Ambulatory Visit: Payer: Self-pay

## 2023-05-04 ENCOUNTER — Encounter (HOSPITAL_COMMUNITY): Payer: Self-pay | Admitting: Psychiatry

## 2023-05-04 ENCOUNTER — Ambulatory Visit (HOSPITAL_BASED_OUTPATIENT_CLINIC_OR_DEPARTMENT_OTHER): Admitting: Psychiatry

## 2023-05-04 VITALS — Ht 68.5 in | Wt 202.0 lb

## 2023-05-04 DIAGNOSIS — Z79899 Other long term (current) drug therapy: Secondary | ICD-10-CM | POA: Diagnosis not present

## 2023-05-04 DIAGNOSIS — F411 Generalized anxiety disorder: Secondary | ICD-10-CM | POA: Diagnosis not present

## 2023-05-04 DIAGNOSIS — F332 Major depressive disorder, recurrent severe without psychotic features: Secondary | ICD-10-CM

## 2023-05-04 DIAGNOSIS — F331 Major depressive disorder, recurrent, moderate: Secondary | ICD-10-CM

## 2023-05-04 MED ORDER — DULOXETINE HCL 60 MG PO CPEP
60.0000 mg | ORAL_CAPSULE | Freq: Two times a day (BID) | ORAL | 0 refills | Status: DC
Start: 2023-05-04 — End: 2023-06-22

## 2023-05-04 MED ORDER — BUPROPION HCL ER (XL) 150 MG PO TB24: 450.0000 mg | ORAL_TABLET | ORAL | 2 refills | Status: DC

## 2023-05-04 MED ORDER — LURASIDONE HCL 40 MG PO TABS: 40.0000 mg | ORAL_TABLET | Freq: Every day | ORAL | 0 refills | Status: DC

## 2023-05-19 NOTE — Telephone Encounter (Unsigned)
 Copied from CRM [id number]. Topic: Clinical - Medication Refill >> May 19, 2023  4:38 PM Magdalene School wrote: Medication: albuterol  (VENTOLIN  HFA) 108 (90 Base) MCG/ACT inhaler  Has the patient contacted their pharmacy? No (Agent: If no, request that the patient contact the pharmacy for the refill. If patient does not wish to contact the pharmacy document the reason why and proceed with request.) (Agent: If yes, when and what did the pharmacy advise?)  This is the patient's preferred pharmacy:  [facility] [address], [place] - [address] [place] [address] Phone: [phone] Fax: [phone]  Is this the correct pharmacy for this prescription? Yes If no, delete pharmacy and type the correct one.   Has the prescription been filled recently? No  Is the patient out of the medication? No  Has the patient been seen for an appointment in the last year OR does the patient have an upcoming appointment? Yes  Can we respond through MyChart? No  Agent: Please be advised that Rx refills may take up to 3 business days. We ask that you follow-up with your pharmacy.

## 2023-05-23 ENCOUNTER — Other Ambulatory Visit: Payer: Self-pay | Admitting: Primary Care

## 2023-05-27 ENCOUNTER — Telehealth: Payer: Self-pay | Admitting: Adult Health

## 2023-05-27 NOTE — Telephone Encounter (Signed)
 Last Fill: Unk, Historical Provider  Last OV: 01/08/23 Next OV: 07/07/23 AWV  Routing to provider for review/authorization.

## 2023-05-27 NOTE — Telephone Encounter (Unsigned)
 Copied from CRM [id number]. Topic: Clinical - Medication Refill >> May 27, 2023  3:52 PM Kita Perish H wrote: Medication: albuterol  (VENTOLIN  HFA) 108 (90 Base) MCG/ACT inhaler  Has the patient contacted their pharmacy? Yes, haven't received a new prescription (Agent: If no, request that the patient contact the pharmacy for the refill. If patient does not wish to contact the pharmacy document the reason why and proceed with request.) (Agent: If yes, when and what did the pharmacy advise?)  This is the patient's preferred pharmacy:  [facility] [address] [place] - [address] [place] [address] Phone: [phone] Fax: [phone]  Is this the correct pharmacy for this prescription? Yes If no, delete pharmacy and type the correct one.   Has the prescription been filled recently? No  Is the patient out of the medication? Yes  Has the patient been seen for an appointment in the last year OR does the patient have an upcoming appointment? Yes  Can we respond through MyChart? No  Agent: Please be advised that Rx refills may take up to 3 business days. We ask that you follow-up with your pharmacy.

## 2023-05-28 MED ORDER — ALBUTEROL SULFATE HFA 108 (90 BASE) MCG/ACT IN AERS: 2.0000 | INHALATION_SPRAY | RESPIRATORY_TRACT | 0 refills | Status: DC | PRN

## 2023-05-28 NOTE — Telephone Encounter (Signed)
 Prescription refilled.

## 2023-05-28 NOTE — Telephone Encounter (Signed)
 Tried to call pt no answer

## 2023-05-28 NOTE — Telephone Encounter (Signed)
 Okay for refill?

## 2023-06-21 NOTE — Progress Notes (Unsigned)
 BH MD/PA/NP OP Progress Note  06/21/2023 9:24 AM JERMIAH [name]  MRN:  [id number]  Visit Diagnosis:  No diagnosis found.   Assessment: Jay [name] is a 73 y.o. male with a history of MDD, GAD, COPD, HTN,  who presented to [facility] for initial evaluation on 04/13/2022.    At initial evaluation patient reported neurovegetative symptoms of depression including low mood, anhedonia, amotivation, poor sleep, increased appetite, excessive tearfulness, fatigue, poor concentration, and intermittent passive SI.  He denied any passive SI at this time reporting that there is only been 2 episodes since 2022.  He denied any intent or plan since around 2014.  Safety planning and crisis resources were discussed. In addition to depression patient also reported significant anxiety.  He endorsed symptoms of constant worry that he is unable to control, difficulty relaxing, restlessness, increased irritability, and fear of something awful happening.  Patient did endorse having episodes of panic where he is short of breath, restless, diaphoretic, and can get muscle rigidity.  These were more frequent while on Wellbutrin  though still occur occasionally since discontinuing.  Patient met criteria for MDD and GAD.  Jay [name] presents for follow-up evaluation. Today, 06/21/23, patient reports    no significant improvement in the interim.  However upon review he has had some minor increases in behavioral activation.  He also has longer-lasting improvement in mood lability, irritability, and tearfulness.  Patient was encouraged to identify his victories and not to minimize them.  Improvement was noted to begin roughly around the time to bupropion  fully entered his system.  Would be appropriate to continue on his current regimen and follow up in a month.  Psychotherapeutic interventions were used during today's session. From 4:05 PM to 4:30 PM we used empathic listening techniques  and provided support. Worked on cognitive re framing techniques and focusing on behavioral activation techniques.  Improvement was evidenced by patient's participation.  Plan: - Continue Cymbalta  60 mg BID for depressed mood/anxiety - Continue Latuda  40 mg w/dinner for depressed mood - Continue Wellbutrin  XL to 450 mg daily - Continue Donepezil  10 mg QD, managed by PCP - CMP, CBC, Vitamin D,  - Continue therapy with Randel Buss - Crisis resources reviewed - Follow up in a month  Chief Complaint:  No chief complaint on file.  HPI: Jay [name] presents reporting that    over the last 2 months he has done very little.  That said he does report that the last 2 weeks he has done more than he has in a long time.  He has helped around the house with the dishes and vacuuming more so than he has in the past.  We encouraged [name] to acknowledge the improvement and not minimize his achievements.  In that being he was upset with himself as he had to sit down to do the dishes we reminded him that the simple act of getting up and doing things as already a step forward.  With the chronic pain symptoms that might need to be adaptations to the way things have been done in the past.  Overall he feels like his mood symptoms are about 70% better than they had been in the past.  Since starting the medication he is no longer experiencing the increased irritability or episodic tearfulness that he previously had.  While the motivation has not improved to a level that he is happy with it is still better than it was previously.  We did review some  behavioral activation techniques and strategies to increase his activity that would be able to accommodate his physical limitations.  One thing [name] is liked is racing and cars and thus we talked about the possibility of getting involved in RC cars.  Past Psychiatric History: Had seen a psychiatrist 2009, no prior psychiatric hospitalizations, no prior suicide attempts.  He denies  ever being connected with a therapist.  Has tried Wellbutrin , Xanax , Lamictal , Restoril in the past  Denies current substance. Alcohol and marijuana 20-30 years ago.  Past Medical History:  Past Medical History:  Diagnosis Date   Anxiety    Arthritis    COPD (chronic obstructive pulmonary disease) (HCC)    Depression    Dysrhythmia    GERD (gastroesophageal reflux disease)    Hypertension    PAF (paroxysmal atrial fibrillation) (HCC)    Shortness of breath    with excertion    Past Surgical History:  Procedure Laterality Date   APPENDECTOMY     COLONOSCOPY WITH PROPOFOL  N/A 03/25/2022   Procedure: COLONOSCOPY WITH PROPOFOL ;  Surgeon: Suzette Espy, MD;  Location: [facility];  Service: Endoscopy;  Laterality: N/A;  9:00 am   EYE SURGERY     POLYPECTOMY  03/25/2022   Procedure: POLYPECTOMY INTESTINAL;  Surgeon: Suzette Espy, MD;  Location: [facility];  Service: Endoscopy;;    Family History:  Family History  Problem Relation Age of Onset   Hypertension Mother    Cancer Father    Hypertension Father    Hypertension Sister    Cancer Brother    Hypertension Brother    Cancer - Colon Neg Hx    Colon polyps Neg Hx     Social History:  Social History   Socioeconomic History   Marital status: Married    Spouse name: Not on file   Number of children: Not on file   Years of education: Not on file   Highest education level: Not on file  Occupational History   Not on file  Tobacco Use   Smoking status: Former    Current packs/day: 0.00    Types: Cigarettes    Quit date: 01/12/1993    Years since quitting: 30.4   Smokeless tobacco: Never  Vaping Use   Vaping status: Never Used  Substance and Sexual Activity   Alcohol use: No    Comment: Quit 1995   Drug use: No   Sexual activity: Not on file  Other Topics Concern   Not on file  Social History Narrative   Not on file   Social Drivers of Health   Financial Resource Strain: Low Risk  (07/03/2022)    Overall Financial Resource Strain (CARDIA)    Difficulty of Paying Living Expenses: Not hard at all  Food Insecurity: No Food Insecurity (07/03/2022)   Hunger Vital Sign    Worried About Running Out of Food in the Last Year: Never true    Ran Out of Food in the Last Year: Never true  Transportation Needs: No Transportation Needs (07/03/2022)   PRAPARE - Administrator, Civil Service (Medical): No    Lack of Transportation (Non-Medical): No  Physical Activity: Inactive (07/03/2022)   Exercise Vital Sign    Days of Exercise per Week: 0 days    Minutes of Exercise per Session: 0 min  Stress: No Stress Concern Present (07/03/2022)   [employer] of Occupational Health - Occupational Stress Questionnaire    Feeling of Stress : Not at all  Social Connections: Socially Integrated (07/03/2022)   Social Connection and Isolation Panel [NHANES]    Frequency of Communication with Friends and Family: More than three times a week    Frequency of Social Gatherings with Friends and Family: More than three times a week    Attends Religious Services: More than 4 times per year    Active Member of [employer] or Organizations: Yes    Attends Engineer, structural: More than 4 times per year    Marital Status: Married    Allergies:  No Active Allergies   Current Medications: Current Outpatient Medications  Medication Sig Dispense Refill   acetaminophen  (TYLENOL ) 500 MG tablet Take 1,000 mg by mouth every 6 (six) hours as needed for moderate pain.     albuterol  (VENTOLIN  HFA) 108 (90 Base) MCG/ACT inhaler Inhale 2 puffs into the lungs every 4 (four) hours as needed for wheezing or shortness of breath. 6.7 g 0   amLODipine  (NORVASC ) 5 MG tablet Take 1 tablet (5 mg total) by mouth every morning. 90 tablet 1   apixaban  (ELIQUIS ) 5 MG TABS tablet Take 1 tablet (5 mg total) by mouth 2 (two) times daily.     atorvastatin  (LIPITOR) 40 MG tablet Take 1 tablet (40 mg total) by mouth daily. 90  tablet 3   buPROPion  (WELLBUTRIN  XL) 150 MG 24 hr tablet Take 3 tablets (450 mg total) by mouth every morning. 90 tablet 2   clotrimazole -betamethasone  (LOTRISONE ) cream Apply 1 Application topically 2 (two) times daily. 60 g 1   diphenhydrAMINE (BENADRYL) 25 MG tablet Take 25 mg by mouth daily.     donepezil  (ARICEPT ) 10 MG tablet TAKE ONE TABLET BY MOUTH EVERYDAY AT BEDTIME 90 tablet 3   DULoxetine  (CYMBALTA ) 60 MG capsule Take 1 capsule (60 mg total) by mouth 2 (two) times daily. 180 capsule 0   esomeprazole  (NEXIUM ) 40 MG capsule Take 1 capsule by mouth in the morning 90 capsule 3   famotidine  (PEPCID ) 20 MG tablet Take 1 tablet by mouth once daily 90 tablet 0   finasteride  (PROSCAR ) 5 MG tablet Take 1 tablet (5 mg total) by mouth daily. 90 tablet 1   Fluticasone-Umeclidin-Vilant (TRELEGY ELLIPTA ) 100-62.5-25 MCG/ACT AEPB INHALE 1 PUFF INTO LUNGS ONCE DAILY 60 each 0   lurasidone  (LATUDA ) 40 MG TABS tablet Take 1 tablet (40 mg total) by mouth at bedtime. 90 tablet 0   memantine  (NAMENDA ) 10 MG tablet Take 1 tablet (10 mg total) by mouth 2 (two) times daily. 180 tablet 3   metoprolol  tartrate (LOPRESSOR ) 50 MG tablet Take 1 tablet (50 mg total) by mouth 2 (two) times daily. 90 tablet 3   Na Sulfate-K Sulfate-Mg Sulf 17.5-3.13-1.6 GM/177ML SOLN As directed 354 mL 0   tamsulosin  (FLOMAX ) 0.4 MG CAPS capsule Take 1 capsule (0.4 mg total) by mouth daily. 90 capsule 3   traMADol  (ULTRAM ) 50 MG tablet Take 1 tablet (50 mg total) by mouth every 6 (six) hours as needed for severe pain. 30 tablet 2   triamcinolone  cream (KENALOG ) 0.1 % Apply 1 Application topically daily as needed (rash). 30 g 2   No current facility-administered medications for this visit.     Musculoskeletal: Strength & Muscle Tone: within normal limits Gait & Station: normal Patient leans: N/A  Psychiatric Specialty Exam: Review of Systems  There were no vitals taken for this visit.There is no height or weight on file to  calculate BMI.  General Appearance: Fairly Groomed  Eye Contact:  Good  Speech:  Clear and Coherent and Normal Rate  Volume:  Normal  Mood:  Depressed  Affect:  Appropriate  Thought Process:  Coherent  Orientation:  Full (Time, Place, and Person)  Thought Content: Logical and Rumination   Suicidal Thoughts:  No  Homicidal Thoughts:  No  Memory:  Immediate;   Good  Judgement:  Fair  Insight:  Fair  Psychomotor Activity:  Normal  Concentration:  Concentration: Fair  Recall:  Poor  Fund of Knowledge: Fair  Language: Good  Akathisia:  No    AIMS (if indicated): done  Assets:  Communication Skills Desire for Improvement Social Support Transportation  ADL's:  Intact  Cognition: WNL  Sleep:  Good   Metabolic Disorder Labs: Lab Results  Component Value Date   HGBA1C 5.7 (A) 01/08/2023   No results found for: "PROLACTIN" Lab Results  Component Value Date   CHOL 201 (H) 01/08/2023   TRIG 121 01/08/2023   HDL 50 01/08/2023   CHOLHDL 4.0 01/08/2023   VLDL 29.5 07/08/2022   LDLCALC 129 (H) 01/08/2023   LDLCALC 115 (H) 07/08/2022   Lab Results  Component Value Date   TSH 2.77 07/08/2022   TSH 1.821 05/02/2021    Therapeutic Level Labs: No results found for: "LITHIUM" No results found for: "VALPROATE" No results found for: "CBMZ"   Screenings: GAD-7    Flowsheet Row Counselor from 02/16/2023 in [facility] Visit from 07/08/2022 in [facility] [place] Office Visit from [facility]/13/2022 in [facility]  Total GAD-7 Score 14 13 20       PHQ2-9    Flowsheet Row Counselor from 02/16/2023 in [facility] at [facility] Visit from 01/08/2023 in [facility] [place] Office Visit from 07/08/2022 in [facility] [place] Clinical Support from 07/03/2022 in [facility]  [place] Office Visit from 04/13/2022 in [facility]  PHQ-2 Total Score 6 6 6  0 6  PHQ-9 Total Score 20 24 21  -- 22      Flowsheet Row Counselor from 02/16/2023 in [facility] Visit from 04[facility]13/2022 in [facility] Admission (Discharged) from 03/25/2022 in [facility]  C-SSRS RISK CATEGORY No Risk Low Risk No Risk       Collaboration of Care: Collaboration of Care: Medication Management AEB medication prescription and Referral or follow-up with counselor/therapist AEB chart review  Patient/Guardian was advised Release of Information must be obtained prior to any record release in order to collaborate their care with an outside provider. Patient/Guardian was advised if they have not already done so to contact the registration department to sign all necessary forms in order for us  to release information regarding their care.   Consent: Patient/Guardian gives verbal consent for treatment and assignment of benefits for services provided during this visit. Patient/Guardian expressed understanding and agreed to proceed.    Yves Herb, MD 06/21/2023, 9:24 AM

## 2023-06-22 ENCOUNTER — Ambulatory Visit (HOSPITAL_COMMUNITY): Admitting: Psychiatry

## 2023-06-22 DIAGNOSIS — F411 Generalized anxiety disorder: Secondary | ICD-10-CM | POA: Diagnosis not present

## 2023-06-22 DIAGNOSIS — F331 Major depressive disorder, recurrent, moderate: Secondary | ICD-10-CM | POA: Diagnosis not present

## 2023-06-22 DIAGNOSIS — Z79899 Other long term (current) drug therapy: Secondary | ICD-10-CM

## 2023-06-22 MED ORDER — DULOXETINE HCL 60 MG PO CPEP: 60.0000 mg | ORAL_CAPSULE | Freq: Two times a day (BID) | ORAL | 0 refills | Status: DC

## 2023-06-22 MED ORDER — BUPROPION HCL ER (XL) 150 MG PO TB24: 450.0000 mg | ORAL_TABLET | ORAL | 2 refills | Status: DC

## 2023-06-22 MED ORDER — LURASIDONE HCL 40 MG PO TABS: 40.0000 mg | ORAL_TABLET | Freq: Every day | ORAL | 0 refills | Status: DC

## 2023-06-23 ENCOUNTER — Ambulatory Visit (HOSPITAL_COMMUNITY): Admitting: Licensed Clinical Social Worker

## 2023-06-23 ENCOUNTER — Encounter (HOSPITAL_COMMUNITY): Payer: Self-pay | Admitting: Psychiatry

## 2023-07-07 ENCOUNTER — Telehealth: Payer: Self-pay

## 2023-07-07 ENCOUNTER — Other Ambulatory Visit: Payer: Self-pay | Admitting: Adult Health

## 2023-07-07 DIAGNOSIS — I10 Essential (primary) hypertension: Secondary | ICD-10-CM

## 2023-07-07 DIAGNOSIS — N401 Enlarged prostate with lower urinary tract symptoms: Secondary | ICD-10-CM

## 2023-07-07 NOTE — Telephone Encounter (Signed)
 Unsuccessful attempts to reach patient on preferred number listed in notes for scheduled AWV Unable to leave message.

## 2023-07-11 NOTE — Progress Notes (Deleted)
 [facility]  [name] DOB: 04/03/1950  REFERRING DOCTOR OR PCP:  *** SOURCE: ***  _________________________________   HISTORICAL  CHIEF COMPLAINT:  No chief complaint on file.   HISTORY OF PRESENT ILLNESS:  ***   Imaging (independent review): MRI of the lumbar spine 01/06/2021 showed mild spinal stenosis in the transverse diameter at L4-L5 due to facet hypertrophy and ligamenta flava hypertrophy and disc protrusion.  There is mild foraminal narrowing but moderate left greater than right lateral recess stenosis that could affect the left L5 nerve root.    CT scan of the cervical spine 03/03/2021 shows multilevel degenerative changes.  The worst 2 levels of C4-C5 and C5-C6 at C4-C5 there is at least moderate spinal stenosis due to calcified disc fragment, facet hypertrophy and uncovertebral spurring and severe right greater than left foraminal narrowing.  At C5-C6 there is mild spinal stenosis and bilateral foraminal narrowing  CT head 03/04/2021 shows a nondisplaced right occipital bone fracture, left frontal contusion, mild generalized cortical atrophy and mild chronic microvascular ischemic changes.  There is calcification of the cavernous internal carotid arteries bilaterally  Carotid Doppler 06/04/2022 shows moderate heterogenous calcified plaque at the left carotid bifurcation and occlusion of the right vertebral artery.  REVIEW OF SYSTEMS: Constitutional: No fevers, chills, sweats, or change in appetite Eyes: No visual changes, double vision, eye pain Ear, nose and throat: No hearing loss, ear pain, nasal congestion, sore throat Cardiovascular: No chest pain, palpitations Respiratory:  No shortness of breath at rest or with exertion.   No wheezes GastrointestinaI: No nausea, vomiting, diarrhea, abdominal pain, fecal incontinence Genitourinary:  No dysuria, urinary retention or frequency.  No nocturia. Musculoskeletal:  No neck pain, back  pain Integumentary: No rash, pruritus, skin lesions Neurological: as above Psychiatric: No depression at this time.  No anxiety Endocrine: No palpitations, diaphoresis, change in appetite, change in weigh or increased thirst Hematologic/Lymphatic:  No anemia, purpura, petechiae. Allergic/Immunologic: No itchy/runny eyes, nasal congestion, recent allergic reactions, rashes  ALLERGIES: No Active Allergies  HOME MEDICATIONS:  Current Outpatient Medications:    acetaminophen  (TYLENOL ) 500 MG tablet, Take 1,000 mg by mouth every 6 (six) hours as needed for moderate pain., Disp: , Rfl:    albuterol  (VENTOLIN  HFA) 108 (90 Base) MCG/ACT inhaler, Inhale 2 puffs into the lungs every 4 (four) hours as needed for wheezing or shortness of breath., Disp: 6.7 g, Rfl: 0   amLODipine  (NORVASC ) 5 MG tablet, TAKE 1 TABLET BY MOUTH IN THE MORNING, Disp: 90 tablet, Rfl: 0   apixaban  (ELIQUIS ) 5 MG TABS tablet, Take 1 tablet (5 mg total) by mouth 2 (two) times daily., Disp: , Rfl:    atorvastatin  (LIPITOR) 40 MG tablet, Take 1 tablet (40 mg total) by mouth daily., Disp: 90 tablet, Rfl: 3   buPROPion  (WELLBUTRIN  XL) 150 MG 24 hr tablet, Take 3 tablets (450 mg total) by mouth every morning., Disp: 90 tablet, Rfl: 2   clotrimazole -betamethasone  (LOTRISONE ) cream, Apply 1 Application topically 2 (two) times daily., Disp: 60 g, Rfl: 1   diphenhydrAMINE (BENADRYL) 25 MG tablet, Take 25 mg by mouth daily., Disp: , Rfl:    donepezil  (ARICEPT ) 10 MG tablet, TAKE ONE TABLET BY MOUTH EVERYDAY AT BEDTIME, Disp: 90 tablet, Rfl: 3   DULoxetine  (CYMBALTA ) 60 MG capsule, Take 1 capsule (60 mg total) by mouth 2 (two) times daily., Disp: 180 capsule, Rfl: 0   esomeprazole  (NEXIUM ) 40 MG capsule, Take 1 capsule by mouth in the morning, Disp:  90 capsule, Rfl: 3   famotidine  (PEPCID ) 20 MG tablet, Take 1 tablet by mouth once daily, Disp: 90 tablet, Rfl: 0   finasteride  (PROSCAR ) 5 MG tablet, Take 1 tablet by mouth once daily, Disp:  90 tablet, Rfl: 0   Fluticasone-Umeclidin-Vilant (TRELEGY ELLIPTA ) 100-62.5-25 MCG/ACT AEPB, INHALE 1 PUFF INTO LUNGS ONCE DAILY, Disp: 60 each, Rfl: 0   lurasidone  (LATUDA ) 40 MG TABS tablet, Take 1 tablet (40 mg total) by mouth at bedtime., Disp: 90 tablet, Rfl: 0   memantine  (NAMENDA ) 10 MG tablet, Take 1 tablet (10 mg total) by mouth 2 (two) times daily., Disp: 180 tablet, Rfl: 3   metoprolol  tartrate (LOPRESSOR ) 50 MG tablet, Take 1 tablet (50 mg total) by mouth 2 (two) times daily., Disp: 90 tablet, Rfl: 3   Na Sulfate-K Sulfate-Mg Sulf 17.5-3.13-1.6 GM/177ML SOLN, As directed, Disp: 354 mL, Rfl: 0   tamsulosin  (FLOMAX ) 0.4 MG CAPS capsule, Take 1 capsule (0.4 mg total) by mouth daily., Disp: 90 capsule, Rfl: 3   traMADol  (ULTRAM ) 50 MG tablet, Take 1 tablet (50 mg total) by mouth every 6 (six) hours as needed for severe pain., Disp: 30 tablet, Rfl: 2   triamcinolone  cream (KENALOG ) 0.1 %, Apply 1 Application topically daily as needed (rash)., Disp: 30 g, Rfl: 2  PAST MEDICAL HISTORY: Past Medical History:  Diagnosis Date   Anxiety    Arthritis    COPD (chronic obstructive pulmonary disease) (HCC)    Depression    Dysrhythmia    GERD (gastroesophageal reflux disease)    Hypertension    PAF (paroxysmal atrial fibrillation) (HCC)    Shortness of breath    with excertion    PAST SURGICAL HISTORY: Past Surgical History:  Procedure Laterality Date   APPENDECTOMY     COLONOSCOPY WITH PROPOFOL  N/A 03/25/2022   Procedure: COLONOSCOPY WITH PROPOFOL ;  Surgeon: Shaaron Lamar HERO, MD;  Location: [facility];  Service: Endoscopy;  Laterality: N/A;  9:00 am   EYE SURGERY     POLYPECTOMY  03/25/2022   Procedure: POLYPECTOMY INTESTINAL;  Surgeon: Shaaron Lamar HERO, MD;  Location: [facility];  Service: Endoscopy;;    FAMILY HISTORY: Family History  Problem Relation Age of Onset   Hypertension Mother    Cancer Father    Hypertension Father    Hypertension Sister    Cancer Brother     Hypertension Brother    Cancer - Colon Neg Hx    Colon polyps Neg Hx     SOCIAL HISTORY: Social History   Socioeconomic History   Marital status: Married    Spouse name: Not on file   Number of children: Not on file   Years of education: Not on file   Highest education level: Not on file  Occupational History   Not on file  Tobacco Use   Smoking status: Former    Current packs/day: 0.00    Types: Cigarettes    Quit date: 01/12/1993    Years since quitting: 30.5   Smokeless tobacco: Never  Vaping Use   Vaping status: Never Used  Substance and Sexual Activity   Alcohol use: No    Comment: Quit 1995   Drug use: No   Sexual activity: Not on file  Other Topics Concern   Not on file  Social History Narrative   Not on file   Social Drivers of Health   Financial Resource Strain: Low Risk  (07/03/2022)   Overall Financial Resource Strain (CARDIA)    Difficulty  of Paying Living Expenses: Not hard at all  Food Insecurity: No Food Insecurity (07/03/2022)   Hunger Vital Sign    Worried About Running Out of Food in the Last Year: Never true    Ran Out of Food in the Last Year: Never true  Transportation Needs: No Transportation Needs (07/03/2022)   PRAPARE - Administrator, Civil Service (Medical): No    Lack of Transportation (Non-Medical): No  Physical Activity: Inactive (07/03/2022)   Exercise Vital Sign    Days of Exercise per Week: 0 days    Minutes of Exercise per Session: 0 min  Stress: No Stress Concern Present (07/03/2022)   [employer] of Occupational Health - Occupational Stress Questionnaire    Feeling of Stress : Not at all  Social Connections: Socially Integrated (07/03/2022)   Social Connection and Isolation Panel    Frequency of Communication with Friends and Family: More than three times a week    Frequency of Social Gatherings with Friends and Family: More than three times a week    Attends Religious Services: More than 4 times per year     Active Member of [employer] or Organizations: Yes    Attends Banker Meetings: More than 4 times per year    Marital Status: Married  Catering manager Violence: Not At Risk (07/03/2022)   Humiliation, Afraid, Rape, and Kick questionnaire    Fear of Current or Ex-Partner: No    Emotionally Abused: No    Physically Abused: No    Sexually Abused: No       PHYSICAL EXAM  There were no vitals filed for this visit.  There is no height or weight on file to calculate BMI.   General: The patient is well-developed and well-nourished and in no acute distress  HEENT:  Head is [place]/AT.  Sclera are anicteric.  Funduscopic exam shows normal optic discs and retinal vessels.  Neck: No carotid bruits are noted.  The neck is nontender.  Cardiovascular: The heart has a regular rate and rhythm with a normal S1 and S2. There were no murmurs, gallops or rubs.    Skin: Extremities are without rash or  edema.  Musculoskeletal:  Back is nontender  Neurologic Exam  Mental status: The patient is alert and oriented x 3 at the time of the examination. The patient has apparent normal recent and remote memory, with an apparently normal attention span and concentration ability.   Speech is normal.  Cranial nerves: Extraocular movements are full. Pupils are equal, round, and reactive to light and accomodation.  Visual fields are full.  Facial symmetry is present. There is good facial sensation to soft touch bilaterally.Facial strength is normal.  Trapezius and sternocleidomastoid strength is normal. No dysarthria is noted.  The tongue is midline, and the patient has symmetric elevation of the soft palate. No obvious hearing deficits are noted.  Motor:  Muscle bulk is normal.   Tone is normal. Strength is  5 / 5 in all 4 extremities.   Sensory: Sensory testing is intact to pinprick, soft touch and vibration sensation in all 4 extremities.  Coordination: Cerebellar testing reveals good finger-nose-finger  and heel-to-shin bilaterally.  Gait and station: Station is normal.   Gait is normal. Tandem gait is normal. Romberg is negative.   Reflexes: Deep tendon reflexes are symmetric and normal bilaterally.   Plantar responses are flexor.    DIAGNOSTIC DATA (LABS, IMAGING, TESTING) - I reviewed patient records, labs, notes, testing and imaging  myself where available.  Lab Results  Component Value Date   WBC 8.7 07/08/2022   HGB 15.0 07/08/2022   HCT 46.5 07/08/2022   MCV 90.9 07/08/2022   PLT 255.0 07/08/2022      Component Value Date/Time   NA 140 07/08/2022 1403   K 4.5 07/08/2022 1403   CL 102 07/08/2022 1403   CO2 30 07/08/2022 1403   GLUCOSE 89 07/08/2022 1403   BUN 18 07/08/2022 1403   CREATININE 1.32 07/08/2022 1403   CALCIUM  9.4 07/08/2022 1403   PROT 6.9 01/08/2023 1528   ALBUMIN 4.2 01/08/2023 1528   AST 13 01/08/2023 1528   ALT 15 01/08/2023 1528   ALKPHOS 93 01/08/2023 1528   BILITOT 0.5 01/08/2023 1528   GFRNONAA >60 03/23/2022 1005   Lab Results  Component Value Date   CHOL 201 (H) 01/08/2023   HDL 50 01/08/2023   LDLCALC 129 (H) 01/08/2023   TRIG 121 01/08/2023   CHOLHDL 4.0 01/08/2023   Lab Results  Component Value Date   HGBA1C 5.7 (A) 01/08/2023   No results found for: VITAMINB12 Lab Results  Component Value Date   TSH 2.77 07/08/2022       ASSESSMENT AND PLAN  ***   Richards Pherigo A. Vear, MD, [facility] 07/11/2023, 4:10 PM Certified in Neurology, Clinical Neurophysiology, Sleep Medicine and Neuroimaging  [facility] [address] [place], [place] [address] [phone]

## 2023-07-13 ENCOUNTER — Encounter: Payer: Self-pay | Admitting: Neurology

## 2023-07-13 ENCOUNTER — Ambulatory Visit: Admitting: Neurology

## 2023-07-19 ENCOUNTER — Ambulatory Visit: Admitting: Neurology

## 2023-07-27 ENCOUNTER — Other Ambulatory Visit: Payer: Self-pay | Admitting: Adult Health

## 2023-07-27 DIAGNOSIS — I4811 Longstanding persistent atrial fibrillation: Secondary | ICD-10-CM

## 2023-07-27 DIAGNOSIS — I10 Essential (primary) hypertension: Secondary | ICD-10-CM

## 2023-07-29 ENCOUNTER — Ambulatory Visit: Admitting: Neurology

## 2023-08-23 NOTE — Progress Notes (Signed)
 BH MD/PA/NP OP Progress Note  08/24/2023 4:51 PM Jay [name]  MRN:  [id number]  Visit Diagnosis:    ICD-10-CM   1. GAD (generalized anxiety disorder)  F41.1 DULoxetine  (CYMBALTA ) 60 MG capsule    lurasidone  (LATUDA ) 40 MG TABS tablet    2. Moderate episode of recurrent major depressive disorder (HCC)  F33.1 DULoxetine  (CYMBALTA ) 60 MG capsule    buPROPion  (WELLBUTRIN  XL) 150 MG 24 hr tablet    lurasidone  (LATUDA ) 40 MG TABS tablet    3. Encounter for long-term (current) use of medications  Z79.899 buPROPion  (WELLBUTRIN  XL) 150 MG 24 hr tablet      Assessment: Jay [name] is a 73 y.o. male with a history of MDD, GAD, COPD, HTN,  who presented to [facility] for initial evaluation on 04/13/2022.    At initial evaluation patient reported neurovegetative symptoms of depression including low mood, anhedonia, amotivation, poor sleep, increased appetite, excessive tearfulness, fatigue, poor concentration, and intermittent passive SI.  He denied any passive SI at this time reporting that there is only been 2 episodes since 2022.  He denied any intent or plan since around 2014.  Safety planning and crisis resources were discussed. In addition to depression patient also reported significant anxiety.  He endorsed symptoms of constant worry that he is unable to control, difficulty relaxing, restlessness, increased irritability, and fear of something awful happening.  Patient did endorse having episodes of panic where he is short of breath, restless, diaphoretic, and can get muscle rigidity.  These were more frequent while on Wellbutrin  though still occur occasionally since discontinuing.  Patient met criteria for MDD and GAD.  Jay [name] presents for follow-up evaluation. Today, 08/24/23, patient reports mood is euthymic with only concern being the ongoing fatigue symptoms.  There are number contributing factors to this including his COPD, chronic pain and back  injury, deconditioning, depression, and potentially medications.  We can trial transition of Cymbalta  dosing to nighttime to see if this helps any with daytime fatigue.  Furthermore we did spend a significant portion of the session discussing behavioral activation and strategies to help implement this further.  We will continue on the remainder of current regimen and follow up in 2 months.  Psychotherapeutic interventions were used during today's session. From 4:28 PM to 4:45 PM Therapeutic interventions included empathic listening, supportive therapy, cognitive and behavioral therapy, motivational interviewing. Worked on cognitive reframing techniques and unhelpful thoughts challenged as appropriate. Alternative thoughts developed with guidance. Reviewed some techniques to facilitate increased behavioral activation with specific strategies explored. Improvement was evidenced by patient's participation and identified commitment to therapy goals.   Plan: - Change Cymbalta  to 120 mg QPM for depressed mood/anxiety - Continue Latuda  40 mg w/dinner for depressed mood - Continue Wellbutrin  XL 450 mg daily - Continue Donepezil  10 mg QD, managed by PCP - CMP, CBC, Vitamin D,  - Discontinued therapy with Darleene - Crisis resources reviewed - Follow up in 2 months  Chief Complaint:  Chief Complaint  Patient presents with   Follow-up   HPI: Jay [name] presents reporting that he has been doing pretty good over the past 2 months. Nothing is bothering, and he feels like his mood has been a bit better.  His only concern is the ongoing difficulty with fatigue and lacking energy to do things.  That said on review he still is using the walker roughly the same amount as he had a few months ago.  He has not  been getting out to soccer games for his grandkids due to the season being over but he is going to visit family 1-2 times a week.  He also has an upcoming trip to the beach and a couple weeks which he and the family do  every year and Jay [name] is looking forward to.  We discussed behavioral activation today and reviewed some strategies to help improve patient's fatigue.  Explained how continued activity can actually build up his stamina over time allowing him to do more.  [facility] [employer] though [facility] has never [facility].  We suggested looking into some of the group classes such as pool aerobics or chair yoga.  Medication wise patient is taking them consistently.  He does question whether anything could be contributing to fatigue.  While unlikely we can trial transitioning Cymbalta  to only nighttime dosing to see if that improves the daytime fatigue.  Past Psychiatric History: Had seen a psychiatrist 2009, no prior psychiatric hospitalizations, no prior suicide attempts.  He denies ever being connected with a therapist.  Has tried Wellbutrin , Xanax , Lamictal , Restoril in the past  Denies current substance. Alcohol and marijuana 20-30 years ago.  Past Medical History:  Past Medical History:  Diagnosis Date   Anxiety    Arthritis    COPD (chronic obstructive pulmonary disease) (HCC)    Depression    Dysrhythmia    GERD (gastroesophageal reflux disease)    Hypertension    PAF (paroxysmal atrial fibrillation) (HCC)    Shortness of breath    with excertion    Past Surgical History:  Procedure Laterality Date   APPENDECTOMY     COLONOSCOPY WITH PROPOFOL  N/A 03/25/2022   Procedure: COLONOSCOPY WITH PROPOFOL ;  Surgeon: Shaaron Lamar HERO, MD;  [facility]: [facility];  Service: Endoscopy;  Laterality: N/A;  9:00 am   EYE SURGERY     POLYPECTOMY  03/25/2022   Procedure: POLYPECTOMY INTESTINAL;  Surgeon: Shaaron Lamar HERO, MD;  Location: [facility];  Service: Endoscopy;;    Family History:  Family History  Problem Relation Age of Onset   Hypertension Mother    Cancer Father    Hypertension Father    Hypertension Sister    Cancer Brother    Hypertension Brother    Cancer - Colon Neg Hx    Colon  polyps Neg Hx     Social History:  Social History   Socioeconomic History   Marital status: Married    Spouse name: Not on file   Number of children: Not on file   Years of education: Not on file   Highest education level: Not on file  Occupational History   Not on file  Tobacco Use   Smoking status: Former    Current packs/day: 0.00    Types: Cigarettes    Quit date: 01/12/1993    Years since quitting: 30.6   Smokeless tobacco: Never  Vaping Use   Vaping status: Never Used  Substance and Sexual Activity   Alcohol use: No    Comment: Quit 1995   Drug use: No   Sexual activity: Not on file  Other Topics Concern   Not on file  Social History Narrative   Not on file   Social Drivers of Health   Financial Resource Strain: Low Risk  (07/03/2022)   Overall Financial Resource Strain (CARDIA)    Difficulty of Paying Living Expenses: Not hard at all  Food Insecurity: No Food Insecurity (07/03/2022)   Hunger Vital  Sign    Worried About Programme researcher, broadcasting/film/video in the Last Year: Never true    Ran Out of Food in the Last Year: Never true  Transportation Needs: No Transportation Needs (07/03/2022)   PRAPARE - Administrator, Civil Service (Medical): No    Lack of Transportation (Non-Medical): No  Physical Activity: Inactive (07/03/2022)   Exercise Vital Sign    Days of Exercise per Week: 0 days    Minutes of Exercise per Session: 0 min  Stress: No Stress Concern Present (07/03/2022)   [employer] of Occupational Health - Occupational Stress Questionnaire    Feeling of Stress : Not at all  Social Connections: Socially Integrated (07/03/2022)   Social Connection and Isolation Panel    Frequency of Communication with Friends and Family: More than three times a week    Frequency of Social Gatherings with Friends and Family: More than three times a week    Attends Religious Services: More than 4 times per year    Active Member of [employer] or Organizations: Yes    Attends  Engineer, structural: More than 4 times per year    Marital Status: Married    Allergies:  No Active Allergies   Current Medications: Current Outpatient Medications  Medication Sig Dispense Refill   acetaminophen (TYLENOL) 500 MG tablet Take 1,000 mg by mouth every 6 (six) hours as needed for moderate pain.     albuterol (VENTOLIN HFA) 108 (90 Base) MCG/ACT inhaler Inhale 2 puffs into the lungs every 4 (four) hours as needed for wheezing or shortness of breath. 6.7 g 0   amLODipine (NORVASC) 5 MG tablet TAKE 1 TABLET BY MOUTH IN THE MORNING 90 tablet 0   apixaban (ELIQUIS) 5 MG TABS tablet Take 1 tablet (5 mg total) by mouth 2 (two) times daily.     atorvastatin (LIPITOR) 40 MG tablet Take 1 tablet by mouth once daily 30 tablet 0   buPROPion (WELLBUTRIN XL) 150 MG 24 hr tablet Take 3 tablets (450 mg total) by mouth every morning. 90 tablet 2   clotrimazole-betamethasone (LOTRISONE) cream Apply 1 Application topically 2 (two) times daily. 60 g 1   diphenhydrAMINE (BENADRYL) 25 MG tablet Take 25 mg by mouth daily.     donepezil (ARICEPT) 10 MG tablet TAKE ONE TABLET BY MOUTH EVERYDAY AT BEDTIME 90 tablet 3   DULoxetine (CYMBALTA) 60 MG capsule Take 2 capsules (120 mg total) by mouth every evening. 180 capsule 0   esomeprazole (NEXIUM) 40 MG capsule Take 1 capsule by mouth in the morning 90 capsule 3   famotidine (PEPCID) 20 MG tablet Take 1 tablet by mouth once daily 90 tablet 0   finasteride (PROSCAR) 5 MG tablet Take 1 tablet by mouth once daily 90 tablet 0   Fluticasone-Umeclidin-Vilant (TRELEGY ELLIPTA) 100-62.5-25 MCG/ACT AEPB INHALE 1 PUFF INTO LUNGS ONCE DAILY 60 each 0   lurasidone (LATUDA) 40 MG TABS tablet Take 1 tablet (40 mg total) by mouth at bedtime. 90 tablet 0   memantine (NAMENDA) 10 MG tablet Take 1 tablet (10 mg total) by mouth 2 (two) times daily. 180 tablet 3   metoprolol tartrate (LOPRESSOR) 50 MG tablet Take 1 tablet by mouth twice daily 90 tablet 0   Na  Sulfate-K Sulfate-Mg Sulf 17.5-3.13-1.6 GM/177ML SOLN As directed 354 mL 0   tamsulosin (FLOMAX) 0.4 MG CAPS capsule Take 1 capsule (0.4 mg total) by mouth daily. 90 capsule 3   traMADol (ULTRAM) 50 MG tablet  Take 1 tablet (50 mg total) by mouth every 6 (six) hours as needed for severe pain. 30 tablet 2   triamcinolone cream (KENALOG) 0.1 % Apply 1 Application topically daily as needed (rash). 30 g 2   No current facility-administered medications for this visit.     Musculoskeletal: Strength & Muscle Tone: within normal limits Gait & Station: normal Patient leans: N/A  Psychiatric Specialty Exam: Review of Systems  There were no vitals taken for this visit.There is no height or weight on file to calculate BMI.  General Appearance: Fairly Groomed  Eye Contact:  Good  Speech:  Clear and Coherent and Normal Rate  Volume:  Normal  Mood:  Depressed  Affect:  Appropriate  Thought Process:  Coherent  Orientation:  Full (Time, Place, and Person)  Thought Content: Logical and Rumination   Suicidal Thoughts:  No  Homicidal Thoughts:  No  Memory:  Immediate;   Good  Judgement:  Fair  Insight:  Fair  Psychomotor Activity:  Normal  Concentration:  Concentration: Fair  Recall:  Poor  Fund of Knowledge: Fair  Language: Good  Akathisia:  No    AIMS (if indicated): done  Assets:  Communication Skills Desire for Improvement Social Support Transportation  ADL's:  Intact  Cognition: WNL  Sleep:  Good   Metabolic Disorder Labs: Lab Results  Component Value Date   HGBA1C 5.7 (A) 01/08/2023   No results found for: PROLACTIN Lab Results  Component Value Date   CHOL 201 (H) 01/08/2023   TRIG 121 01/08/2023   HDL 50 01/08/2023   CHOLHDL 4.0 01/08/2023   VLDL 85.1 07/08/2022   LDLCALC 129 (H) 01/08/2023   LDLCALC 115 (H) 07/08/2022   Lab Results  Component Value Date   TSH 2.77 07/08/2022   TSH 1.821 05/02/2021    Therapeutic Level Labs: No results found for:  LITHIUM No results found for: VALPROATE No results found for: CBMZ   Screenings: GAD-7    Flowsheet Row Counselor from 02/16/2023 in [facility] Visit from 07/08/2022 in [facility] [place] Office Visit from [facility]/13/2022 [facility]  Total GAD-7 Score 14 13 20    [employer]    Flowsheet Row Counselor from 02/16/2023 in [facility] Visit from 01/08/2023 in [facility] [place] [facility] [place] Office Visit from 07[facility]08/2022 in [facility] [place] [facility] [place] Clinical Support from 07[facility]03/2022 in [facility] [place] [facility] [place] Office Visit from 04/13/2022 in [facility]  PHQ-2 Total Score 6 6 6  0 6  PHQ-9 Total Score 20 24 21  -- 22   Flowsheet Row Counselor from 02/16/2023 in [facility] at [facility] Visit from 04/13/2022 in [facility] Admission (Discharged) from 03/25/2022 in [facility]  C-SSRS RISK CATEGORY No Risk Low Risk No Risk    Collaboration of Care: Collaboration of Care: Medication Management AEB medication prescription and Referral or follow-up with counselor/therapist AEB chart review  Patient/Guardian was advised Release of Information must be obtained prior to any record release in order to collaborate their care with an outside provider. Patient/Guardian was advised if they have not already done so to contact the registration department to sign all necessary forms in order for us  to release information regarding their care.   Consent: Patient/Guardian gives verbal consent for treatment and assignment of benefits for services provided during this visit. Patient/Guardian  expressed understanding and agreed to proceed.    Virtual Visit via Video Note  I connected with  Jay [name] on 08/24/23 at  4:30 PM EDT by a video enabled telemedicine application and verified that I am speaking with the correct person using two identifiers.  Location: Patient: Home Provider: Home Office   I discussed the limitations of evaluation and management by telemedicine and the availability of in person appointments. The patient expressed understanding and agreed to proceed.   I discussed the assessment and treatment plan with the patient. The patient was provided an opportunity to ask questions and all were answered. The patient agreed with the plan and demonstrated an understanding of the instructions.   The patient was advised to call back or seek an in-person evaluation if the symptoms worsen or if the condition fails to improve as anticipated.  I provided 20 minutes of non-face-to-face time during this encounter.   Arvella CHRISTELLA Finder, MD   Arvella CHRISTELLA Finder, MD 08/24/2023, 4:51 PM

## 2023-08-24 ENCOUNTER — Encounter (HOSPITAL_COMMUNITY): Payer: Self-pay | Admitting: Psychiatry

## 2023-08-24 ENCOUNTER — Telehealth (HOSPITAL_COMMUNITY): Admitting: Psychiatry

## 2023-08-24 DIAGNOSIS — F411 Generalized anxiety disorder: Secondary | ICD-10-CM | POA: Diagnosis not present

## 2023-08-24 DIAGNOSIS — F331 Major depressive disorder, recurrent, moderate: Secondary | ICD-10-CM | POA: Diagnosis not present

## 2023-08-24 DIAGNOSIS — Z79899 Other long term (current) drug therapy: Secondary | ICD-10-CM | POA: Diagnosis not present

## 2023-08-24 MED ORDER — LURASIDONE HCL 40 MG PO TABS: 40.0000 mg | ORAL_TABLET | Freq: Every day | ORAL | 0 refills | Status: DC

## 2023-08-24 MED ORDER — BUPROPION HCL ER (XL) 150 MG PO TB24: 450.0000 mg | ORAL_TABLET | ORAL | 2 refills | Status: DC

## 2023-08-24 MED ORDER — DULOXETINE HCL 60 MG PO CPEP: 120.0000 mg | ORAL_CAPSULE | Freq: Every evening | ORAL | 0 refills | Status: DC

## 2023-08-30 ENCOUNTER — Other Ambulatory Visit: Payer: Self-pay | Admitting: Adult Health

## 2023-09-20 ENCOUNTER — Other Ambulatory Visit: Payer: Self-pay | Admitting: Adult Health

## 2023-09-20 DIAGNOSIS — I10 Essential (primary) hypertension: Secondary | ICD-10-CM

## 2023-09-20 DIAGNOSIS — I4811 Longstanding persistent atrial fibrillation: Secondary | ICD-10-CM

## 2023-10-03 ENCOUNTER — Other Ambulatory Visit: Payer: Self-pay | Admitting: Adult Health

## 2023-10-03 DIAGNOSIS — R4189 Other symptoms and signs involving cognitive functions and awareness: Secondary | ICD-10-CM

## 2023-10-05 NOTE — Telephone Encounter (Signed)
Pt needs a f/u for  further refills

## 2023-10-12 ENCOUNTER — Other Ambulatory Visit: Payer: Self-pay | Admitting: Adult Health

## 2023-10-12 DIAGNOSIS — I10 Essential (primary) hypertension: Secondary | ICD-10-CM

## 2023-10-12 DIAGNOSIS — N401 Enlarged prostate with lower urinary tract symptoms: Secondary | ICD-10-CM

## 2023-10-15 ENCOUNTER — Other Ambulatory Visit: Payer: Self-pay | Admitting: Adult Health

## 2023-10-15 DIAGNOSIS — B356 Tinea cruris: Secondary | ICD-10-CM

## 2023-11-01 NOTE — Progress Notes (Deleted)
 BH MD/PA/NP OP Progress Note  11/01/2023 9:18 AM [name]  MRN:  [id number]  Visit Diagnosis:  No diagnosis found.  Assessment: Jay [name] is a 73 y.o. male with a history of MDD, GAD, COPD, HTN,  who presented to [facility] for initial evaluation on 04/13/2022.    At initial evaluation patient reported neurovegetative symptoms of depression including low mood, anhedonia, amotivation, poor sleep, increased appetite, excessive tearfulness, fatigue, poor concentration, and intermittent passive SI.  He denied any passive SI at this time reporting that there is only been 2 episodes since 2022.  He denied any intent or plan since around 2014.  Safety planning and crisis resources were discussed. In addition to depression patient also reported significant anxiety.  He endorsed symptoms of constant worry that he is unable to control, difficulty relaxing, restlessness, increased irritability, and fear of something awful happening.  Patient did endorse having episodes of panic where he is short of breath, restless, diaphoretic, and can get muscle rigidity.  These were more frequent while on Wellbutrin  though still occur occasionally since discontinuing.  Patient met criteria for MDD and GAD.  Jay [name] presents for follow-up evaluation. Today, 11/01/23, patient reports mood    is euthymic with only concern being the ongoing fatigue symptoms.  There are number contributing factors to this including his COPD, chronic pain and back injury, deconditioning, depression, and potentially medications.  We can trial transition of Cymbalta  dosing to nighttime to see if this helps any with daytime fatigue.  Furthermore we did spend a significant portion of the session discussing behavioral activation and strategies to help implement this further.  We will continue on the remainder of current regimen and follow up in 2 months.  Psychotherapeutic interventions were used  during today's session. From 4:28 PM to 4:45 PM Therapeutic interventions included empathic listening, supportive therapy, cognitive and behavioral therapy, motivational interviewing. Worked on cognitive reframing techniques and unhelpful thoughts challenged as appropriate. Alternative thoughts developed with guidance. Reviewed some techniques to facilitate increased behavioral activation with specific strategies explored. Improvement was evidenced by patient's participation and identified commitment to therapy goals.   Plan: - Continue Cymbalta  60 mg BID for depressed mood/anxiety - Continue Latuda  40 mg w/dinner for depressed mood - Continue Wellbutrin  XL 450 mg daily - Continue Donepezil  10 mg QD, managed by PCP - CMP, CBC, Vitamin D,  - Discontinued therapy with Darleene - Crisis resources reviewed - Follow up in 2 months  Chief Complaint:  No chief complaint on file.  HPI: Jay [name] presents reporting that    not a whole lot has changed over the past month.  Mood wise he thinks that he has been feeling better compared to this past visit.  He has noticed that his mood seems to have mellowed out overall and things that have bothered him in the past have not been as big of the concern.  For instance patient has gotten better at accepting that he has increased physical limitations compared to when he was younger.  Empathic listening techniques were used and support was provided around this.  Patient has been a bit more active over the past 6 weeks.  He is using his walker consistently now and this has allowed him to get around a bit more.  Patient notes that he has gone to his granddaughter's soccer games a couple times in the interim.  The fatigue from these episodes is still significant.  We reviewed behavioral activation techniques  and the process of gradually working up to bigger activities.  We also reviewed how his longer period of inactivity makes it so he can get fatigued easier.  As he builds  up his strength the endurance will also begin to improve.  Patient feels like the medications are still beneficial and has taken them consistently.  He denies any adverse side effects.  He notes that he did discontinue therapy as he felt they were getting into topics that were not as productive.  For instance the discussion about his childhood and parents which Jay [name] does not feel as an issue.  Compared to the financial constraints which he lists as his biggest current problem.  In that regard he felt that the cost of the therapy was not leading to significant benefit.   Past Psychiatric History: Had seen a psychiatrist 2009, no prior psychiatric hospitalizations, no prior suicide attempts.  He denies ever being connected with a therapist.  Has tried Wellbutrin , Xanax , Lamictal , Restoril in the past  Denies current substance. Alcohol and marijuana 20-30 years ago.  Past Medical History:  Past Medical History:  Diagnosis Date   Anxiety    Arthritis    COPD (chronic obstructive pulmonary disease) (HCC)    Depression    Dysrhythmia    GERD (gastroesophageal reflux disease)    Hypertension    PAF (paroxysmal atrial fibrillation) (HCC)    Shortness of breath    with excertion    Past Surgical History:  Procedure Laterality Date   APPENDECTOMY     COLONOSCOPY WITH PROPOFOL  N/A 03/25/2022   Procedure: COLONOSCOPY WITH PROPOFOL ;  Surgeon: Shaaron Lamar HERO, MD;  Location: [facility];  Service: Endoscopy;  Laterality: N/A;  9:00 am   EYE SURGERY     POLYPECTOMY  03/25/2022   Procedure: POLYPECTOMY INTESTINAL;  Surgeon: Shaaron Lamar HERO, MD;  Location: [facility];  Service: Endoscopy;;    Family History:  Family History  Problem Relation Age of Onset   Hypertension Mother    Cancer Father    Hypertension Father    Hypertension Sister    Cancer Brother    Hypertension Brother    Cancer - Colon Neg Hx    Colon polyps Neg Hx     Social History:  Social History   Socioeconomic  History   Marital status: Married    Spouse name: Not on file   Number of children: Not on file   Years of education: Not on file   Highest education level: Not on file  Occupational History   Not on file  Tobacco Use   Smoking status: Former    Current packs/day: 0.00    Types: Cigarettes    Quit date: 01/12/1993    Years since quitting: 30.8   Smokeless tobacco: Never  Vaping Use   Vaping status: Never Used  Substance and Sexual Activity   Alcohol use: No    Comment: Quit 1995   Drug use: No   Sexual activity: Not on file  Other Topics Concern   Not on file  Social History Narrative   Not on file   Social Drivers of Health   Financial Resource Strain: Low Risk  (07/03/2022)   Overall Financial Resource Strain (CARDIA)    Difficulty of Paying Living Expenses: Not hard at all  Food Insecurity: No Food Insecurity (07/03/2022)   Hunger Vital Sign    Worried About Running Out of Food in the Last Year: Never true    Ran Out  of Food in the Last Year: Never true  Transportation Needs: No Transportation Needs (07/03/2022)   PRAPARE - Administrator, Civil Service (Medical): No    Lack of Transportation (Non-Medical): No  Physical Activity: Inactive (07/03/2022)   Exercise Vital Sign    Days of Exercise per Week: 0 days    Minutes of Exercise per Session: 0 min  Stress: No Stress Concern Present (07/03/2022)   [employer] of Occupational Health - Occupational Stress Questionnaire    Feeling of Stress : Not at all  Social Connections: Socially Integrated (07/03/2022)   Social Connection and Isolation Panel    Frequency of Communication with Friends and Family: More than three times a week    Frequency of Social Gatherings with Friends and Family: More than three times a week    Attends Religious Services: More than 4 times per year    Active Member of [employer] or Organizations: Yes    Attends Engineer, structural: More than 4 times per year    Marital  Status: Married    Allergies:  No Active Allergies   Current Medications: Current Outpatient Medications  Medication Sig Dispense Refill   acetaminophen  (TYLENOL ) 500 MG tablet Take 1,000 mg by mouth every 6 (six) hours as needed for moderate pain.     albuterol  (VENTOLIN  HFA) 108 (90 Base) MCG/ACT inhaler Inhale 2 puffs into the lungs every 4 (four) hours as needed for wheezing or shortness of breath. 6.7 g 0   amLODipine  (NORVASC ) 5 MG tablet TAKE 1 TABLET BY MOUTH IN THE MORNING 90 tablet 0   apixaban  (ELIQUIS ) 5 MG TABS tablet Take 1 tablet (5 mg total) by mouth 2 (two) times daily.     atorvastatin  (LIPITOR) 40 MG tablet Take 1 tablet by mouth once daily 30 tablet 0   buPROPion  (WELLBUTRIN  XL) 150 MG 24 hr tablet Take 3 tablets (450 mg total) by mouth every morning. 90 tablet 2   clotrimazole -betamethasone  (LOTRISONE ) cream APPLY CREAM TOPICALLY TWICE DAILY 60 g 0   diphenhydrAMINE (BENADRYL) 25 MG tablet Take 25 mg by mouth daily.     donepezil  (ARICEPT ) 10 MG tablet TAKE 1 TABLET BY MOUTH ONCE DAILY AT BEDTIME 30 tablet 0   DULoxetine  (CYMBALTA ) 60 MG capsule Take 2 capsules (120 mg total) by mouth every evening. 180 capsule 0   esomeprazole  (NEXIUM ) 40 MG capsule Take 1 capsule by mouth in the morning 90 capsule 3   famotidine  (PEPCID ) 20 MG tablet Take 1 tablet by mouth once daily 90 tablet 0   finasteride  (PROSCAR ) 5 MG tablet Take 1 tablet by mouth once daily 90 tablet 0   Fluticasone-Umeclidin-Vilant (TRELEGY ELLIPTA ) 100-62.5-25 MCG/ACT AEPB INHALE 1 PUFF INTO LUNGS ONCE DAILY 60 each 0   lurasidone  (LATUDA ) 40 MG TABS tablet Take 1 tablet (40 mg total) by mouth at bedtime. 90 tablet 0   memantine  (NAMENDA ) 10 MG tablet Take 1 tablet (10 mg total) by mouth 2 (two) times daily. 180 tablet 3   metoprolol  tartrate (LOPRESSOR ) 50 MG tablet Take 1 tablet by mouth twice daily 90 tablet 0   Na Sulfate-K Sulfate-Mg Sulf 17.5-3.13-1.6 GM/177ML SOLN As directed 354 mL 0   tamsulosin   (FLOMAX ) 0.4 MG CAPS capsule Take 1 capsule (0.4 mg total) by mouth daily. 90 capsule 3   traMADol  (ULTRAM ) 50 MG tablet Take 1 tablet (50 mg total) by mouth every 6 (six) hours as needed for severe pain. 30 tablet 2   triamcinolone   cream (KENALOG ) 0.1 % Apply 1 Application topically daily as needed (rash). 30 g 2   No current facility-administered medications for this visit.     Musculoskeletal: Strength & Muscle Tone: within normal limits Gait & Station: normal Patient leans: N/A  Psychiatric Specialty Exam: Review of Systems  There were no vitals taken for this visit.There is no height or weight on file to calculate BMI.  General Appearance: Fairly Groomed  Eye Contact:  Good  Speech:  Clear and Coherent and Normal Rate  Volume:  Normal  Mood:  Depressed  Affect:  Appropriate  Thought Process:  Coherent  Orientation:  Full (Time, Place, and Person)  Thought Content: Logical and Rumination   Suicidal Thoughts:  No  Homicidal Thoughts:  No  Memory:  Immediate;   Good  Judgement:  Fair  Insight:  Fair  Psychomotor Activity:  Normal  Concentration:  Concentration: Fair  Recall:  Poor  Fund of Knowledge: Fair  Language: Good  Akathisia:  No    AIMS (if indicated): done  Assets:  Communication Skills Desire for Improvement Social Support Transportation  ADL's:  Intact  Cognition: WNL  Sleep:  Good   Metabolic Disorder Labs: Lab Results  Component Value Date   HGBA1C 5.7 (A) 01/08/2023   No results found for: PROLACTIN Lab Results  Component Value Date   CHOL 201 (H) 01/08/2023   TRIG 121 01/08/2023   HDL 50 01/08/2023   CHOLHDL 4.0 01/08/2023   VLDL 85.1 07/08/2022   LDLCALC 129 (H) 01/08/2023   LDLCALC 115 (H) 07/08/2022   Lab Results  Component Value Date   TSH 2.77 07/08/2022   TSH 1.821 05/02/2021    Therapeutic Level Labs: No results found for: LITHIUM No results found for: VALPROATE No results found for: CBMZ   Screenings: GAD-7     Flowsheet Row Counselor from 02/16/2023 in [facility] Visit from 07/08/2022 in [facility] [place] [facility] Visit from [facility]/13/2022 [facility]  Total GAD-7 Score 14 13 20    PHQ2-9    Flowsheet Row Counselor from 02/16/2023 in [facility] Visit from 01/08/2023 in [facility] [place] [facility] Visit from 07[facility]08/2022 in [facility] [place] Clinical Support from 07/03/2022 in [facility] [place] [facility] Visit from 04/13/2022 in [facility]  PHQ-2 Total Score 6 6 6  0 6  PHQ-9 Total Score 20 24 21  -- 22   Flowsheet Row Counselor from 02/16/2023 in [facility] Visit from 04/13/2022 in [facility] Admission (Discharged) from 03/25/2022 in [facility]  C-SSRS RISK CATEGORY No Risk Low Risk No Risk    Collaboration of Care: Collaboration of Care: Medication Management AEB medication prescription and Referral or follow-up with counselor/therapist AEB chart review  Patient/Guardian was advised Release of Information must be obtained prior to any record release in order to collaborate their care with an outside provider. Patient/Guardian was advised if they have not already done so to contact the registration department to sign all necessary forms in order for us  to release information regarding their care.   Consent: Patient/Guardian gives verbal consent for treatment and assignment of benefits for services provided during this visit. Patient/Guardian expressed understanding and agreed to proceed.    Arvella CHRISTELLA Finder, MD 11/01/2023, 9:18 AM

## 2023-11-02 ENCOUNTER — Ambulatory Visit (HOSPITAL_COMMUNITY): Admitting: Psychiatry

## 2023-11-07 ENCOUNTER — Other Ambulatory Visit: Payer: Self-pay | Admitting: Adult Health

## 2023-11-07 DIAGNOSIS — R4189 Other symptoms and signs involving cognitive functions and awareness: Secondary | ICD-10-CM

## 2023-11-07 DIAGNOSIS — I4811 Longstanding persistent atrial fibrillation: Secondary | ICD-10-CM

## 2023-11-07 DIAGNOSIS — I10 Essential (primary) hypertension: Secondary | ICD-10-CM

## 2023-11-07 NOTE — Progress Notes (Deleted)
 [facility]  [name] DOB: [birth year]  REFERRING DOCTOR OR PCP:  *** SOURCE: ***  _________________________________   HISTORICAL  CHIEF COMPLAINT:  No chief complaint on file.   HISTORY OF PRESENT ILLNESS:  ***  Imaging: CT scan of the head 05/02/2021 showed mild generalized cortical atrophy and was otherwise normal for age.  The atrophy is just minimally progressed compared to the 02/06/2016 CT scan.  MRI of the lumbar spine 01/06/2021 showed mild spinal stenosis at L2-L3, borderline spinal stenosis at L3-L4 and mild spinal stenosis at L4-L5 (transverse) there are various degrees of foraminal narrowing or lateral recess stenosis.  Mild to moderate lateral recess stenosis to the right at L3-L4, bilateral moderate lateral recess stenosis at L4-L5.  No definite nerve root compression.  REVIEW OF SYSTEMS: Constitutional: No fevers, chills, sweats, or change in appetite Eyes: No visual changes, double vision, eye pain Ear, nose and throat: No hearing loss, ear pain, nasal congestion, sore throat Cardiovascular: No chest pain, palpitations Respiratory:  No shortness of breath at rest or with exertion.   No wheezes GastrointestinaI: No nausea, vomiting, diarrhea, abdominal pain, fecal incontinence Genitourinary:  No dysuria, urinary retention or frequency.  No nocturia. Musculoskeletal:  No neck pain, back pain Integumentary: No rash, pruritus, skin lesions Neurological: as above Psychiatric: No depression at this time.  No anxiety Endocrine: No palpitations, diaphoresis, change in appetite, change in weigh or increased thirst Hematologic/Lymphatic:  No anemia, purpura, petechiae. Allergic/Immunologic: No itchy/runny eyes, nasal congestion, recent allergic reactions, rashes  ALLERGIES: No Active Allergies  HOME MEDICATIONS:  Current Outpatient Medications:    acetaminophen  (TYLENOL ) 500 MG tablet, Take 1,000 mg by mouth every 6 (six) hours  as needed for moderate pain., Disp: , Rfl:    albuterol  (VENTOLIN  HFA) 108 (90 Base) MCG/ACT inhaler, Inhale 2 puffs into the lungs every 4 (four) hours as needed for wheezing or shortness of breath., Disp: 6.7 g, Rfl: 0   amLODipine  (NORVASC ) 5 MG tablet, TAKE 1 TABLET BY MOUTH IN THE MORNING, Disp: 90 tablet, Rfl: 0   apixaban  (ELIQUIS ) 5 MG TABS tablet, Take 1 tablet (5 mg total) by mouth 2 (two) times daily., Disp: , Rfl:    atorvastatin  (LIPITOR) 40 MG tablet, Take 1 tablet by mouth once daily, Disp: 30 tablet, Rfl: 0   buPROPion  (WELLBUTRIN  XL) 150 MG 24 hr tablet, Take 3 tablets (450 mg total) by mouth every morning., Disp: 90 tablet, Rfl: 2   clotrimazole -betamethasone  (LOTRISONE ) cream, APPLY CREAM TOPICALLY TWICE DAILY, Disp: 60 g, Rfl: 0   diphenhydrAMINE (BENADRYL) 25 MG tablet, Take 25 mg by mouth daily., Disp: , Rfl:    donepezil  (ARICEPT ) 10 MG tablet, TAKE 1 TABLET BY MOUTH ONCE DAILY AT BEDTIME, Disp: 30 tablet, Rfl: 0   DULoxetine  (CYMBALTA ) 60 MG capsule, Take 2 capsules (120 mg total) by mouth every evening., Disp: 180 capsule, Rfl: 0   esomeprazole  (NEXIUM ) 40 MG capsule, Take 1 capsule by mouth in the morning, Disp: 90 capsule, Rfl: 3   famotidine  (PEPCID ) 20 MG tablet, Take 1 tablet by mouth once daily, Disp: 90 tablet, Rfl: 0   finasteride  (PROSCAR ) 5 MG tablet, Take 1 tablet by mouth once daily, Disp: 90 tablet, Rfl: 0   Fluticasone-Umeclidin-Vilant (TRELEGY ELLIPTA ) 100-62.5-25 MCG/ACT AEPB, INHALE 1 PUFF INTO LUNGS ONCE DAILY, Disp: 60 each, Rfl: 0   lurasidone  (LATUDA ) 40 MG TABS tablet, Take 1 tablet (40 mg total) by mouth at bedtime., Disp: 90 tablet, Rfl: 0  memantine  (NAMENDA ) 10 MG tablet, Take 1 tablet (10 mg total) by mouth 2 (two) times daily., Disp: 180 tablet, Rfl: 3   metoprolol  tartrate (LOPRESSOR ) 50 MG tablet, Take 1 tablet by mouth twice daily, Disp: 90 tablet, Rfl: 0   Na Sulfate-K Sulfate-Mg Sulf 17.5-3.13-1.6 GM/177ML SOLN, As directed, Disp: 354 mL,  Rfl: 0   tamsulosin  (FLOMAX ) 0.4 MG CAPS capsule, Take 1 capsule (0.4 mg total) by mouth daily., Disp: 90 capsule, Rfl: 3   traMADol  (ULTRAM ) 50 MG tablet, Take 1 tablet (50 mg total) by mouth every 6 (six) hours as needed for severe pain., Disp: 30 tablet, Rfl: 2   triamcinolone  cream (KENALOG ) 0.1 %, Apply 1 Application topically daily as needed (rash)., Disp: 30 g, Rfl: 2  PAST MEDICAL HISTORY: Past Medical History:  Diagnosis Date   Anxiety    Arthritis    COPD (chronic obstructive pulmonary disease) (HCC)    Depression    Dysrhythmia    GERD (gastroesophageal reflux disease)    Hypertension    PAF (paroxysmal atrial fibrillation) (HCC)    Shortness of breath    with excertion    PAST SURGICAL HISTORY: Past Surgical History:  Procedure Laterality Date   APPENDECTOMY     COLONOSCOPY WITH PROPOFOL  N/A 03/25/2022   Procedure: COLONOSCOPY WITH PROPOFOL ;  Surgeon: Shaaron Lamar HERO, MD;  Location: [facility];  Service: Endoscopy;  Laterality: N/A;  9:00 am   EYE SURGERY     POLYPECTOMY  03/25/2022   Procedure: POLYPECTOMY INTESTINAL;  Surgeon: Shaaron Lamar HERO, MD;  Location: [facility];  Service: Endoscopy;;    FAMILY HISTORY: Family History  Problem Relation Age of Onset   Hypertension Mother    Cancer Father    Hypertension Father    Hypertension Sister    Cancer Brother    Hypertension Brother    Cancer - Colon Neg Hx    Colon polyps Neg Hx     SOCIAL HISTORY: Social History   Socioeconomic History   Marital status: Married    Spouse name: Not on file   Number of children: Not on file   Years of education: Not on file   Highest education level: Not on file  Occupational History   Not on file  Tobacco Use   Smoking status: Former    Current packs/day: 0.00    Types: Cigarettes    Quit date: 01/12/1993    Years since quitting: 30.8   Smokeless tobacco: Never  Vaping Use   Vaping status: Never Used  Substance and Sexual Activity   Alcohol use: No     Comment: Quit 1995   Drug use: No   Sexual activity: Not on file  Other Topics Concern   Not on file  Social History Narrative   Not on file   Social Drivers of Health   Financial Resource Strain: Low Risk  (07/03/2022)   Overall Financial Resource Strain (CARDIA)    Difficulty of Paying Living Expenses: Not hard at all  Food Insecurity: No Food Insecurity (07/03/2022)   Hunger Vital Sign    Worried About Running Out of Food in the Last Year: Never true    Ran Out of Food in the Last Year: Never true  Transportation Needs: No Transportation Needs (07/03/2022)   PRAPARE - Administrator, Civil Service (Medical): No    Lack of Transportation (Non-Medical): No  Physical Activity: Inactive (07/03/2022)   Exercise Vital Sign    Days of Exercise  per Week: 0 days    Minutes of Exercise per Session: 0 min  Stress: No Stress Concern Present (07/03/2022)   [employer] of Occupational Health - Occupational Stress Questionnaire    Feeling of Stress : Not at all  Social Connections: Socially Integrated (07/03/2022)   Social Connection and Isolation Panel    Frequency of Communication with Friends and Family: More than three times a week    Frequency of Social Gatherings with Friends and Family: More than three times a week    Attends Religious Services: More than 4 times per year    Active Member of [employer] or Organizations: Yes    Attends Engineer, Structural: More than 4 times per year    Marital Status: Married  Catering Manager Violence: Not At Risk (07/03/2022)   Humiliation, Afraid, Rape, and Kick questionnaire    Fear of Current or Ex-Partner: No    Emotionally Abused: No    Physically Abused: No    Sexually Abused: No       PHYSICAL EXAM  There were no vitals filed for this visit.  There is no height or weight on file to calculate BMI.   General: The patient is well-developed and well-nourished and in no acute distress  HEENT:  Head is [place]/AT.   Sclera are anicteric.  Funduscopic exam shows normal optic discs and retinal vessels.  Neck: No carotid bruits are noted.  The neck is nontender.  Cardiovascular: The heart has a regular rate and rhythm with a normal S1 and S2. There were no murmurs, gallops or rubs.    Skin: Extremities are without rash or  edema.  Musculoskeletal:  Back is nontender  Neurologic Exam  Mental status: The patient is alert and oriented x 3 at the time of the examination. The patient has apparent normal recent and remote memory, with an apparently normal attention span and concentration ability.   Speech is normal.  Cranial nerves: Extraocular movements are full. Pupils are equal, round, and reactive to light and accomodation.  Visual fields are full.  Facial symmetry is present. There is good facial sensation to soft touch bilaterally.Facial strength is normal.  Trapezius and sternocleidomastoid strength is normal. No dysarthria is noted.  The tongue is midline, and the patient has symmetric elevation of the soft palate. No obvious hearing deficits are noted.  Motor:  Muscle bulk is normal.   Tone is normal. Strength is  5 / 5 in all 4 extremities.   Sensory: Sensory testing is intact to pinprick, soft touch and vibration sensation in all 4 extremities.  Coordination: Cerebellar testing reveals good finger-nose-finger and heel-to-shin bilaterally.  Gait and station: Station is normal.   Gait is normal. Tandem gait is normal. Romberg is negative.   Reflexes: Deep tendon reflexes are symmetric and normal bilaterally.   Plantar responses are flexor.    DIAGNOSTIC DATA (LABS, IMAGING, TESTING) - I reviewed patient records, labs, notes, testing and imaging myself where available.  Lab Results  Component Value Date   WBC 8.7 07/08/2022   HGB 15.0 07/08/2022   HCT 46.5 07/08/2022   MCV 90.9 07/08/2022   PLT 255.0 07/08/2022      Component Value Date/Time   NA 140 07/08/2022 1403   K 4.5 07/08/2022  1403   CL 102 07/08/2022 1403   CO2 30 07/08/2022 1403   GLUCOSE 89 07/08/2022 1403   BUN 18 07/08/2022 1403   CREATININE 1.32 07/08/2022 1403   CALCIUM  9.4 07/08/2022 1403  PROT 6.9 01/08/2023 1528   ALBUMIN 4.2 01/08/2023 1528   AST 13 01/08/2023 1528   ALT 15 01/08/2023 1528   ALKPHOS 93 01/08/2023 1528   BILITOT 0.5 01/08/2023 1528   GFRNONAA >60 03/23/2022 1005   Lab Results  Component Value Date   CHOL 201 (H) 01/08/2023   HDL 50 01/08/2023   LDLCALC 129 (H) 01/08/2023   TRIG 121 01/08/2023   CHOLHDL 4.0 01/08/2023   Lab Results  Component Value Date   HGBA1C 5.7 (A) 01/08/2023   No results found for: [id number] Lab Results  Component Value Date   TSH 2.77 07/08/2022       ASSESSMENT AND PLAN  ***   Citlali Gautney A. Vear, MD, [facility] 11/07/2023, 5:56 PM Certified in Neurology, Clinical Neurophysiology, Sleep Medicine and Neuroimaging  [facility] [address], [address] [place], [place] [address] [phone]

## 2023-11-09 ENCOUNTER — Telehealth: Payer: Self-pay | Admitting: Neurology

## 2023-11-09 ENCOUNTER — Ambulatory Visit: Admitting: Neurology

## 2023-11-09 NOTE — Telephone Encounter (Signed)
 Pt wife called  to cancel appt due to car breaking down   Appt Rescheduled

## 2023-11-17 NOTE — Progress Notes (Unsigned)
 @Patient  ID: Jay [name], male    DOB: 19-Apr-1950, 73 y.o.   MRN: [id number]  No chief complaint on file.   Referring provider: [name], NP  HPI: 73 year old male, former smoker. PMH significant for HTN, COPD, depression/anxiety. Patient of Dr. Jude, seen for initial consult for dyspnea on 11/11/21.  Previous [facility] pulmonary encounter:  12/30/2021 Patient presents today for follow-up for dyspnea with PFTs testing. Accompanied by his wife today. He reports having a hard time breathing sometimes. He runs out of breath easily with exertion or when bending over. Associated wheezing at night and dry cough. He used Trelegy sample and did notice some improvement in symptoms with use. He had a sleep study in the past that was negative, he snores some but has not overt concerns about sleep apnea.    11/18/2023  Discussed the use of AI scribe software for clinical note transcription with the patient, who gave verbal consent to proceed.  History of Present Illness   Overdue follow-up, worsening shortness of breath Last seen on 12/30/2021 Maintained on Trelegy  PFTs in 2023 showed mild obstructive defect with moderate diffusion defect  HRCT in 2023 showed no evidence for ILD. Bland appearing, bandlike scarring bilateral lung bases. Emphysema and diffuse bronchial wall thickening.   Pulmonary function testing: 12/30/2021 >> FVC 3.14 (77%), FEV1 2.30 (77%), ratio 73, TLC 93%, DLCOunc 15.66 (64%)  Imaging: 12/17/21 HRCT >> No evidence of fibrotic interstitial lung disease. Bland appearing, bandlike scarring of the bilateral lung bases, right-greater-than-left. Emphysema and diffuse bilateral bronchial wall thickening.  Cardiac testing: 11/27/21 Echocardiogram >> EF 55-60%, unable to evaluate diastolic function     No Active Allergies  Immunization History  Administered Date(s) Administered   Influenza-Unspecified 09/11/2021   PFIZER(Purple Top)SARS-COV-2 Vaccination  08/31/2019, 09/21/2019   Pneumococcal-Unspecified 09/11/2021    Past Medical History:  Diagnosis Date   Anxiety    Arthritis    COPD (chronic obstructive pulmonary disease) (HCC)    Depression    Dysrhythmia    GERD (gastroesophageal reflux disease)    Hypertension    PAF (paroxysmal atrial fibrillation) (HCC)    Shortness of breath    with excertion    Tobacco History: Social History   Tobacco Use  Smoking Status Former   Current packs/day: 0.00   Types: Cigarettes   Quit date: 01/12/1993   Years since quitting: 30.8  Smokeless Tobacco Never   Counseling given: Not Answered   Outpatient Medications Prior to Visit  Medication Sig Dispense Refill   acetaminophen  (TYLENOL ) 500 MG tablet Take 1,000 mg by mouth every 6 (six) hours as needed for moderate pain.     albuterol  (VENTOLIN  HFA) 108 (90 Base) MCG/ACT inhaler Inhale 2 puffs into the lungs every 4 (four) hours as needed for wheezing or shortness of breath. 6.7 g 0   amLODipine  (NORVASC ) 5 MG tablet TAKE 1 TABLET BY MOUTH IN THE MORNING 90 tablet 0   apixaban  (ELIQUIS ) 5 MG TABS tablet Take 1 tablet (5 mg total) by mouth 2 (two) times daily.     atorvastatin  (LIPITOR) 40 MG tablet Take 1 tablet by mouth once daily 30 tablet 0   buPROPion  (WELLBUTRIN  XL) 150 MG 24 hr tablet Take 3 tablets (450 mg total) by mouth every morning. 90 tablet 2   clotrimazole -betamethasone  (LOTRISONE ) cream APPLY CREAM TOPICALLY TWICE DAILY 60 g 0   diphenhydrAMINE (BENADRYL) 25 MG tablet Take 25 mg by mouth daily.     donepezil  (ARICEPT ) 10  MG tablet TAKE 1 TABLET BY MOUTH ONCE DAILY AT BEDTIME 30 tablet 0   DULoxetine  (CYMBALTA ) 60 MG capsule Take 2 capsules (120 mg total) by mouth every evening. 180 capsule 0   esomeprazole  (NEXIUM ) 40 MG capsule Take 1 capsule by mouth in the morning 90 capsule 3   famotidine  (PEPCID ) 20 MG tablet Take 1 tablet by mouth once daily 90 tablet 0   finasteride  (PROSCAR ) 5 MG tablet Take 1 tablet by mouth once  daily 90 tablet 0   Fluticasone-Umeclidin-Vilant (TRELEGY ELLIPTA ) 100-62.5-25 MCG/ACT AEPB INHALE 1 PUFF INTO LUNGS ONCE DAILY 60 each 0   lurasidone  (LATUDA ) 40 MG TABS tablet Take 1 tablet (40 mg total) by mouth at bedtime. 90 tablet 0   memantine  (NAMENDA ) 10 MG tablet Take 1 tablet (10 mg total) by mouth 2 (two) times daily. 180 tablet 3   metoprolol  tartrate (LOPRESSOR ) 50 MG tablet Take 1 tablet by mouth twice daily 90 tablet 0   Na Sulfate-K Sulfate-Mg Sulf 17.5-3.13-1.6 GM/177ML SOLN As directed 354 mL 0   tamsulosin  (FLOMAX ) 0.4 MG CAPS capsule Take 1 capsule (0.4 mg total) by mouth daily. 90 capsule 3   traMADol  (ULTRAM ) 50 MG tablet Take 1 tablet (50 mg total) by mouth every 6 (six) hours as needed for severe pain. 30 tablet 2   triamcinolone  cream (KENALOG ) 0.1 % Apply 1 Application topically daily as needed (rash). 30 g 2   No facility-administered medications prior to visit.      Review of Systems  Review of Systems   Physical Exam  There were no vitals taken for this visit. Physical Exam  ***  Lab Results:  CBC    Component Value Date/Time   WBC 8.7 07/08/2022 1403   RBC 5.12 07/08/2022 1403   HGB 15.0 07/08/2022 1403   HCT 46.5 07/08/2022 1403   PLT 255.0 07/08/2022 1403   MCV 90.9 07/08/2022 1403   MCH 29.5 10/03/2021 1301   MCHC 32.2 07/08/2022 1403   RDW 14.9 07/08/2022 1403    BMET    Component Value Date/Time   NA 140 07/08/2022 1403   K 4.5 07/08/2022 1403   CL 102 07/08/2022 1403   CO2 30 07/08/2022 1403   GLUCOSE 89 07/08/2022 1403   BUN 18 07/08/2022 1403   CREATININE 1.32 07/08/2022 1403   CALCIUM  9.4 07/08/2022 1403   GFRNONAA >60 03/23/2022 1005    BNP    Component Value Date/Time   BNP 175.0 (H) 10/03/2021 1329    ProBNP No results found for: PROBNP  Imaging: No results found.   Assessment & Plan:   No problem-specific Assessment & Plan notes found for this encounter.   There are no diagnoses linked to this  encounter.  Assessment and Plan Assessment & Plan       I personally spent a total of *** minutes in the care of the patient today including {Time Based Coding:210964241}.   Almarie LELON Ferrari, NP 11/17/2023

## 2023-11-18 ENCOUNTER — Ambulatory Visit (INDEPENDENT_AMBULATORY_CARE_PROVIDER_SITE_OTHER)

## 2023-11-18 ENCOUNTER — Ambulatory Visit: Admitting: Primary Care

## 2023-11-18 ENCOUNTER — Encounter: Payer: Self-pay | Admitting: Primary Care

## 2023-11-18 VITALS — BP 126/66 | HR 123 | Temp 97.6°F | Ht 68.5 in | Wt 210.0 lb

## 2023-11-18 DIAGNOSIS — R0602 Shortness of breath: Secondary | ICD-10-CM

## 2023-11-18 LAB — BASIC METABOLIC PANEL WITH GFR
BUN: 22 mg/dL (ref 6–23)
CO2: 26 meq/L (ref 19–32)
Calcium: 9.4 mg/dL (ref 8.4–10.5)
Chloride: 105 meq/L (ref 96–112)
Creatinine, Ser: 1.32 mg/dL (ref 0.40–1.50)
GFR: 53.64 mL/min — ABNORMAL LOW (ref >60.00)
Glucose, Bld: 103 mg/dL — ABNORMAL HIGH (ref 70–99)
Potassium: 4.9 meq/L (ref 3.5–5.1)
Sodium: 143 meq/L (ref 135–145)

## 2023-11-18 LAB — CBC WITH DIFFERENTIAL/PLATELET
Basophils Absolute: 0.1 K/uL (ref 0.0–0.1)
Basophils Relative: 0.6 % (ref 0.0–3.0)
Eosinophils Absolute: 0.1 K/uL (ref 0.0–0.7)
Eosinophils Relative: 1.1 % (ref 0.0–5.0)
HCT: 42 % (ref 39.0–52.0)
Hemoglobin: 13.9 g/dL (ref 13.0–17.0)
Lymphocytes Relative: 12.8 % (ref 12.0–46.0)
Lymphs Abs: 1.1 K/uL (ref 0.7–4.0)
MCHC: 33.2 g/dL (ref 30.0–36.0)
MCV: 92.5 fl (ref 78.0–100.0)
Monocytes Absolute: 0.6 K/uL (ref 0.1–1.0)
Monocytes Relative: 7.2 % (ref 3.0–12.0)
Neutro Abs: 6.5 K/uL (ref 1.4–7.7)
Neutrophils Relative %: 78.3 % — ABNORMAL HIGH (ref 43.0–77.0)
Platelets: 223 K/uL (ref 150.0–400.0)
RBC: 4.54 Mil/uL (ref 4.22–5.81)
RDW: 15.1 % (ref 11.5–15.5)
WBC: 8.3 K/uL (ref 4.0–10.5)

## 2023-11-18 MED ORDER — TRELEGY ELLIPTA 100-62.5-25 MCG/ACT IN AEPB: 1.0000 | INHALATION_SPRAY | Freq: Every day | RESPIRATORY_TRACT | Status: DC

## 2023-11-18 NOTE — Patient Instructions (Signed)
  VISIT SUMMARY: Today, we discussed your worsening shortness of breath, cough, and fatigue. We reviewed your history of COPD, emphysema, and atrial fibrillation, and addressed your current symptoms and concerns. We also talked about your previous skull fracture and its impact on your overall health.  YOUR PLAN: -CHRONIC OBSTRUCTIVE PULMONARY DISEASE (COPD) WITH EMPHYSEMA: COPD with emphysema is a chronic lung condition that makes it hard to breathe. We provided you with samples of the Trelegy inhaler and will work with the pharmacy to find an affordable alternative. Please use the inhaler as directed and follow up in 4-6 weeks to reassess your symptoms.  -ATRIAL FIBRILLATION: Atrial fibrillation is an irregular and often rapid heart rate that can lead to poor blood flow. We need to confirm if you are currently taking Eliquis  and metoprolol  and may need to adjust your treatment. Please follow up with your cardiologist to review your heart medications.  -FATIGUE: Fatigue is extreme tiredness that can be caused by various factors, including your heart condition and lung issues. We will address this by managing your atrial fibrillation and COPD as discussed.  INSTRUCTIONS: Please follow up in 4-6 weeks to reassess your COPD symptoms and inhaler efficacy. Additionally, schedule an appointment with your cardiologist to review your heart medications and confirm your use of Eliquis .  Follow-up 4-6 weeks with Landry NP

## 2023-11-19 ENCOUNTER — Encounter: Payer: Self-pay | Admitting: Adult Health

## 2023-11-19 ENCOUNTER — Ambulatory Visit: Admitting: Adult Health

## 2023-11-19 VITALS — BP 110/80 | HR 94 | Temp 97.7°F | Ht 68.5 in | Wt 211.0 lb

## 2023-11-19 DIAGNOSIS — Z0000 Encounter for general adult medical examination without abnormal findings: Secondary | ICD-10-CM

## 2023-11-19 DIAGNOSIS — Z23 Encounter for immunization: Secondary | ICD-10-CM | POA: Diagnosis not present

## 2023-11-19 DIAGNOSIS — F32A Depression, unspecified: Secondary | ICD-10-CM

## 2023-11-19 DIAGNOSIS — R7303 Prediabetes: Secondary | ICD-10-CM | POA: Diagnosis not present

## 2023-11-19 DIAGNOSIS — R2681 Unsteadiness on feet: Secondary | ICD-10-CM

## 2023-11-19 DIAGNOSIS — E785 Hyperlipidemia, unspecified: Secondary | ICD-10-CM

## 2023-11-19 DIAGNOSIS — N401 Enlarged prostate with lower urinary tract symptoms: Secondary | ICD-10-CM

## 2023-11-19 DIAGNOSIS — J449 Chronic obstructive pulmonary disease, unspecified: Secondary | ICD-10-CM

## 2023-11-19 DIAGNOSIS — I4811 Longstanding persistent atrial fibrillation: Secondary | ICD-10-CM

## 2023-11-19 DIAGNOSIS — M545 Low back pain, unspecified: Secondary | ICD-10-CM | POA: Diagnosis not present

## 2023-11-19 DIAGNOSIS — R4189 Other symptoms and signs involving cognitive functions and awareness: Secondary | ICD-10-CM

## 2023-11-19 DIAGNOSIS — R251 Tremor, unspecified: Secondary | ICD-10-CM

## 2023-11-19 DIAGNOSIS — F419 Anxiety disorder, unspecified: Secondary | ICD-10-CM

## 2023-11-19 DIAGNOSIS — G8929 Other chronic pain: Secondary | ICD-10-CM

## 2023-11-19 DIAGNOSIS — H9193 Unspecified hearing loss, bilateral: Secondary | ICD-10-CM

## 2023-11-19 DIAGNOSIS — I10 Essential (primary) hypertension: Secondary | ICD-10-CM | POA: Diagnosis not present

## 2023-11-19 LAB — D-DIMER, QUANTITATIVE: D-Dimer, Quant: 0.63 ug{FEU}/mL — ABNORMAL HIGH (ref <0.50)

## 2023-11-19 MED ORDER — ATORVASTATIN CALCIUM 40 MG PO TABS: 40.0000 mg | ORAL_TABLET | Freq: Every day | ORAL | 3 refills | Status: AC

## 2023-11-19 MED ORDER — ESOMEPRAZOLE MAGNESIUM 40 MG PO CPDR: 40.0000 mg | DELAYED_RELEASE_CAPSULE | Freq: Every morning | ORAL | 3 refills | Status: AC

## 2023-11-19 MED ORDER — TAMSULOSIN HCL 0.4 MG PO CAPS
0.4000 mg | ORAL_CAPSULE | Freq: Every day | ORAL | 3 refills | Status: AC
Start: 2023-11-19 — End: ?

## 2023-11-19 MED ORDER — AMLODIPINE BESYLATE 5 MG PO TABS: 5.0000 mg | ORAL_TABLET | Freq: Every morning | ORAL | 3 refills | Status: DC

## 2023-11-19 MED ORDER — TRAMADOL HCL 50 MG PO TABS: 50.0000 mg | ORAL_TABLET | Freq: Four times a day (QID) | ORAL | 2 refills | Status: AC | PRN

## 2023-11-19 MED ORDER — METOPROLOL TARTRATE 50 MG PO TABS: 50.0000 mg | ORAL_TABLET | Freq: Two times a day (BID) | ORAL | 3 refills | Status: AC

## 2023-11-19 MED ORDER — DONEPEZIL HCL 10 MG PO TABS: 10.0000 mg | ORAL_TABLET | Freq: Every day | ORAL | 3 refills | Status: AC

## 2023-11-19 NOTE — Progress Notes (Signed)
 Subjective:    Patient ID: Jay [name], male    DOB: Jan 24, 1950, 73 y.o.   MRN: [id number]  HPI Patient presents for yearly preventative medicine examination. He is a pleasant 73 year old male who  has a past medical history of Anxiety, Arthritis, COPD (chronic obstructive pulmonary disease) (HCC), Depression, Dysrhythmia, GERD (gastroesophageal reflux disease), Hypertension, PAF (paroxysmal atrial fibrillation) (HCC), and Shortness of breath.  His daughter is with him today   Prediabetes -  Not currently on medication. He has not been exercising or eating healthy.  Lab Results  Component Value Date   HGBA1C 5.7 (A) 01/08/2023   HGBA1C 6.1 07/08/2022   Hyperlipidemia - Currently managed with Lipitor 40 mg daily. He denies myalgia or fatigue  Lab Results  Component Value Date   CHOL 201 (H) 01/08/2023   HDL 50 01/08/2023   LDLCALC 129 (H) 01/08/2023   TRIG 121 01/08/2023   CHOLHDL 4.0 01/08/2023   Hypertension-managed with Norvasc  5 mg daily and metoprolol  25 mg twice daily. He denies dizziness, lightheadedness or blurred vision  BP Readings from Last 3 Encounters:  11/19/23 110/80  11/18/23 126/66  03/09/23 124/74   Atrial Fibrillation -diagnosed in April 2023.  Currently managed with Eliquis  5 mg BID  and metoprolol  25 mg BID.  He is followed by cardiology  Anxiety/Depression -managed by psychiatry with Cymbalta  60 mg QHS, Wellbutrin  450 mg daily, and Latuda  40 daily.   Cognitive Impairment - managed with Aricept  10 mg daily and Namenda  10 mg daily. Feels as though his memory has been stable.   COPD - Currently maintained Trelegy Ellipta  100-62.5-25mcg . He is seen by pulmonary. He feels as though his symptoms are controleld with Trelegy.   Chronic back pain - needs a refill of Tramadol , he has not had any in  along time. He has been in pain for quite some time. Active makes the pain worse and he does not want to exercise due to the pain. [facility] but is interested in seeing someone else for chronic back pain. His last MRI of lumbar spine was in 12/2020 which showed IMPRESSION: 1. Mild multilevel lumbar degenerative disc disease without spinal canal or neural foraminal stenosis. 2. Moderate left L4-5 and left L5-S1 facet arthrosis. 3. Fatty infiltration of the right paraspinous muscles.  BPH -  He was placed on Proscar  5 mg daily about 6 months ago  He continues to have decreased stream, nocturia getting up 2-3 times a night, urinary incontinence, and incomplete bladder emptying. Does not sound like he continued with Flomax   Hearing Loss - reports hearing loss bilaterally L>R. He would like to see someone about hearing aids   Gait Instability -his daughter reports that he has been very unsteady walking.  They have had to start using a wheelchair when he leaves the house.  His daughter reports that when he gets home that he sits in his ED chair all day all night and does not do any form of exercise.  Patient reports feeling very weak and has a hard time walking feeling as though he is going to fall he does walk.  Tremors -has been present over the year, tremors with shear.  His daughter reports it is hard for him to eat because he cannot hold his utensils and cannot hold any type of cups without elevating or dropping them.  Tremors seem to be present continuously. Family reports upcoming appointment with neurology  All immunizations and health maintenance protocols were reviewed with the patient and needed orders were placed. Refused Shingles and Tdap. Willg et flu shot today   Appropriate screening laboratory values were ordered for the patient including screening of hyperlipidemia, renal function and hepatic function. If indicated by BPH, a PSA was ordered.  Medication reconciliation,  past medical history, social history, problem list and allergies were reviewed in detail with the patient  Goals were established  with regard to weight loss, exercise, and  diet in compliance with medications Wt Readings from Last 3 Encounters:  11/19/23 211 lb (95.7 kg)  11/18/23 210 lb (95.3 kg)  06/22/23 198 lb (89.8 kg)    Review of Systems  Constitutional:  Positive for fatigue.  HENT:  Positive for hearing loss.   Eyes: Negative.   Respiratory: Negative.    Cardiovascular: Negative.   Gastrointestinal: Negative.   Endocrine: Negative.   Genitourinary:  Positive for decreased urine volume.  Musculoskeletal:  Positive for arthralgias, back pain and gait problem.  Skin: Negative.   Allergic/Immunologic: Negative.   Neurological:  Positive for tremors and weakness.  Hematological: Negative.   Psychiatric/Behavioral: Negative.    All other systems reviewed and are negative.  Past Medical History:  Diagnosis Date   Anxiety    Arthritis    COPD (chronic obstructive pulmonary disease) (HCC)    Depression    Dysrhythmia    GERD (gastroesophageal reflux disease)    Hypertension    PAF (paroxysmal atrial fibrillation) (HCC)    Shortness of breath    with excertion    Social History   Socioeconomic History   Marital status: Married    Spouse name: Not on file   Number of children: Not on file   Years of education: Not on file   Highest education level: 9th grade  Occupational History   Not on file  Tobacco Use   Smoking status: Former    Current packs/day: 0.00    Types: Cigarettes    Quit date: 01/12/1993    Years since quitting: 30.8   Smokeless tobacco: Never  Vaping Use   Vaping status: Never Used  Substance and Sexual Activity   Alcohol use: No    Comment: Quit 1995   Drug use: No   Sexual activity: Not on file  Other Topics Concern   Not on file  Social History Narrative   Not on file   Social Drivers of Health   Financial Resource Strain: Medium Risk (11/18/2023)   Overall Financial Resource Strain (CARDIA)    Difficulty of Paying Living Expenses: Somewhat hard  Food  Insecurity: No Food Insecurity (11/18/2023)   Hunger Vital Sign    Worried About Running Out of Food in the Last Year: Never true    Ran Out of Food in the Last Year: Never true  Transportation Needs: No Transportation Needs (11/18/2023)   PRAPARE - Administrator, Civil Service (Medical): No    Lack of Transportation (Non-Medical): No  Physical Activity: Inactive (11/18/2023)   Exercise Vital Sign    Days of Exercise per Week: 0 days    Minutes of Exercise per Session: Not on file  Stress: Stress Concern Present (11/18/2023)   [employer] of Occupational Health - Occupational Stress Questionnaire    Feeling of Stress: Very much  Social Connections: Moderately Integrated (11/18/2023)   Social Connection and Isolation Panel    Frequency of Communication with Friends and Family: Twice a week    Frequency  of Social Gatherings with Friends and Family: Once a week    Attends Religious Services: More than 4 times per year    Active Member of [employer] or Organizations: No    Attends Banker Meetings: Not on file    Marital Status: Married  Intimate Partner Violence: Not At Risk (07/03/2022)   Humiliation, Afraid, Rape, and Kick questionnaire    Fear of Current or Ex-Partner: No    Emotionally Abused: No    Physically Abused: No    Sexually Abused: No    Past Surgical History:  Procedure Laterality Date   APPENDECTOMY     COLONOSCOPY WITH PROPOFOL  N/A 03/25/2022   Procedure: COLONOSCOPY WITH PROPOFOL ;  Surgeon: Shaaron Lamar HERO, MD;  Location: [facility];  Service: Endoscopy;  Laterality: N/A;  9:00 am   EYE SURGERY     POLYPECTOMY  03/25/2022   Procedure: POLYPECTOMY INTESTINAL;  Surgeon: Shaaron Lamar HERO, MD;  Location: [facility];  Service: Endoscopy;;    Family History  Problem Relation Age of Onset   Hypertension Mother    Cancer Father    Hypertension Father    Hypertension Sister    Cancer Brother    Hypertension Brother    Cancer - Colon Neg Hx     Colon polyps Neg Hx     No Active Allergies  Current Outpatient Medications on File Prior to Visit  Medication Sig Dispense Refill   acetaminophen  (TYLENOL ) 500 MG tablet Take 1,000 mg by mouth every 6 (six) hours as needed for moderate pain.     albuterol  (VENTOLIN  HFA) 108 (90 Base) MCG/ACT inhaler Inhale 2 puffs into the lungs every 4 (four) hours as needed for wheezing or shortness of breath. 6.7 g 0   amLODipine  (NORVASC ) 5 MG tablet TAKE 1 TABLET BY MOUTH IN THE MORNING 90 tablet 0   apixaban  (ELIQUIS ) 5 MG TABS tablet Take 1 tablet (5 mg total) by mouth 2 (two) times daily.     atorvastatin  (LIPITOR) 40 MG tablet Take 1 tablet by mouth once daily 30 tablet 0   buPROPion  (WELLBUTRIN  XL) 150 MG 24 hr tablet Take 3 tablets (450 mg total) by mouth every morning. 90 tablet 2   clotrimazole -betamethasone  (LOTRISONE ) cream APPLY CREAM TOPICALLY TWICE DAILY 60 g 0   diphenhydrAMINE (BENADRYL) 25 MG tablet Take 25 mg by mouth daily.     donepezil  (ARICEPT ) 10 MG tablet TAKE 1 TABLET BY MOUTH ONCE DAILY AT BEDTIME 30 tablet 0   DULoxetine  (CYMBALTA ) 60 MG capsule Take 2 capsules (120 mg total) by mouth every evening. 180 capsule 0   esomeprazole  (NEXIUM ) 40 MG capsule Take 1 capsule by mouth in the morning 90 capsule 3   famotidine  (PEPCID ) 20 MG tablet Take 1 tablet by mouth once daily 90 tablet 0   finasteride  (PROSCAR ) 5 MG tablet Take 1 tablet by mouth once daily 90 tablet 0   Fluticasone-Umeclidin-Vilant (TRELEGY ELLIPTA ) 100-62.5-25 MCG/ACT AEPB INHALE 1 PUFF INTO LUNGS ONCE DAILY 60 each 0   Fluticasone-Umeclidin-Vilant (TRELEGY ELLIPTA ) 100-62.5-25 MCG/ACT AEPB Inhale 1 puff into the lungs daily.     lurasidone  (LATUDA ) 40 MG TABS tablet Take 1 tablet (40 mg total) by mouth at bedtime. 90 tablet 0   memantine  (NAMENDA ) 10 MG tablet Take 1 tablet (10 mg total) by mouth 2 (two) times daily. 180 tablet 3   metoprolol  tartrate (LOPRESSOR ) 50 MG tablet Take 1 tablet by mouth twice daily 90  tablet 0   Na  Sulfate-K Sulfate-Mg Sulf 17.5-3.13-1.6 GM/177ML SOLN As directed 354 mL 0   tamsulosin  (FLOMAX ) 0.4 MG CAPS capsule Take 1 capsule (0.4 mg total) by mouth daily. 90 capsule 3   traMADol  (ULTRAM ) 50 MG tablet Take 1 tablet (50 mg total) by mouth every 6 (six) hours as needed for severe pain. 30 tablet 2   triamcinolone  cream (KENALOG ) 0.1 % Apply 1 Application topically daily as needed (rash). 30 g 2   No current facility-administered medications on file prior to visit.    BP 110/80   Pulse 94   Temp 97.7 F (36.5 C) (Oral)   Ht 5' 8.5 (1.74 m)   Wt 211 lb (95.7 kg)   SpO2 97%   BMI 31.62 kg/m       Objective:   Physical Exam Vitals and nursing note reviewed.  Constitutional:      General: He is not in acute distress.    Appearance: Normal appearance. He is obese. He is ill-appearing (chronically).  HENT:     Head: Normocephalic and atraumatic.     Right Ear: Tympanic membrane, ear canal and external ear normal. There is no impacted cerumen.     Left Ear: Tympanic membrane, ear canal and external ear normal. There is no impacted cerumen.     Nose: Nose normal. No congestion or rhinorrhea.     Mouth/Throat:     Mouth: Mucous membranes are moist.     Pharynx: Oropharynx is clear.  Eyes:     Extraocular Movements: Extraocular movements intact.     Conjunctiva/sclera: Conjunctivae normal.     Pupils: Pupils are equal, round, and reactive to light.  Neck:     Vascular: No carotid bruit.  Cardiovascular:     Rate and Rhythm: Normal rate. Rhythm regularly irregular.     Pulses: Normal pulses.     Heart sounds: No murmur heard.    No friction rub. No gallop.  Pulmonary:     Effort: Pulmonary effort is normal.     Breath sounds: Normal breath sounds.  Abdominal:     General: Abdomen is flat. Bowel sounds are normal. There is no distension.     Palpations: Abdomen is soft. There is no mass.     Tenderness: There is no abdominal tenderness. There is no guarding  or rebound.     Hernia: No hernia is present.  Musculoskeletal:        General: Normal range of motion.     Cervical back: Normal range of motion and neck supple.  Lymphadenopathy:     Cervical: No cervical adenopathy.  Skin:    General: Skin is warm and dry.     Capillary Refill: Capillary refill takes less than 2 seconds.  Neurological:     General: No focal deficit present.     Mental Status: He is alert and oriented to person, place, and time.     Cranial Nerves: No facial asymmetry.     Motor: Tremor present.     Coordination: Coordination abnormal (unstead gait. Needed two person assist to get on exam table.).     Gait: Gait abnormal.     Comments: Tremor noted at rest but becomes worse with movement. + Cogwheel   Psychiatric:        Mood and Affect: Mood normal.        Behavior: Behavior normal.        Thought Content: Thought content normal.        Judgment: Judgment normal.  Assessment & Plan:  1. Routine general medical examination at a health care facility (Primary) Today patient counseled on age appropriate routine health concerns for screening and prevention, each reviewed and up to date or declined. Immunizations reviewed and up to date or declined. Labs ordered and reviewed. Risk factors for depression reviewed and negative. Hearing function and visual acuity are intact. ADLs screened and addressed as needed. Functional ability and level of safety reviewed and appropriate. Education, counseling and referrals performed based on assessed risks today. Patient provided with a copy of personalized plan for preventive services. Encouraged exercise and eating healthy  Follow up in one year or sooner if needed  2. Prediabetes - Consider metformin  - Lipid panel; Future - TSH; Future - CBC; Future - Comprehensive metabolic panel with GFR; Future - PSA; Future - Hemoglobin A1c; Future - Hemoglobin A1c - Comprehensive metabolic panel with GFR - CBC - TSH - Lipid  panel  3. Hyperlipidemia, unspecified hyperlipidemia type - Continue statin  - Lipid panel; Future - TSH; Future - CBC; Future - Comprehensive metabolic panel with GFR; Future - PSA; Future - Comprehensive metabolic panel with GFR - CBC - TSH - Lipid panel  4. Primary hypertension - Controlled. No change in medication  - Lipid panel; Future - TSH; Future - CBC; Future - Comprehensive metabolic panel with GFR; Future - PSA; Future - Comprehensive metabolic panel with GFR - CBC - TSH - Lipid panel - amLODipine  (NORVASC ) 5 MG tablet; Take 1 tablet (5 mg total) by mouth every morning.  Dispense: 90 tablet; Refill: 3 - metoprolol  tartrate (LOPRESSOR ) 50 MG tablet; Take 1 tablet (50 mg total) by mouth 2 (two) times daily.  Dispense: 90 tablet; Refill: 3  5. Longstanding persistent atrial fibrillation [facility]) - Per Cardiology. Continue BB and Eliquis   - Lipid panel; Future - TSH; Future - CBC; Future - Comprehensive metabolic panel with GFR; Future - PSA; Future - Comprehensive metabolic panel with GFR - CBC - TSH - Lipid panel - metoprolol  tartrate (LOPRESSOR ) 50 MG tablet; Take 1 tablet (50 mg total) by mouth 2 (two) times daily.  Dispense: 90 tablet; Refill: 3  6. Anxiety and depression - Per psychiatry  - Lipid panel; Future - TSH; Future - CBC; Future - Comprehensive metabolic panel with GFR; Future - PSA; Future - Comprehensive metabolic panel with GFR - CBC - TSH - Lipid panel  7. Cognitive impairment - Continue Aricept  and Namenda   - Lipid panel; Future - TSH; Future - CBC; Future - Comprehensive metabolic panel with GFR; Future - PSA; Future - Comprehensive metabolic panel with GFR - CBC - TSH - Lipid panel - donepezil  (ARICEPT ) 10 MG tablet; Take 1 tablet (10 mg total) by mouth at bedtime.  Dispense: 90 tablet; Refill: 3  8. Chronic obstructive pulmonary disease, unspecified COPD type (HCC) - Per pulmonary  - Lipid panel; Future - TSH; Future -  CBC; Future - Comprehensive metabolic panel with GFR; Future - PSA; Future - Comprehensive metabolic panel with GFR - CBC - TSH - Lipid panel  9. Chronic midline low back pain without sciatica  - Lipid panel; Future - TSH; Future - CBC; Future - Comprehensive metabolic panel with GFR; Future - PSA; Future - Comprehensive metabolic panel with GFR - CBC - TSH - Lipid panel - traMADol  (ULTRAM ) 50 MG tablet; Take 1 tablet (50 mg total) by mouth every 6 (six) hours as needed for severe pain (pain score 7-10).  Dispense: 30 tablet; Refill: 2  10.  Bilateral hearing loss, unspecified hearing loss type  - Ambulatory referral to Audiology  11. Gait instability - Leads a sedentary lifestyle. Encouraged to walk even short distances in the house with walker or cane - Ambulatory referral to Physical Therapy  12. Tremors of nervous system - Concern for Parkinsons disease. Follow up with Neurology as directed   13. BPH with lower urinary tract symptoms without urinary obstruction  - PSA - tamsulosin  (FLOMAX ) 0.4 MG CAPS capsule; Take 1 capsule (0.4 mg total) by mouth daily.  Dispense: 90 capsule; Refill: 3  14. Need for influenza vaccination  - Flu vaccine HIGH DOSE PF(Fluzone  Trivalent)  Amrom Ore, NP

## 2023-11-20 LAB — CBC
HCT: 42.5 % (ref 38.5–50.0)
Hemoglobin: 13.9 g/dL (ref 13.2–17.1)
MCH: 30.8 pg (ref 27.0–33.0)
MCHC: 32.7 g/dL (ref 32.0–36.0)
MCV: 94 fL (ref 80.0–100.0)
MPV: 11.5 fL (ref 7.5–12.5)
Platelets: 266 Thousand/uL (ref 140–400)
RBC: 4.52 Million/uL (ref 4.20–5.80)
RDW: 13.3 % (ref 11.0–15.0)
WBC: 8.8 Thousand/uL (ref 3.8–10.8)

## 2023-11-20 LAB — LIPID PANEL
Cholesterol: 203 mg/dL — ABNORMAL HIGH (ref <200)
HDL: 55 mg/dL (ref >=40)
LDL Cholesterol (Calc): 117 mg/dL — ABNORMAL HIGH
Non-HDL Cholesterol (Calc): 148 mg/dL — ABNORMAL HIGH (ref <130)
Total CHOL/HDL Ratio: 3.7 (calc) (ref <5.0)
Triglycerides: 185 mg/dL — ABNORMAL HIGH (ref <150)

## 2023-11-20 LAB — COMPREHENSIVE METABOLIC PANEL WITH GFR
AG Ratio: 1.6 (calc) (ref 1.0–2.5)
ALT: 16 U/L (ref 9–46)
AST: 13 U/L (ref 10–35)
Albumin: 4.2 g/dL (ref 3.6–5.1)
Alkaline phosphatase (APISO): 61 U/L (ref 35–144)
BUN/Creatinine Ratio: 19 (calc) (ref 6–22)
BUN: 25 mg/dL (ref 7–25)
CO2: 24 mmol/L (ref 20–32)
Calcium: 9.3 mg/dL (ref 8.6–10.3)
Chloride: 106 mmol/L (ref 98–110)
Creat: 1.31 mg/dL — ABNORMAL HIGH (ref 0.70–1.28)
Globulin: 2.7 g/dL (ref 1.9–3.7)
Glucose, Bld: 95 mg/dL (ref 65–99)
Potassium: 4.6 mmol/L (ref 3.5–5.3)
Sodium: 141 mmol/L (ref 135–146)
Total Bilirubin: 0.5 mg/dL (ref 0.2–1.2)
Total Protein: 6.9 g/dL (ref 6.1–8.1)
eGFR: 57 mL/min/1.73m2 — ABNORMAL LOW (ref >=60)

## 2023-11-20 LAB — HEMOGLOBIN A1C
Hgb A1c MFr Bld: 5.8 % — ABNORMAL HIGH (ref <5.7)
Mean Plasma Glucose: 120 mg/dL
eAG (mmol/L): 6.6 mmol/L

## 2023-11-20 LAB — TSH: TSH: 2.44 m[IU]/L (ref 0.40–4.50)

## 2023-11-20 LAB — PSA: PSA: 0.19 ng/mL (ref <=4.00)

## 2023-11-22 ENCOUNTER — Ambulatory Visit: Payer: Self-pay | Admitting: Primary Care

## 2023-11-22 ENCOUNTER — Encounter: Payer: Self-pay | Admitting: Primary Care

## 2023-11-22 DIAGNOSIS — R7989 Other specified abnormal findings of blood chemistry: Secondary | ICD-10-CM

## 2023-11-22 NOTE — Progress Notes (Signed)
 Please let patient know CXR showed stable scarring, no pleural effusion or acute process to explain shortness of breath. D-dimer was elevated, please make sure he gets CTA

## 2023-11-22 NOTE — Progress Notes (Signed)
 D-dimer was elevated, needs stat CTA please order

## 2023-11-23 ENCOUNTER — Ambulatory Visit
Admission: RE | Admit: 2023-11-23 | Discharge: 2023-11-23 | Disposition: A | Discharge status: Home / Self Care | Source: Ambulatory Visit | Attending: Primary Care | Admitting: Primary Care

## 2023-11-23 ENCOUNTER — Ambulatory Visit: Payer: Self-pay | Admitting: Primary Care

## 2023-11-23 ENCOUNTER — Ambulatory Visit: Payer: Self-pay | Admitting: Adult Health

## 2023-11-23 DIAGNOSIS — R0602 Shortness of breath: Secondary | ICD-10-CM | POA: Diagnosis not present

## 2023-11-23 DIAGNOSIS — R7989 Other specified abnormal findings of blood chemistry: Secondary | ICD-10-CM

## 2023-11-23 MED ORDER — IOPAMIDOL (ISOVUE-370) INJECTION 76%
80.0000 mL | Freq: Once | INTRAVENOUS | Status: AC | PRN
  Administered 2023-11-23: 80 mL via INTRAVENOUS

## 2023-11-23 NOTE — Progress Notes (Signed)
 His lab work showed a slightly decreased kidney function- please make sure he is staying hydrated  His cholesterol panel is also elevated - please make sure he is taking his statin '  The rest of his labs are normal

## 2023-11-26 ENCOUNTER — Telehealth: Payer: Self-pay | Admitting: Cardiology

## 2023-11-26 NOTE — Telephone Encounter (Signed)
 11/23/23 chest ct angio/chest pulmonary embolism  MPRESSION: 1. No evidence of acute pulmonary embolism or other acute chest findings. 2. Reflux of contrast into the IVC and right hepatic vein, nonspecific, but can be seen in the setting of right heart failure. 3. Aortic Atherosclerosis (ICD10-I70.0) and Emphysema (ICD10-J43.9).     I will forward to Dr.Branch

## 2023-11-26 NOTE — Telephone Encounter (Signed)
 Daughter Marcelle) stated patient's pulmonologist diagnosed patient with a pulmonary embolism on patient's right side and was following upon getting patient a CT scan.

## 2023-11-29 ENCOUNTER — Other Ambulatory Visit: Payer: Self-pay

## 2023-11-29 ENCOUNTER — Emergency Department (HOSPITAL_COMMUNITY)

## 2023-11-29 ENCOUNTER — Ambulatory Visit: Payer: Self-pay

## 2023-11-29 ENCOUNTER — Observation Stay (HOSPITAL_COMMUNITY)
Admission: EM | Admit: 2023-11-29 | Discharge: 2023-11-30 | Disposition: A | Discharge status: Home / Self Care | Attending: Internal Medicine | Admitting: Internal Medicine

## 2023-11-29 ENCOUNTER — Encounter (HOSPITAL_COMMUNITY): Payer: Self-pay | Admitting: Emergency Medicine

## 2023-11-29 DIAGNOSIS — J449 Chronic obstructive pulmonary disease, unspecified: Secondary | ICD-10-CM | POA: Diagnosis present

## 2023-11-29 DIAGNOSIS — E8770 Fluid overload, unspecified: Principal | ICD-10-CM | POA: Diagnosis present

## 2023-11-29 DIAGNOSIS — R0902 Hypoxemia: Secondary | ICD-10-CM

## 2023-11-29 DIAGNOSIS — F32A Depression, unspecified: Secondary | ICD-10-CM | POA: Insufficient documentation

## 2023-11-29 DIAGNOSIS — R0609 Other forms of dyspnea: Secondary | ICD-10-CM | POA: Diagnosis present

## 2023-11-29 DIAGNOSIS — I10 Essential (primary) hypertension: Secondary | ICD-10-CM | POA: Diagnosis not present

## 2023-11-29 DIAGNOSIS — Z79899 Other long term (current) drug therapy: Secondary | ICD-10-CM | POA: Diagnosis not present

## 2023-11-29 DIAGNOSIS — I629 Nontraumatic intracranial hemorrhage, unspecified: Secondary | ICD-10-CM | POA: Insufficient documentation

## 2023-11-29 DIAGNOSIS — H5442A3 Blindness left eye category 3, normal vision right eye: Secondary | ICD-10-CM | POA: Diagnosis not present

## 2023-11-29 DIAGNOSIS — Z87891 Personal history of nicotine dependence: Secondary | ICD-10-CM | POA: Diagnosis not present

## 2023-11-29 DIAGNOSIS — I951 Orthostatic hypotension: Secondary | ICD-10-CM | POA: Insufficient documentation

## 2023-11-29 DIAGNOSIS — I509 Heart failure, unspecified: Secondary | ICD-10-CM | POA: Diagnosis not present

## 2023-11-29 DIAGNOSIS — I4891 Unspecified atrial fibrillation: Secondary | ICD-10-CM | POA: Insufficient documentation

## 2023-11-29 DIAGNOSIS — R109 Unspecified abdominal pain: Secondary | ICD-10-CM | POA: Diagnosis not present

## 2023-11-29 DIAGNOSIS — R0602 Shortness of breath: Secondary | ICD-10-CM | POA: Diagnosis not present

## 2023-11-29 DIAGNOSIS — I5033 Acute on chronic diastolic (congestive) heart failure: Secondary | ICD-10-CM | POA: Diagnosis not present

## 2023-11-29 DIAGNOSIS — J984 Other disorders of lung: Secondary | ICD-10-CM | POA: Diagnosis not present

## 2023-11-29 DIAGNOSIS — Z7982 Long term (current) use of aspirin: Secondary | ICD-10-CM | POA: Diagnosis not present

## 2023-11-29 DIAGNOSIS — I110 Hypertensive heart disease with heart failure: Secondary | ICD-10-CM | POA: Insufficient documentation

## 2023-11-29 DIAGNOSIS — J441 Chronic obstructive pulmonary disease with (acute) exacerbation: Secondary | ICD-10-CM | POA: Diagnosis not present

## 2023-11-29 DIAGNOSIS — H5440 Blindness, one eye, unspecified eye: Secondary | ICD-10-CM | POA: Diagnosis present

## 2023-11-29 DIAGNOSIS — I517 Cardiomegaly: Secondary | ICD-10-CM | POA: Diagnosis not present

## 2023-11-29 DIAGNOSIS — Z7901 Long term (current) use of anticoagulants: Secondary | ICD-10-CM | POA: Insufficient documentation

## 2023-11-29 LAB — CBC
HCT: 43 % (ref 39.0–52.0)
Hemoglobin: 14 g/dL (ref 13.0–17.0)
MCH: 30.4 pg (ref 26.0–34.0)
MCHC: 32.6 g/dL (ref 30.0–36.0)
MCV: 93.5 fL (ref 80.0–100.0)
Platelets: 231 K/uL (ref 150–400)
RBC: 4.6 MIL/uL (ref 4.22–5.81)
RDW: 14 % (ref 11.5–15.5)
WBC: 7.7 K/uL (ref 4.0–10.5)
nRBC: 0 % (ref 0.0–0.2)

## 2023-11-29 LAB — BASIC METABOLIC PANEL WITH GFR
Anion gap: 12 (ref 5–15)
BUN: 21 mg/dL (ref 8–23)
CO2: 24 mmol/L (ref 22–32)
Calcium: 9.1 mg/dL (ref 8.9–10.3)
Chloride: 103 mmol/L (ref 98–111)
Creatinine, Ser: 1.23 mg/dL (ref 0.61–1.24)
GFR, Estimated: >60 mL/min (ref >60)
Glucose, Bld: 145 mg/dL — ABNORMAL HIGH (ref 70–99)
Potassium: 4.2 mmol/L (ref 3.5–5.1)
Sodium: 139 mmol/L (ref 135–145)

## 2023-11-29 LAB — URINALYSIS, ROUTINE W REFLEX MICROSCOPIC
Bilirubin Urine: NEGATIVE
Glucose, UA: NEGATIVE mg/dL
Hgb urine dipstick: NEGATIVE
Ketones, ur: NEGATIVE mg/dL
Leukocytes,Ua: NEGATIVE
Nitrite: NEGATIVE
Protein, ur: NEGATIVE mg/dL
Specific Gravity, Urine: 1.032 — ABNORMAL HIGH (ref 1.005–1.030)
pH: 5 (ref 5.0–8.0)

## 2023-11-29 LAB — HEPATIC FUNCTION PANEL
ALT: 12 U/L (ref 0–44)
AST: 15 U/L (ref 15–41)
Albumin: 3.9 g/dL (ref 3.5–5.0)
Alkaline Phosphatase: 74 U/L (ref 38–126)
Bilirubin, Direct: 0.2 mg/dL (ref 0.0–0.2)
Indirect Bilirubin: 0.2 mg/dL — ABNORMAL LOW (ref 0.3–0.9)
Total Bilirubin: 0.4 mg/dL (ref 0.0–1.2)
Total Protein: 6.9 g/dL (ref 6.5–8.1)

## 2023-11-29 LAB — TROPONIN T, HIGH SENSITIVITY
Troponin T High Sensitivity: <15 ng/L (ref 0–19)
Troponin T High Sensitivity: <15 ng/L (ref 0–19)

## 2023-11-29 LAB — PRO BRAIN NATRIURETIC PEPTIDE: Pro Brain Natriuretic Peptide: 1420 pg/mL — ABNORMAL HIGH (ref <300.0)

## 2023-11-29 MED ORDER — DULOXETINE HCL 60 MG PO CPEP: 120.0000 mg | ORAL_CAPSULE | Freq: Every evening | ORAL | Status: DC

## 2023-11-29 MED ORDER — ACETAMINOPHEN 325 MG PO TABS
650.0000 mg | ORAL_TABLET | Freq: Four times a day (QID) | ORAL | Status: DC | PRN
Start: 2023-11-29 — End: 2023-11-30

## 2023-11-29 MED ORDER — IOHEXOL 300 MG/ML  SOLN
100.0000 mL | Freq: Once | INTRAMUSCULAR | Status: AC | PRN
  Administered 2023-11-29: 100 mL via INTRAVENOUS

## 2023-11-29 MED ORDER — FUROSEMIDE 10 MG/ML IJ SOLN: 40.0000 mg | Freq: Every day | INTRAMUSCULAR | Status: DC

## 2023-11-29 MED ORDER — ONDANSETRON HCL 4 MG/2ML IJ SOLN
4.0000 mg | Freq: Four times a day (QID) | INTRAMUSCULAR | Status: DC | PRN
Start: 2023-11-29 — End: 2023-11-30

## 2023-11-29 MED ORDER — LURASIDONE HCL 40 MG PO TABS: 40.0000 mg | ORAL_TABLET | Freq: Every day | ORAL | Status: DC

## 2023-11-29 MED ORDER — ACETAMINOPHEN 650 MG RE SUPP
650.0000 mg | Freq: Four times a day (QID) | RECTAL | Status: DC | PRN
Start: 2023-11-29 — End: 2023-11-30

## 2023-11-29 MED ORDER — IPRATROPIUM-ALBUTEROL 0.5-2.5 (3) MG/3ML IN SOLN: 3.0000 mL | RESPIRATORY_TRACT | Status: DC | PRN

## 2023-11-29 MED ORDER — BUPROPION HCL ER (XL) 300 MG PO TB24
450.0000 mg | ORAL_TABLET | ORAL | Status: DC
  Administered 2023-11-30: 450 mg via ORAL
  Filled 2023-11-29: qty 1

## 2023-11-29 MED ORDER — PANTOPRAZOLE SODIUM 40 MG PO TBEC
40.0000 mg | DELAYED_RELEASE_TABLET | Freq: Every day | ORAL | Status: DC
  Administered 2023-11-30: 40 mg via ORAL
  Filled 2023-11-29: qty 1

## 2023-11-29 MED ORDER — DONEPEZIL HCL 5 MG PO TABS
10.0000 mg | ORAL_TABLET | Freq: Every day | ORAL | Status: DC
  Administered 2023-11-29: 10 mg via ORAL
  Filled 2023-11-29: qty 2

## 2023-11-29 MED ORDER — ENOXAPARIN SODIUM 40 MG/0.4ML IJ SOSY
40.0000 mg | PREFILLED_SYRINGE | INTRAMUSCULAR | Status: DC
  Administered 2023-11-29: 40 mg via SUBCUTANEOUS
  Filled 2023-11-29: qty 0.4

## 2023-11-29 MED ORDER — METHYLPREDNISOLONE SODIUM SUCC 125 MG IJ SOLR
125.0000 mg | Freq: Once | INTRAMUSCULAR | Status: AC
  Administered 2023-11-29: 125 mg via INTRAVENOUS
  Filled 2023-11-29: qty 2

## 2023-11-29 MED ORDER — SODIUM CHLORIDE 0.9 % IV BOLUS
500.0000 mL | Freq: Once | INTRAVENOUS | Status: AC
  Administered 2023-11-29: 500 mL via INTRAVENOUS

## 2023-11-29 MED ORDER — FINASTERIDE 5 MG PO TABS
5.0000 mg | ORAL_TABLET | Freq: Every day | ORAL | Status: DC
  Administered 2023-11-30: 5 mg via ORAL
  Filled 2023-11-29: qty 1

## 2023-11-29 MED ORDER — IPRATROPIUM-ALBUTEROL 0.5-2.5 (3) MG/3ML IN SOLN
3.0000 mL | Freq: Once | RESPIRATORY_TRACT | Status: AC
  Administered 2023-11-29: 3 mL via RESPIRATORY_TRACT
  Filled 2023-11-29: qty 3

## 2023-11-29 MED ORDER — FUROSEMIDE 10 MG/ML IJ SOLN
40.0000 mg | Freq: Once | INTRAMUSCULAR | Status: AC
  Administered 2023-11-29: 40 mg via INTRAVENOUS
  Filled 2023-11-29: qty 4

## 2023-11-29 MED ORDER — FUROSEMIDE 10 MG/ML IJ SOLN
20.0000 mg | Freq: Two times a day (BID) | INTRAMUSCULAR | Status: DC
  Administered 2023-11-30: 20 mg via INTRAVENOUS
  Filled 2023-11-29: qty 2

## 2023-11-29 MED ORDER — POLYETHYLENE GLYCOL 3350 17 G PO PACK: 17.0000 g | PACK | Freq: Every day | ORAL | Status: DC | PRN

## 2023-11-29 MED ORDER — ONDANSETRON HCL 4 MG PO TABS: 4.0000 mg | ORAL_TABLET | Freq: Four times a day (QID) | ORAL | Status: DC | PRN

## 2023-11-29 MED ORDER — TRAMADOL HCL 50 MG PO TABS: 50.0000 mg | ORAL_TABLET | Freq: Four times a day (QID) | ORAL | Status: DC | PRN

## 2023-11-29 MED ORDER — BUDESON-GLYCOPYRROL-FORMOTEROL 160-9-4.8 MCG/ACT IN AERO
2.0000 | INHALATION_SPRAY | Freq: Two times a day (BID) | RESPIRATORY_TRACT | Status: DC
  Administered 2023-11-29 – 2023-11-30 (×2): 2 via RESPIRATORY_TRACT
  Filled 2023-11-29: qty 5.9

## 2023-11-29 NOTE — ED Notes (Signed)
 ED TO INPATIENT HANDOFF REPORT  ED Nurse Name and Phone #: [phone]  S Name/Age/Gender Jay [name] 73 y.o. male Room/Bed: APA07/APA07  Code Status   Code Status: Prior  Home/SNF/Other Home Patient oriented to: self, place, time, and situation Is this baseline? Yes   Triage Complete: Triage complete  Chief Complaint Dyspnea [R06.00]  Triage Note Pt presents with SOB and recent OP CT showing new right sided heart failure-advised by PCP to come to ED   Allergies No Known Allergies  Level of Care/Admitting Diagnosis ED Disposition     ED Disposition  Admit   Condition  --   Comment  Hospital Area: [facility] [[id number]]  Level of Care: Telemetry [5]  Diagnosis: Dyspnea [758128]  Admitting Physician: EMOKPAE, EJIROGHENE E [[id number]]  Attending Physician: EMOKPAE, EJIROGHENE E [email or web address]          B Medical/Surgery History Past Medical History:  Diagnosis Date   Anxiety    Arthritis    COPD (chronic obstructive pulmonary disease) (HCC)    Depression    Dysrhythmia    GERD (gastroesophageal reflux disease)    Hypertension    PAF (paroxysmal atrial fibrillation) (HCC)    Shortness of breath    with excertion   Past Surgical History:  Procedure Laterality Date   APPENDECTOMY     COLONOSCOPY WITH PROPOFOL  N/A 03/25/2022   Procedure: COLONOSCOPY WITH PROPOFOL ;  Surgeon: Shaaron Lamar HERO, MD;  [facility];  Service: Endoscopy;  Laterality: N/A;  9:00 am   EYE SURGERY     POLYPECTOMY  03/25/2022   Procedure: POLYPECTOMY INTESTINAL;  Surgeon: Shaaron Lamar HERO, MD;  Location: [facility];  Service: Endoscopy;;     A IV Location/Drains/Wounds Patient Lines/Drains/Airways Status     Active Line/Drains/Airways     Name Placement date Placement time Site Days   Peripheral IV 11/29/23 20 G Right;Posterior Wrist 11/29/23  1447  Wrist  less than 1            Intake/Output Last 24 hours  Intake/Output Summary (Last 24 hours) at 11/29/2023  1849 Last data filed at 11/29/2023 1828 Gross per 24 hour  Intake 500 ml  Output 350 ml  Net 150 ml    Labs/Imaging Results for orders placed or performed during the hospital encounter of 11/29/23 (from the past 48 hours)  Basic metabolic panel     Status: Abnormal   Collection Time: 11/29/23  1:46 PM  Result Value Ref Range   Sodium 139 135 - 145 mmol/L   Potassium 4.2 3.5 - 5.1 mmol/L   Chloride 103 98 - 111 mmol/L   CO2 24 22 - 32 mmol/L   Glucose, Bld 145 (H) 70 - 99 mg/dL    Comment: Glucose reference range applies only to samples taken after fasting for at least 8 hours.   BUN 21 8 - 23 mg/dL   Creatinine, Ser 8.76 0.61 - 1.24 mg/dL   Calcium  9.1 8.9 - 10.3 mg/dL   GFR, Estimated >39 >39 mL/min    Comment: (NOTE) Calculated using the CKD-EPI Creatinine Equation (2021)    Anion gap 12 5 - 15    Comment: Performed at [facility], [address] [place], [place] [address]  CBC     Status: None   Collection Time: 11/29/23  1:46 PM  Result Value Ref Range   WBC 7.7 4.0 - 10.5 K/uL   RBC 4.60 4.22 - 5.81 MIL/uL   Hemoglobin 14.0 13.0 - 17.0 g/dL  HCT 43.0 39.0 - 52.0 %   MCV 93.5 80.0 - 100.0 fL   MCH 30.4 26.0 - 34.0 pg   MCHC 32.6 30.0 - 36.0 g/dL   RDW 85.9 88.4 - 84.4 %   Platelets 231 150 - 400 K/uL   nRBC 0.0 0.0 - 0.2 %    Comment: Performed at [facility], [address] [place], [place] [address]  Troponin T, High Sensitivity     Status: None   Collection Time: 11/29/23  2:25 PM  Result Value Ref Range   Troponin T High Sensitivity <15 0 - 19 ng/L    Comment: (NOTE) Biotin concentrations > 1000 ng/mL falsely decrease TnT results.  Serial cardiac troponin measurements are suggested.  Refer to the Links section for chest pain algorithms and additional  guidance. Performed at [facility], [address], [place], [place] [address]   Pro Brain natriuretic peptide     Status: Abnormal   Collection Time: 11/29/23  2:25 PM  Result Value Ref Range   Pro  Brain Natriuretic Peptide 1,420.0 (H) <300.0 pg/mL    Comment: (NOTE) Age Group        Cut-Points    Interpretation  < 50 years     450 pg/mL       NT-proBNP > 450 pg/mL indicates                                ADHF is likely              50 to 75 years  900 pg/mL      NT-proBNP > 900 pg/mL indicates          ADHF is likely  > 75 years      1800 pg/mL     NT-proBNP > 1800 pg/mL indicates          ADHF is likely                           All ages    Results between       Indeterminate. Further clinical             300 and the cut-   information is needed to determine            point for age group   if ADHF is present.                                                             Elecsys proBNP II/ Elecsys proBNP II STAT           Cut-Point                       Interpretation  300 pg/mL                    NT-proBNP <300pg/mL indicates                             ADHF is not likely  Performed at [facility], [address] [place], [place] [address]   Hepatic function panel     Status:  Abnormal   Collection Time: 11/29/23  2:25 PM  Result Value Ref Range   Total Protein 6.9 6.5 - 8.1 g/dL   Albumin 3.9 3.5 - 5.0 g/dL   AST 15 15 - 41 U/L   ALT 12 0 - 44 U/L   Alkaline Phosphatase 74 38 - 126 U/L   Total Bilirubin 0.4 0.0 - 1.2 mg/dL   Bilirubin, Direct 0.2 0.0 - 0.2 mg/dL   Indirect Bilirubin 0.2 (L) 0.3 - 0.9 mg/dL    Comment: Performed at [facility], [address], [place], [place] [address]  Troponin T, High Sensitivity     Status: None   Collection Time: 11/29/23  4:45 PM  Result Value Ref Range   Troponin T High Sensitivity <15 0 - 19 ng/L    Comment: (NOTE) Biotin concentrations > 1000 ng/mL falsely decrease TnT results.  Serial cardiac troponin measurements are suggested.  Refer to the Links section for chest pain algorithms and additional  guidance. Performed at [facility], [address] [place], [place] [address]   Urinalysis, Routine w reflex  microscopic -Urine, Clean Catch     Status: Abnormal   Collection Time: 11/29/23  6:19 PM  Result Value Ref Range   Color, Urine YELLOW YELLOW   APPearance CLEAR CLEAR   Specific Gravity, Urine 1.032 (H) 1.005 - 1.030   pH 5.0 5.0 - 8.0   Glucose, UA NEGATIVE NEGATIVE mg/dL   Hgb urine dipstick NEGATIVE NEGATIVE   Bilirubin Urine NEGATIVE NEGATIVE   Ketones, ur NEGATIVE NEGATIVE mg/dL   Protein, ur NEGATIVE NEGATIVE mg/dL   Nitrite NEGATIVE NEGATIVE   Leukocytes,Ua NEGATIVE NEGATIVE    Comment: Performed at [facility], [address], [place], [place] [address]   CT ABDOMEN PELVIS W CONTRAST Result Date: 11/29/2023 EXAM: CT ABDOMEN AND PELVIS WITH CONTRAST 11/29/2023 04:17:46 PM TECHNIQUE: CT of the abdomen and pelvis was performed with the administration of 100 mL iohexol  (OMNIPAQUE ) 300 MG/ML solution. Multiplanar reformatted images are provided for review. Automated exposure control, iterative reconstruction, and/or weight-based adjustment of the mA/kV was utilized to reduce the radiation dose to as low as reasonably achievable. COMPARISON: None available. CLINICAL HISTORY: Abdominal pain, acute, nonlocalized. FINDINGS: LOWER CHEST: No acute abnormality. LIVER: The liver is unremarkable. GALLBLADDER AND BILE DUCTS: Normal gallbladder and biliary tract. SPLEEN: No acute abnormality. PANCREAS: No acute abnormality. ADRENAL GLANDS: No acute abnormality. KIDNEYS, URETERS AND BLADDER: No stones in the kidneys or ureters. No hydronephrosis. No perinephric or periureteral stranding. Urinary bladder is unremarkable. GI AND BOWEL: Stomach demonstrates no acute abnormality. There is no bowel obstruction. Appendix not identified but no secondary signs of appendicitis. PERITONEUM AND RETROPERITONEUM: No ascites. No free air. VASCULATURE: Vascular calcifications of the aorta. LYMPH NODES: No lymphadenopathy. REPRODUCTIVE ORGANS: No acute abnormality. BONES AND SOFT TISSUES: No acute osseous abnormality. No  focal soft tissue abnormality. IMPRESSION: 1. No acute findings in the abdomen or pelvis. Electronically signed by: Norleen Boxer MD 11/29/2023 05:09 PM EST RP Workstation: [id number]   DG Chest 2 View Result Date: 11/29/2023 EXAM: 2 VIEW(S) XRAY OF THE CHEST 11/29/2023 01:03:00 PM COMPARISON: 11/18/2023 and CT of 11/23/2023. CLINICAL HISTORY: CHF, SOB. FINDINGS: LUNGS AND PLEURA: Moderate hyperinflation. Bibasilar scarring. Chronic pulmonary interstitial thickening / coarsening. No pleural effusion. No pneumothorax. HEART AND MEDIASTINUM: Patient rotated right into the frontal. Mild cardiomegaly. BONES AND SOFT TISSUES: No acute osseous abnormality. IMPRESSION: 1. Hyperinflation and cardiomegaly with bibasilar scarring. 2. Electronically signed by: Rockey Kilts MD 11/29/2023 01:57 PM EST  RP Workstation: [id number]    Pending Labs Unresulted Labs (From admission, onward)    None       Vitals/Pain Today's Vitals   11/29/23 1500 11/29/23 1515 11/29/23 1530 11/29/23 1545  BP: 118/79 123/87 104/78 130/81  Pulse: 95 90 86 87  Resp: 19 15 18 15   Temp:      TempSrc:      SpO2: 91% (!) 88% 94% 93%  Weight:      Height:      PainSc:        Isolation Precautions No active isolations  Medications Medications  sodium chloride  0.9 % bolus 500 mL (0 mLs Intravenous Stopped 11/29/23 1628)  ipratropium-albuterol  (DUONEB) 0.5-2.5 (3) MG/3ML nebulizer solution 3 mL (3 mLs Nebulization Given 11/29/23 1440)  iohexol  (OMNIPAQUE ) 300 MG/ML solution 100 mL (100 mLs Intravenous Contrast Given 11/29/23 1553)  methylPREDNISolone sodium succinate (SOLU-MEDROL) 125 mg/2 mL injection 125 mg (125 mg Intravenous Given 11/29/23 1759)  furosemide  (LASIX ) injection 40 mg (40 mg Intravenous Given 11/29/23 1759)    Mobility walks with person assist     Focused Assessments    R Recommendations: See Admitting Provider Note  Report given to:   Additional Notes:

## 2023-11-29 NOTE — H&P (Signed)
 History and Physical    Jay [name] FMW:[id number] DOB: [birth year] DOA: 11/29/2023  PCP: [name], NP   Patient coming from: Home  I have personally briefly reviewed patient's old medical records in [facility]  Chief Complaint: Difficulty breathing  HPI: Jay [name] is a 73 y.o. male with medical history significant for COPD, hypertension, intracranial hemorrhage, atrial fibrillation. Patient presented to the ED with complaints of difficulty breathing with exertion, abdominal distention, intermittently with bilateral lower extremity swelling, dizziness when standing.  Daughter reports gradual decline over the past 2 years since patient had intracranial hemorrhage.  But over the past 6 months, patient has had 12 pound weight gain abdominal distention.  No chest pain.  No abdominal pain despite distention, abdomen just feels tight.  No urinary symptoms.  Minimal cough.  She has had outpatient workup done which included D-dimer which was positive, and subsequent CTA chest-11/11-negative for PE or other acute chest findings, but findings are consistent with right heart failure.  ED Course: Temperature 97.5.  Heart rate 80s to 99.  Respirate rate 15-19.  Blood pressure systolic 140 130.  O2 sats 91 to 95% on room air.  Initial faint wheezing noted in ED. Chest x-ray with hyperinflation and cardiomegaly with bibasilar scarring. CT abdomen and pelvis with contrast-no acute findings.  No ascites. 500 mL bolus given initially, subsequently Lasix  40 mg x 1 given, DuoNebs, Solu-Medrol 125 mg Given.  Review of Systems: As per HPI all other systems reviewed and negative.  Past Medical History:  Diagnosis Date   Anxiety    Arthritis    COPD (chronic obstructive pulmonary disease) (HCC)    Depression    Dysrhythmia    GERD (gastroesophageal reflux disease)    Hypertension    PAF (paroxysmal atrial fibrillation) (HCC)    Shortness of breath    with excertion    Past  Surgical History:  Procedure Laterality Date   APPENDECTOMY     COLONOSCOPY WITH PROPOFOL  N/A 03/25/2022   Procedure: COLONOSCOPY WITH PROPOFOL ;  Surgeon: Shaaron Lamar HERO, MD;  [facility];  Service: Endoscopy;  Laterality: N/A;  9:00 am   EYE SURGERY     POLYPECTOMY  03/25/2022   Procedure: POLYPECTOMY INTESTINAL;  Surgeon: Shaaron Lamar HERO, MD;  [facility]: [facility];  Service: Endoscopy;;     reports that he quit smoking about 30 years ago. His smoking use included cigarettes. He has never used smokeless tobacco. He reports that he does not drink alcohol and does not use drugs.  No Known Allergies  Family History  Problem Relation Age of Onset   Hypertension Mother    Cancer Father    Hypertension Father    Hypertension Sister    Cancer Brother    Hypertension Brother    Cancer - Colon Neg Hx    Colon polyps Neg Hx     Prior to Admission medications   Medication Sig Start Date End Date Taking? Authorizing Provider  acetaminophen  (TYLENOL ) 500 MG tablet Take 1,000 mg by mouth every 6 (six) hours as needed for moderate pain.   Yes [provider]  albuterol  (VENTOLIN  HFA) 108 (90 Base) MCG/ACT inhaler Inhale 2 puffs into the lungs every 4 (four) hours as needed for wheezing or shortness of breath. 05/28/23  Yes Nafziger, Huxley, NP  amLODipine  (NORVASC ) 5 MG tablet Take 1 tablet (5 mg total) by mouth every morning. 11/19/23  Yes Nafziger, Huxley, NP  aspirin  81 MG chewable tablet Chew  81 mg by mouth 2 (two) times daily.   Yes [provider]  atorvastatin  (LIPITOR) 40 MG tablet Take 1 tablet (40 mg total) by mouth daily. 11/19/23  Yes Nafziger, Darleene, NP  buPROPion  (WELLBUTRIN  XL) 150 MG 24 hr tablet Take 3 tablets (450 mg total) by mouth every morning. 08/24/23 08/23/24 Yes Carvin Arvella HERO, MD  donepezil  (ARICEPT ) 10 MG tablet Take 1 tablet (10 mg total) by mouth at bedtime. 11/19/23  Yes Nafziger, Darleene, NP  DULoxetine  (CYMBALTA ) 60 MG capsule Take 2 capsules  (120 mg total) by mouth every evening. 08/24/23  Yes Carvin Arvella HERO, MD  esomeprazole  (NEXIUM ) 40 MG capsule Take 1 capsule (40 mg total) by mouth every morning. 11/19/23  Yes Nafziger, Darleene, NP  famotidine  (PEPCID ) 20 MG tablet Take 1 tablet by mouth once daily 11/09/23  Yes Nafziger, Darleene, NP  finasteride  (PROSCAR ) 5 MG tablet Take 1 tablet by mouth once daily 10/12/23  Yes Nafziger, Darleene, NP  Fluticasone-Umeclidin-Vilant (TRELEGY ELLIPTA ) 100-62.5-25 MCG/ACT AEPB INHALE 1 PUFF INTO LUNGS ONCE DAILY 05/24/23  Yes Hope Almarie ORN, NP  lurasidone  (LATUDA ) 40 MG TABS tablet Take 1 tablet (40 mg total) by mouth at bedtime. 08/24/23  Yes Carvin Arvella HERO, MD  metoprolol  tartrate (LOPRESSOR ) 50 MG tablet Take 1 tablet (50 mg total) by mouth 2 (two) times daily. 11/19/23  Yes Nafziger, Darleene, NP  tamsulosin  (FLOMAX ) 0.4 MG CAPS capsule Take 1 capsule (0.4 mg total) by mouth daily. 11/19/23  Yes Nafziger, Darleene, NP  traMADol  (ULTRAM ) 50 MG tablet Take 1 tablet (50 mg total) by mouth every 6 (six) hours as needed for severe pain (pain score 7-10). 11/19/23  Yes Nafziger, Darleene, NP  memantine  (NAMENDA ) 10 MG tablet Take 1 tablet (10 mg total) by mouth 2 (two) times daily. Patient not taking: Reported on 11/29/2023 02/16/23 11/19/23  Merna Darleene, NP    Physical Exam: Vitals:   11/29/23 1500 11/29/23 1515 11/29/23 1530 11/29/23 1545  BP: 118/79 123/87 104/78 130/81  Pulse: 95 90 86 87  Resp: 19 15 18 15   Temp:      TempSrc:      SpO2: 91% (!) 88% 94% 93%  Weight:      Height:        Constitutional: NAD, calm, comfortable Vitals:   11/29/23 1500 11/29/23 1515 11/29/23 1530 11/29/23 1545  BP: 118/79 123/87 104/78 130/81  Pulse: 95 90 86 87  Resp: 19 15 18 15   Temp:      TempSrc:      SpO2: 91% (!) 88% 94% 93%  Weight:      Height:       Eyes: Ptosis and conjunctiva injection so left -normal for patient, 2/2 prior eye surgery,. ENMT: Mucous membranes are moist.  Neck: normal, supple, no masses, no  thyromegaly Respiratory: clear to auscultation bilaterally, no wheezing, no crackles. Normal respiratory effort. No accessory muscle use.  Cardiovascular: Regular rate and rhythm, no murmurs / rubs / gallops. No extremity edema.  Extremities warm. Abdomen: Distended, tight and firm to palpation, but not hard, no tenderness, no masses palpated.  Musculoskeletal: no clubbing / cyanosis. No joint deformity upper and lower extremities.  Skin: no rashes, lesions, ulcers. No induration Neurologic: Moving extremities spontaneously, speech fluent.SABRA  Psychiatric: Normal judgment and insight. Alert and oriented x 3. Normal mood.   Labs on Admission: I have personally reviewed following labs and imaging studies  CBC: Recent Labs  Lab 11/29/23 1346  WBC 7.7  HGB 14.0  HCT 43.0  MCV 93.5  PLT 231   Basic Metabolic Panel: Recent Labs  Lab 11/29/23 1346  NA 139  K 4.2  CL 103  CO2 24  GLUCOSE 145*  BUN 21  CREATININE 1.23  CALCIUM  9.1   GFR: Estimated Creatinine Clearance: 60.5 mL/min (by C-G formula based on SCr of 1.23 mg/dL). Liver Function Tests: Recent Labs  Lab 11/29/23 1425  AST 15  ALT 12  ALKPHOS 74  BILITOT 0.4  PROT 6.9  ALBUMIN 3.9   BNP (last 3 results) Recent Labs    11/29/23 1425  PROBNP 1,420.0*   Urine analysis:    Component Value Date/Time   COLORURINE YELLOW 11/29/2023 1819   APPEARANCEUR CLEAR 11/29/2023 1819   LABSPEC 1.032 (H) 11/29/2023 1819   PHURINE 5.0 11/29/2023 1819   GLUCOSEU NEGATIVE 11/29/2023 1819   GLUCOSEU NEGATIVE 07/08/2022 1403   HGBUR NEGATIVE 11/29/2023 1819   BILIRUBINUR NEGATIVE 11/29/2023 1819   KETONESUR NEGATIVE 11/29/2023 1819   PROTEINUR NEGATIVE 11/29/2023 1819   UROBILINOGEN 2.0 (A) 07/08/2022 1403   NITRITE NEGATIVE 11/29/2023 1819   LEUKOCYTESUR NEGATIVE 11/29/2023 1819    Radiological Exams on Admission: CT ABDOMEN PELVIS W CONTRAST Result Date: 11/29/2023 EXAM: CT ABDOMEN AND PELVIS WITH CONTRAST 11/29/2023  04:17:46 PM TECHNIQUE: CT of the abdomen and pelvis was performed with the administration of 100 mL iohexol  (OMNIPAQUE ) 300 MG/ML solution. Multiplanar reformatted images are provided for review. Automated exposure control, iterative reconstruction, and/or weight-based adjustment of the mA/kV was utilized to reduce the radiation dose to as low as reasonably achievable. COMPARISON: None available. CLINICAL HISTORY: Abdominal pain, acute, nonlocalized. FINDINGS: LOWER CHEST: No acute abnormality. LIVER: The liver is unremarkable. GALLBLADDER AND BILE DUCTS: Normal gallbladder and biliary tract. SPLEEN: No acute abnormality. PANCREAS: No acute abnormality. ADRENAL GLANDS: No acute abnormality. KIDNEYS, URETERS AND BLADDER: No stones in the kidneys or ureters. No hydronephrosis. No perinephric or periureteral stranding. Urinary bladder is unremarkable. GI AND BOWEL: Stomach demonstrates no acute abnormality. There is no bowel obstruction. Appendix not identified but no secondary signs of appendicitis. PERITONEUM AND RETROPERITONEUM: No ascites. No free air. VASCULATURE: Vascular calcifications of the aorta. LYMPH NODES: No lymphadenopathy. REPRODUCTIVE ORGANS: No acute abnormality. BONES AND SOFT TISSUES: No acute osseous abnormality. No focal soft tissue abnormality. IMPRESSION: 1. No acute findings in the abdomen or pelvis. Electronically signed by: Norleen Boxer MD 11/29/2023 05:09 PM EST RP Workstation: [id number]   DG Chest 2 View Result Date: 11/29/2023 EXAM: 2 VIEW(S) XRAY OF THE CHEST 11/29/2023 01:03:00 PM COMPARISON: 11/18/2023 and CT of 11/23/2023. CLINICAL HISTORY: CHF, SOB. FINDINGS: LUNGS AND PLEURA: Moderate hyperinflation. Bibasilar scarring. Chronic pulmonary interstitial thickening / coarsening. No pleural effusion. No pneumothorax. HEART AND MEDIASTINUM: Patient rotated right into the frontal. Mild cardiomegaly. BONES AND SOFT TISSUES: No acute osseous abnormality. IMPRESSION: 1. Hyperinflation and  cardiomegaly with bibasilar scarring. 2. Electronically signed by: Rockey Kilts MD 11/29/2023 01:57 PM EST RP Workstation: [id number]    EKG: Independently reviewed.  Atrial fibrillation, rate 95, QTc 412.  No significant change from prior.  Assessment/Plan Principal Problem:   Volume overload Active Problems:   Intracranial hemorrhage (HCC)   Blind left eye   HTN (hypertension)   COPD (chronic obstructive pulmonary disease) (HCC)   DOE (dyspnea on exertion)  Assessment and Plan:  Volume overload-reported 12 pound weight gain over the past 6 months, abdominal distention, intermittent bilateral lower extremity edema.  BNP elevated at 1420.  Chest x-ray without acute abnormality.  Last echo 2023  EF of 55 to 60%.  CTA chest 11/11 negative for PE, suggest right heart failure.  Troponin < 15 x 2 - Obtain echocardiogram - IV Lasix  2 BID for now with soft blood pressure and orthostatic dizziness - Strict input output, Daily weights, daily BMP  Orthostatic dizziness-blood pressure systolic down to 898. - Check orthostatic vitals - Hold antihypertensives for now  COPD-stable.  Initial faint wheezing on arrival to the ED has resolved.  -  IV Solu-Medrol 125 mg given, holding further steroids for now - Nebs as needed  Hypertension-blood pressure soft currently 101 systolic. - Hold Norvasc  5 mg, metoprolol  50 twice daily  Atrial fibrillation-rate controlled.  Not on anticoagulation.  EKG shows atrial fibrillation,.  Follows with Dr. Alysia PCP notes 11/7 and cardiology 10/2022 patient is supposed to be on anticoagulation. 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  Imaging showed minimal subarachnoid hemorrhage with very small frontal contusions of no significance.  Depression on multiple medications - Resume bupropion , Cymbalta , lurasidone ,  donepezil ,   DVT prophylaxis: Lovenox Code Status: Full code Family Communication: Spouse and daughter-Wendy at bedside Disposition Plan: 2 days Consults called: None Admission status:  Obs tele   Author: Tully FORBES Carwin, MD 11/29/2023 9:02 PM  For on call review [email or web address].

## 2023-11-29 NOTE — Telephone Encounter (Signed)
 FYI Only or Action Required?: Action required by provider: update on patient condition.  Patient was last seen in primary care on 11/19/2023 by [name], NP.  Called Nurse Triage reporting Edema.  Symptoms began several years ago.  Symptoms are: gradually worsening.  Triage Disposition: Go to ED Now (or PCP Triage)  Patient/caregiver understands and will follow disposition?: Yes            Copied from CRM [id number]. Topic: Clinical - Red Word Triage >> Nov 29, 2023 11:03 AM Antwanette L wrote: Red Word that prompted transfer to Nurse Triage: Jay [name], the patient's daughter, called regarding recent medical concerns.Patient was seen by the lung specialist and had a CT scan on 11/11. Results indicated right-sided heart failure.The lung provider advised that the patient follow up with his cardiologist. Jay [name] stated she has contacted the cardiologist's office but has not received a return call. She is requesting that the heart doctor be contacted directly for follow-up.Patient is currently experiencing swelling on the right side Reason for Disposition  MODERATE difficulty breathing (e.g., speaks in phrases, SOB even at rest, pulse 100-120)  Answer Assessment - Initial Assessment Questions This RN spoke with pt's daughter, Jay [name]. This RN recommends ED and pt daughter taking him there.  Pt had a CT scan with pulm on 11/11 which indicated right-sided heart failure. Pt is trying to get in with cardiologist but has not received a return call (Dr. Alvan). Pt daughter is wanting PCP to contact cardiologist so he can get in.  Right-sided swelling intermittent in stomach 3-4 years Bulge that feels hard but also a little squish to it on right side close to ribs; has been there Dark grey with darker circle spots that itch also on right side Pt gets winded when getting up; worsening over 2 years Confusion but pt has dementia per pt daughter; 3-4 years Denies chest pain  198 lb 6  months ago, 210 lb now   O2 SATURATION MONITOR: Do you use an oxygen saturation monitor (pulse oximeter) at home? If Yes, ask: What is your reading (oxygen level) today? What is your usual oxygen saturation reading? (e.g., 95%)  Doesn't have pulse ox; on room air, no supplement O2  Protocols used: Heart Failure on Treatment Follow-up Call-[facility]

## 2023-11-29 NOTE — ED Provider Notes (Signed)
 [place] EMERGENCY DEPARTMENT AT [facility] Provider Note   CSN: [id number] Arrival date & time: 11/29/23  1231     Patient presents with: Shortness of Breath   Jay [name] is a 73 y.o. male.   Pt is a 73 yo male with pmhx significant for anxiety, depression, copd, htn, gerd, afib (on Eliquis ), and chronic pain.  Pt's daughter gives most of the hx.  Pt has been sob with exertion.  He had a ddimer which was +, so he had a CTA of his chest which did not show a PE, but did show a reflux of contrast into the IVC and right hepatic vein which can be seen with right heart failure.  His daughter was told that he needed to see cards.  She said she's been trying to call for an appointment, but can't get ahold of anyone.  Pt feels ok when he's sitting still, but does admit to SOB with any exertion.  Daughter feels like he's had a swollen stomach.  She also said he's gained several pounds in the last few months.         Prior to Admission medications   Medication Sig Start Date End Date Taking? Authorizing Provider  acetaminophen  (TYLENOL ) 500 MG tablet Take 1,000 mg by mouth every 6 (six) hours as needed for moderate pain.   Yes [provider]  albuterol  (VENTOLIN  HFA) 108 (90 Base) MCG/ACT inhaler Inhale 2 puffs into the lungs every 4 (four) hours as needed for wheezing or shortness of breath. 05/28/23  Yes Nafziger, Darleene, NP  amLODipine  (NORVASC ) 5 MG tablet Take 1 tablet (5 mg total) by mouth every morning. 11/19/23  Yes Nafziger, Darleene, NP  aspirin  81 MG chewable tablet Chew 81 mg by mouth 2 (two) times daily.   Yes [provider]  atorvastatin  (LIPITOR) 40 MG tablet Take 1 tablet (40 mg total) by mouth daily. 11/19/23  Yes Nafziger, Darleene, NP  buPROPion  (WELLBUTRIN  XL) 150 MG 24 hr tablet Take 3 tablets (450 mg total) by mouth every morning. 08/24/23 08/23/24 Yes Carvin Arvella HERO, MD  donepezil  (ARICEPT ) 10 MG tablet Take 1 tablet (10 mg total) by mouth at  bedtime. 11/19/23  Yes Nafziger, Darleene, NP  DULoxetine  (CYMBALTA ) 60 MG capsule Take 2 capsules (120 mg total) by mouth every evening. 08/24/23  Yes Carvin Arvella HERO, MD  esomeprazole  (NEXIUM ) 40 MG capsule Take 1 capsule (40 mg total) by mouth every morning. 11/19/23  Yes Nafziger, Darleene, NP  famotidine  (PEPCID ) 20 MG tablet Take 1 tablet by mouth once daily 11/09/23  Yes Nafziger, Darleene, NP  finasteride  (PROSCAR ) 5 MG tablet Take 1 tablet by mouth once daily 10/12/23  Yes Nafziger, Darleene, NP  Fluticasone-Umeclidin-Vilant (TRELEGY ELLIPTA ) 100-62.5-25 MCG/ACT AEPB INHALE 1 PUFF INTO LUNGS ONCE DAILY 05/24/23  Yes Hope Almarie LELON, NP  lurasidone  (LATUDA ) 40 MG TABS tablet Take 1 tablet (40 mg total) by mouth at bedtime. 08/24/23  Yes Carvin Arvella HERO, MD  metoprolol  tartrate (LOPRESSOR ) 50 MG tablet Take 1 tablet (50 mg total) by mouth 2 (two) times daily. 11/19/23  Yes Nafziger, Darleene, NP  tamsulosin  (FLOMAX ) 0.4 MG CAPS capsule Take 1 capsule (0.4 mg total) by mouth daily. 11/19/23  Yes Nafziger, Darleene, NP  traMADol  (ULTRAM ) 50 MG tablet Take 1 tablet (50 mg total) by mouth every 6 (six) hours as needed for severe pain (pain score 7-10). 11/19/23  Yes Nafziger, Darleene, NP  memantine  (NAMENDA ) 10 MG tablet Take 1 tablet (  10 mg total) by mouth 2 (two) times daily. Patient not taking: Reported on 11/29/2023 02/16/23 11/19/23  Nafziger, Cory, NP    Allergies: Patient has no known allergies.    Review of Systems  Respiratory:  Positive for shortness of breath.   All other systems reviewed and are negative.   Updated Vital Signs BP 130/81   Pulse 87   Temp (!) 97.5 F (36.4 C) (Temporal)   Resp 15   Ht 5' 8.5 (1.74 m)   Wt 95.7 kg   SpO2 93%   BMI 31.62 kg/m   Physical Exam Vitals and nursing note reviewed.  Constitutional:      Appearance: He is well-developed. He is obese.  HENT:     Head: Normocephalic and atraumatic.     Mouth/Throat:     Mouth: Mucous membranes are moist.     Pharynx:  Oropharynx is clear.  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 Rhythm irregular.  Pulmonary:     Effort: Pulmonary effort is normal.     Breath sounds: Normal breath sounds.  Abdominal:     General: Bowel sounds are normal.     Palpations: Abdomen is soft.  Musculoskeletal:        General: Normal range of motion.     Cervical back: Normal range of motion and neck supple.  Skin:    General: Skin is warm.     Capillary Refill: Capillary refill takes less than 2 seconds.  Neurological:     General: No focal deficit present.     Mental Status: He is alert and oriented to person, place, and time.  Psychiatric:        Mood and Affect: Mood normal.        Behavior: Behavior normal.     (all labs ordered are listed, but only abnormal results are displayed) Labs Reviewed  BASIC METABOLIC PANEL WITH GFR - Abnormal; Notable for the following components:      Result Value   Glucose, Bld 145 (*)    All other components within normal limits  PRO BRAIN NATRIURETIC PEPTIDE - Abnormal; Notable for the following components:   Pro Brain Natriuretic Peptide 1,420.0 (*)    All other components within normal limits  HEPATIC FUNCTION PANEL - Abnormal; Notable for the following components:   Indirect Bilirubin 0.2 (*)    All other components within normal limits  CBC  URINALYSIS, ROUTINE W REFLEX MICROSCOPIC  TROPONIN T, HIGH SENSITIVITY  TROPONIN T, HIGH SENSITIVITY    EKG: EKG Interpretation Date/Time:  Monday November 29 2023 12:55:21 EST Ventricular Rate:  95 PR Interval:    QRS Duration:  90 QT Interval:  328 QTC Calculation: 412 R Axis:   51  Text Interpretation: Atrial fibrillation ST & T wave abnormality, consider inferior ischemia ST & T wave abnormality, consider anterolateral ischemia Abnormal ECG When compared with ECG of 27-Nov-2022 15:22, Inverted T waves have replaced nonspecific T wave  abnormality in Inferior leads T wave inversion now evident in Anterior leads No significant change since last tracing Confirmed by Dean Clarity [phone]) on 11/29/2023 2:54:16 PM  Radiology: CT ABDOMEN PELVIS W CONTRAST Result Date: 11/29/2023 EXAM: CT ABDOMEN AND PELVIS WITH CONTRAST 11/29/2023 04:17:46 PM TECHNIQUE: CT of the abdomen and pelvis was performed with the administration of 100 mL iohexol  (OMNIPAQUE ) 300 MG/ML solution. Multiplanar reformatted images are provided for  review. Automated exposure control, iterative reconstruction, and/or weight-based adjustment of the mA/kV was utilized to reduce the radiation dose to as low as reasonably achievable. COMPARISON: None available. CLINICAL HISTORY: Abdominal pain, acute, nonlocalized. FINDINGS: LOWER CHEST: No acute abnormality. LIVER: The liver is unremarkable. GALLBLADDER AND BILE DUCTS: Normal gallbladder and biliary tract. SPLEEN: No acute abnormality. PANCREAS: No acute abnormality. ADRENAL GLANDS: No acute abnormality. KIDNEYS, URETERS AND BLADDER: No stones in the kidneys or ureters. No hydronephrosis. No perinephric or periureteral stranding. Urinary bladder is unremarkable. GI AND BOWEL: Stomach demonstrates no acute abnormality. There is no bowel obstruction. Appendix not identified but no secondary signs of appendicitis. PERITONEUM AND RETROPERITONEUM: No ascites. No free air. VASCULATURE: Vascular calcifications of the aorta. LYMPH NODES: No lymphadenopathy. REPRODUCTIVE ORGANS: No acute abnormality. BONES AND SOFT TISSUES: No acute osseous abnormality. No focal soft tissue abnormality. IMPRESSION: 1. No acute findings in the abdomen or pelvis. Electronically signed by: Norleen Boxer MD 11/29/2023 05:09 PM EST RP Workstation: [id number]   DG Chest 2 View Result Date: 11/29/2023 EXAM: 2 VIEW(S) XRAY OF THE CHEST 11/29/2023 01:03:00 PM COMPARISON: 11/18/2023 and CT of 11/23/2023. CLINICAL HISTORY: CHF, SOB. FINDINGS: LUNGS AND PLEURA:  Moderate hyperinflation. Bibasilar scarring. Chronic pulmonary interstitial thickening / coarsening. No pleural effusion. No pneumothorax. HEART AND MEDIASTINUM: Patient rotated right into the frontal. Mild cardiomegaly. BONES AND SOFT TISSUES: No acute osseous abnormality. IMPRESSION: 1. Hyperinflation and cardiomegaly with bibasilar scarring. 2. Electronically signed by: Rockey Kilts MD 11/29/2023 01:57 PM EST RP Workstation: [id number]     Procedures   Medications Ordered in the ED  sodium chloride  0.9 % bolus 500 mL (0 mLs Intravenous Stopped 11/29/23 1628)  ipratropium-albuterol  (DUONEB) 0.5-2.5 (3) MG/3ML nebulizer solution 3 mL (3 mLs Nebulization Given 11/29/23 1440)  iohexol  (OMNIPAQUE ) 300 MG/ML solution 100 mL (100 mLs Intravenous Contrast Given 11/29/23 1553)  methylPREDNISolone sodium succinate (SOLU-MEDROL) 125 mg/2 mL injection 125 mg (125 mg Intravenous Given 11/29/23 1759)  furosemide  (LASIX ) injection 40 mg (40 mg Intravenous Given 11/29/23 1759)                                    Medical Decision Making Amount and/or Complexity of Data Reviewed Labs: ordered. Radiology: ordered.  Risk Prescription drug management. Decision regarding hospitalization.   This patient presents to the ED for concern of sob, this involves an extensive number of treatment options, and is a complaint that carries with it a high risk of complications and morbidity.  The differential diagnosis includes copd, chf, anemia, pna   Co morbidities that complicate the patient evaluation   anxiety, depression, copd, htn, gerd, afib (on Eliquis ), and chronic pain   Additional history obtained:  Additional history obtained from epic chart review External records from outside source obtained and reviewed including daughter   Lab Tests:  I Ordered, and personally interpreted labs.  The pertinent results include:  cbc nl, bmp nl, lfts nl, BNP elevated at 1420; trop nl   Imaging Studies  ordered:  I ordered imaging studies including cxr and ct abd/pelvis I independently visualized and interpreted imaging which showed  CXR: Hyperinflation and cardiomegaly with bibasilar scarring.  CT abd/pelvis: No acute findings in the abdomen or pelvis.  I agree with the radiologist interpretation   Cardiac Monitoring:  The patient was maintained on a cardiac monitor.  I personally viewed and interpreted the cardiac monitored which showed an underlying rhythm of: afib  Medicines ordered and prescription drug management:  I ordered medication including ivfs  for sx  Reevaluation of the patient after these medicines showed that the patient improved I have reviewed the patients home medicines and have made adjustments as needed   Test Considered:  ct   Critical Interventions:  Lasix /nebs   Consultations Obtained:  I requested consultation with the hospitalist (Dr. Pearlean),  and discussed lab and imaging findings as well as pertinent plan - she will admit   Problem List / ED Course:  Dyspnea:  likely multifactorial.  He likely has chf and copd exac.  He is unable to get oob without getting sob.  O2 sat drops to 88% just getting oob.  He is given lasix /nebs.  He will need admission.   Reevaluation:  After the interventions noted above, I reevaluated the patient and found that they have :improved   Social Determinants of Health:  Lives at home   Dispostion:  After consideration of the diagnostic results and the patients response to treatment, I feel that the patent would benefit from admission.       Final diagnoses:  COPD exacerbation (HCC)  Acute on chronic congestive heart failure, unspecified heart failure type [place])  Hypoxia    ED Discharge Orders     None          Dean Clarity, MD 11/29/23 [phone]

## 2023-11-29 NOTE — ED Notes (Signed)
 Pt daughter left to pick up pt's wife.

## 2023-11-29 NOTE — ED Notes (Signed)
 Pt only able to walk in place for about 30 seconds. Pt became dizzy, sob, and tachycardic. Pt was unable to maintain a steady rhythm walking in place. Pt, pt daughter, nor pt feel safe ambulating pt out of the room

## 2023-11-29 NOTE — ED Triage Notes (Signed)
 Pt presents with SOB and recent OP CT showing new right sided heart failure-advised by PCP to come to ED

## 2023-11-29 NOTE — Plan of Care (Signed)

## 2023-11-30 ENCOUNTER — Observation Stay (HOSPITAL_COMMUNITY)

## 2023-11-30 ENCOUNTER — Other Ambulatory Visit (HOSPITAL_COMMUNITY): Payer: Self-pay

## 2023-11-30 ENCOUNTER — Telehealth (HOSPITAL_COMMUNITY): Payer: Self-pay | Admitting: Pharmacy Technician

## 2023-11-30 DIAGNOSIS — J449 Chronic obstructive pulmonary disease, unspecified: Secondary | ICD-10-CM | POA: Diagnosis not present

## 2023-11-30 DIAGNOSIS — I5031 Acute diastolic (congestive) heart failure: Secondary | ICD-10-CM | POA: Diagnosis not present

## 2023-11-30 DIAGNOSIS — E8770 Fluid overload, unspecified: Secondary | ICD-10-CM | POA: Diagnosis not present

## 2023-11-30 LAB — ECHOCARDIOGRAM COMPLETE
AV Mean grad: 3.1 mmHg
AV Peak grad: 5.8 mmHg
Ao pk vel: 1.21 m/s
Height: 68.5 in
Weight: 3241.64 [oz_av]

## 2023-11-30 LAB — BASIC METABOLIC PANEL WITH GFR
Anion gap: 14 (ref 5–15)
BUN: 23 mg/dL (ref 8–23)
CO2: 25 mmol/L (ref 22–32)
Calcium: 9.4 mg/dL (ref 8.9–10.3)
Chloride: 100 mmol/L (ref 98–111)
Creatinine, Ser: 1.31 mg/dL — ABNORMAL HIGH (ref 0.61–1.24)
GFR, Estimated: 57 mL/min — ABNORMAL LOW (ref >60)
Glucose, Bld: 148 mg/dL — ABNORMAL HIGH (ref 70–99)
Potassium: 4.3 mmol/L (ref 3.5–5.1)
Sodium: 139 mmol/L (ref 135–145)

## 2023-11-30 MED ORDER — FUROSEMIDE 40 MG PO TABS: 40.0000 mg | ORAL_TABLET | Freq: Every day | ORAL | 11 refills | Status: DC

## 2023-11-30 NOTE — Plan of Care (Signed)
   Problem: Education: Goal: Knowledge of General Education information will improve Description Including pain rating scale, medication(s)/side effects and non-pharmacologic comfort measures Outcome: Progressing

## 2023-11-30 NOTE — Progress Notes (Signed)
 Transition of Care Department [facility]) has reviewed patient and no other TOC needs have been identified at this time. We will continue to monitor patient advancement through interdisciplinary progression rounds. If new patient transition needs arise, please place a TOC consult.   11/30/23 0804  TOC Brief Assessment  Insurance and Status Reviewed  Patient has primary care physician Yes  Home environment has been reviewed Lives with wife.  Prior level of function: Wife assists.  Prior/Current Home Services No current home services  Social Drivers of Health Review SDOH reviewed no interventions necessary  Readmission risk has been reviewed Yes  Transition of care needs no transition of care needs at this time

## 2023-11-30 NOTE — Discharge Summary (Signed)
 Physician Discharge Summary   [name] MRN: [id number] DOB: April 04, 1950  Admit date:     11/29/2023  Discharge date: 11/30/23  Discharge Physician: Carliss LELON Canales   PCP: [name], NP   Recommendations at discharge:    Pt to be discharged home.   If you experience worsening fever, chills, chest pain, shortness of breath, or other concerning symptoms, please call your PCP or go to the emergency department immediately.  Discharge Diagnoses: Principal Problem:   Volume overload Active Problems:   Intracranial hemorrhage (HCC)   Blind left eye   HTN (hypertension)   COPD (chronic obstructive pulmonary disease) (HCC)   DOE (dyspnea on exertion)  Resolved Problems:   * No resolved hospital problems. *   Hospital Course:  73 y.o. male with medical history significant for COPD, hypertension, intracranial hemorrhage, atrial fibrillation. Patient presented to the ED with complaints of difficulty breathing with exertion, abdominal distention, intermittently with bilateral lower extremity swelling, dizziness when standing.  Daughter reports gradual decline over the past 2 years since patient had intracranial hemorrhage.  But over the past 6 months, patient has had 12 pound weight gain abdominal distention.  No chest pain.  No abdominal pain despite distention, abdomen just feels tight.  No urinary symptoms.  Minimal cough.   She has had outpatient workup done which included D-dimer which was positive, and subsequent CTA chest-11/11-negative for PE or other acute chest findings, but findings are consistent with right heart failure.   ED Course: Temperature 97.5.  Heart rate 80s to 99.  Respirate rate 15-19.  Blood pressure systolic 140 130.  O2 sats 91 to 95% on room air.  Initial faint wheezing noted in ED. Chest x-ray with hyperinflation and cardiomegaly with bibasilar scarring. CT abdomen and pelvis with contrast-no acute findings.  No ascites. 500 mL bolus given  initially, subsequently Lasix  40 mg x 1 given, DuoNebs, Solu-Medrol 125 mg Given.  Assessment and Plan:  Acute exacerbation of HFpEF - Echo noting EF 60-65%.  Weight gain, shortness of breath, and mild edema on presentation.  Responded quite well to IV diuresis.  Shortness of breath resolved.  Patient ambulatory, eager for discharge home.  Will transition patient to p.o. Lasix  to take daily upon discharge.  Resume Antivert regiment.  Atrial fibrillation - Currently rate controlled.  Follows with Dr. Alvan cardiology.  Resume metoprolol .  COPD - Does not appear to be in acute exacerbation.  Did receive IV Solu-Medrol x 1 with resolution of any mild wheezing.  Continue home regimen.   Consultants: None Procedures performed: None Disposition: Home Diet recommendation:  Discharge Diet Orders (From admission, onward)     Start     Ordered   11/30/23 0000  Diet - low sodium heart healthy        11/30/23 1453           Cardiac diet  DISCHARGE MEDICATION: Allergies as of 11/30/2023   No Known Allergies      Medication List     TAKE these medications    acetaminophen  500 MG tablet Commonly known as: TYLENOL  Take 1,000 mg by mouth every 6 (six) hours as needed for moderate pain.   albuterol  108 (90 Base) MCG/ACT inhaler Commonly known as: VENTOLIN  HFA Inhale 2 puffs into the lungs every 4 (four) hours as needed for wheezing or shortness of breath.   amLODipine  5 MG tablet Commonly known as: NORVASC  Take 1 tablet (5 mg total) by mouth every morning.   aspirin   81 MG chewable tablet Chew 81 mg by mouth 2 (two) times daily.   atorvastatin  40 MG tablet Commonly known as: LIPITOR Take 1 tablet (40 mg total) by mouth daily.   buPROPion  150 MG 24 hr tablet Commonly known as: Wellbutrin  XL Take 3 tablets (450 mg total) by mouth every morning.   donepezil  10 MG tablet Commonly known as: ARICEPT  Take 1 tablet (10 mg total) by mouth at bedtime.   DULoxetine  60 MG  capsule Commonly known as: CYMBALTA  Take 2 capsules (120 mg total) by mouth every evening.   esomeprazole  40 MG capsule Commonly known as: NEXIUM  Take 1 capsule (40 mg total) by mouth every morning.   famotidine  20 MG tablet Commonly known as: PEPCID  Take 1 tablet by mouth once daily   finasteride  5 MG tablet Commonly known as: PROSCAR  Take 1 tablet by mouth once daily   furosemide  40 MG tablet Commonly known as: Lasix  Take 1 tablet (40 mg total) by mouth daily.   lurasidone  40 MG Tabs tablet Commonly known as: LATUDA  Take 1 tablet (40 mg total) by mouth at bedtime.   memantine  10 MG tablet Commonly known as: NAMENDA  Take 1 tablet (10 mg total) by mouth 2 (two) times daily.   metoprolol  tartrate 50 MG tablet Commonly known as: LOPRESSOR  Take 1 tablet (50 mg total) by mouth 2 (two) times daily.   tamsulosin  0.4 MG Caps capsule Commonly known as: FLOMAX  Take 1 capsule (0.4 mg total) by mouth daily.   traMADol  50 MG tablet Commonly known as: ULTRAM  Take 1 tablet (50 mg total) by mouth every 6 (six) hours as needed for severe pain (pain score 7-10).   Trelegy Ellipta  100-62.5-25 MCG/ACT Aepb Generic drug: Fluticasone-Umeclidin-Vilant INHALE 1 PUFF INTO LUNGS ONCE DAILY         Discharge Exam: Filed Weights   11/29/23 1254 11/29/23 2008 11/30/23 0500  Weight: 95.7 kg 92.9 kg 91.9 kg    GENERAL:  Alert, pleasant, no acute distress  HEENT:  EOMI CARDIOVASCULAR:  RRR, no murmurs appreciated RESPIRATORY:  Clear to auscultation, no wheezing, rales, or rhonchi GASTROINTESTINAL:  Soft, nontender, nondistended EXTREMITIES:  No LE edema bilaterally NEURO:  No new focal deficits appreciated SKIN:  No rashes noted PSYCH:  Appropriate mood and affect     Condition at discharge: improving  The results of significant diagnostics from this hospitalization (including imaging, microbiology, ancillary and laboratory) are listed below for reference.   Imaging  Studies: ECHOCARDIOGRAM COMPLETE Result Date: 11/30/2023    ECHOCARDIOGRAM REPORT   Patient Name:   Jay [name] Date of Exam: 11/30/2023 Medical Rec #:  [id number]       Height:       68.5 in Accession #:    [id number]      Weight:       202.6 lb Date of Birth:  1950/12/23       BSA:          2.066 m Patient Age:    73 years        BP:           114/78 mmHg Patient Gender: M               HR:           97 bpm. Exam Location:  [facility] Procedure: 2D Echo, Cardiac Doppler and Color Doppler (Both Spectral and Color            Flow Doppler were utilized during procedure). Indications:  CHF I50.9  History:        Patient has prior history of Echocardiogram examinations, most                 recent 11/27/2021. COPD, Arrythmias:Atrial Fibrillation; Risk                 Factors:Hypertension.  Sonographer:    Tinnie Gosling RDCS Referring Phys: [phone] EJIROGHENE E EMOKPAE IMPRESSIONS  1. Left ventricular ejection fraction, by estimation, is 60 to 65%. The left ventricle has normal function. Left ventricular endocardial border not optimally defined to evaluate regional wall motion. Left ventricular diastolic parameters are indeterminate.  2. Right ventricular systolic function is normal. The right ventricular size is normal. Tricuspid regurgitation signal is inadequate for assessing PA pressure.  3. The mitral valve is normal in structure. Trivial mitral valve regurgitation. No evidence of mitral stenosis.  4. The aortic valve was not well visualized.  5. Technically difficult study, limited visualization. FINDINGS  Left Ventricle: Left ventricular ejection fraction, by estimation, is 60 to 65%. The left ventricle has normal function. Left ventricular endocardial border not optimally defined to evaluate regional wall motion. The left ventricular internal cavity size was normal in size. Left ventricular diastolic parameters are indeterminate. Right Ventricle: The right ventricular size is normal. Right vetricular wall  thickness was not well visualized. Right ventricular systolic function is normal. Tricuspid regurgitation signal is inadequate for assessing PA pressure. Left Atrium: Left atrial size was normal in size. Right Atrium: Right atrial size was not well visualized. Pericardium: The pericardium was not well visualized. Mitral Valve: The mitral valve is normal in structure. There is mild thickening of the mitral valve leaflet(s). There is mild calcification of the mitral valve leaflet(s). Mild mitral annular calcification. Trivial mitral valve regurgitation. No evidence  of mitral valve stenosis. Tricuspid Valve: The tricuspid valve is not well visualized. Tricuspid valve regurgitation is not demonstrated. No evidence of tricuspid stenosis. Aortic Valve: The aortic valve was not well visualized. Aortic valve mean gradient measures 3.1 mmHg. Aortic valve peak gradient measures 5.8 mmHg. Pulmonic Valve: The pulmonic valve was not well visualized. Aorta: The aortic root was not well visualized and the ascending aorta was not well visualized. Venous: The inferior vena cava was not well visualized. IAS/Shunts: The interatrial septum was not well visualized.  IVC IVC diam: 1.60 cm LEFT ATRIUM           Index LA Vol (A4C): 63.6 ml 30.78 ml/m  AORTIC VALVE AV Vmax:           120.63 cm/s AV Vmean:          82.838 cm/s AV VTI:            0.178 m AV Peak Grad:      5.8 mmHg AV Mean Grad:      3.1 mmHg LVOT Vmax:         95.00 cm/s LVOT Vmean:        56.500 cm/s LVOT VTI:          0.144 m LVOT/AV VTI ratio: 0.81  SHUNTS Systemic VTI: 0.14 m Dorn Ross MD Electronically signed by Dorn Ross MD Signature Date/Time: 11/30/2023/2:33:09 PM    Final    CT ABDOMEN PELVIS W CONTRAST Result Date: 11/29/2023 EXAM: CT ABDOMEN AND PELVIS WITH CONTRAST 11/29/2023 04:17:46 PM TECHNIQUE: CT of the abdomen and pelvis was performed with the administration of 100 mL iohexol  (OMNIPAQUE ) 300 MG/ML solution. Multiplanar reformatted images  are provided for  review. Automated exposure control, iterative reconstruction, and/or weight-based adjustment of the mA/kV was utilized to reduce the radiation dose to as low as reasonably achievable. COMPARISON: None available. CLINICAL HISTORY: Abdominal pain, acute, nonlocalized. FINDINGS: LOWER CHEST: No acute abnormality. LIVER: The liver is unremarkable. GALLBLADDER AND BILE DUCTS: Normal gallbladder and biliary tract. SPLEEN: No acute abnormality. PANCREAS: No acute abnormality. ADRENAL GLANDS: No acute abnormality. KIDNEYS, URETERS AND BLADDER: No stones in the kidneys or ureters. No hydronephrosis. No perinephric or periureteral stranding. Urinary bladder is unremarkable. GI AND BOWEL: Stomach demonstrates no acute abnormality. There is no bowel obstruction. Appendix not identified but no secondary signs of appendicitis. PERITONEUM AND RETROPERITONEUM: No ascites. No free air. VASCULATURE: Vascular calcifications of the aorta. LYMPH NODES: No lymphadenopathy. REPRODUCTIVE ORGANS: No acute abnormality. BONES AND SOFT TISSUES: No acute osseous abnormality. No focal soft tissue abnormality. IMPRESSION: 1. No acute findings in the abdomen or pelvis. Electronically signed by: Norleen Boxer MD 11/29/2023 05:09 PM EST RP Workstation: [id number]   DG Chest 2 View Result Date: 11/29/2023 EXAM: 2 VIEW(S) XRAY OF THE CHEST 11/29/2023 01:03:00 PM COMPARISON: 11/18/2023 and CT of 11/23/2023. CLINICAL HISTORY: CHF, SOB. FINDINGS: LUNGS AND PLEURA: Moderate hyperinflation. Bibasilar scarring. Chronic pulmonary interstitial thickening / coarsening. No pleural effusion. No pneumothorax. HEART AND MEDIASTINUM: Patient rotated right into the frontal. Mild cardiomegaly. BONES AND SOFT TISSUES: No acute osseous abnormality. IMPRESSION: 1. Hyperinflation and cardiomegaly with bibasilar scarring. 2. Electronically signed by: Rockey Kilts MD 11/29/2023 01:57 PM EST RP Workstation: [id number]   CT Angio Chest W/Cm &/Or Wo  Cm Result Date: 11/23/2023 CLINICAL DATA:  Shortness of breath for 6 months. Elevated D-dimer levels. EXAM: CT ANGIOGRAPHY CHEST WITH CONTRAST TECHNIQUE: Multidetector CT imaging of the chest was performed using the standard protocol during bolus administration of intravenous contrast. Multiplanar CT image reconstructions and MIPs were obtained to evaluate the vascular anatomy. RADIATION DOSE REDUCTION: This exam was performed according to the departmental dose-optimization program which includes automated exposure control, adjustment of the mA and/or kV according to patient size and/or use of iterative reconstruction technique. CONTRAST:  80mL ISOVUE -370 IOPAMIDOL  (ISOVUE -370) INJECTION 76% COMPARISON:  Noncontrast chest CT 12/16/2021. Radiographs 11/18/2023. FINDINGS: Cardiovascular: The pulmonary arteries are well opacified with contrast to the level of the segmental branches. There is no evidence of acute pulmonary embolism. Atherosclerosis of the aorta, great vessels and coronary arteries. Although there is limited opacification of the aortic lumen, there is no evidence of acute systemic arterial abnormality. The heart size is normal. There is no pericardial effusion. Mediastinum/Nodes: There are no enlarged mediastinal, hilar or axillary lymph nodes. The thyroid  gland, trachea and esophagus demonstrate no significant findings. Lungs/Pleura: No pleural effusion or pneumothorax. Stable mild centrilobular and paraseptal emphysema with asymmetric linear scarring at the right lung base. No airspace disease or suspicious pulmonary nodularity. Upper abdomen: No acute findings are seen within the visualized upper abdomen. There is reflux of contrast into the IVC and right hepatic vein. Musculoskeletal/Chest wall: There is no chest wall mass or suspicious osseous finding. Review of the MIP images confirms the above findings. IMPRESSION: 1. No evidence of acute pulmonary embolism or other acute chest findings. 2.  Reflux of contrast into the IVC and right hepatic vein, nonspecific, but can be seen in the setting of right heart failure. 3. Aortic Atherosclerosis (ICD10-I70.0) and Emphysema (ICD10-J43.9). Electronically Signed   By: Elsie Perone M.D.   On: 11/23/2023 12:38   DG Chest 2 View Result Date: 11/22/2023 CLINICAL DATA:  Shortness of  breath EXAM: CHEST - 2 VIEW COMPARISON:  Chest x-ray performed October 03, 2021 FINDINGS: Bibasilar scarring. Heart mediastinum is not significantly changed. No pleural effusion or pneumothorax. IMPRESSION: 1. No significant interval change. 2. No plain film evidence of acute process. Electronically Signed   By: Maude Naegeli M.D.   On: 11/22/2023 13:32    Microbiology: Results for orders placed or performed during the hospital encounter of 10/03/21  Resp Panel by RT-PCR (Flu A&B, Covid) Anterior Nasal Swab     Status: None   Collection Time: 10/03/21 12:57 PM   Specimen: Anterior Nasal Swab  Result Value Ref Range Status   SARS Coronavirus 2 by RT PCR NEGATIVE NEGATIVE Final    Comment: (NOTE) SARS-CoV-2 target nucleic acids are NOT DETECTED.  The SARS-CoV-2 RNA is generally detectable in upper respiratory specimens during the acute phase of infection. The lowest concentration of SARS-CoV-2 viral copies this assay can detect is 138 copies/mL. A negative result does not preclude SARS-Cov-2 infection and should not be used as the sole basis for treatment or other patient management decisions. A negative result may occur with  improper specimen collection/handling, submission of specimen other than nasopharyngeal swab, presence of viral mutation(s) within the areas targeted by this assay, and inadequate number of viral copies(<138 copies/mL). A negative result must be combined with clinical observations, patient history, and epidemiological information. The expected result is Negative.  Fact Sheet for Patients:   bloggercourse.com  Fact Sheet for Healthcare Providers:  seriousbroker.it  This test is no t yet approved or cleared by the [place]  FDA and  has been authorized for detection and/or diagnosis of SARS-CoV-2 by FDA under an Emergency Use Authorization (EUA). This EUA will remain  in effect (meaning this test can be used) for the duration of the COVID-19 declaration under Section 564(b)(1) of the Act, 21 U.S.C.section 360bbb-3(b)(1), unless the authorization is terminated  or revoked sooner.       Influenza A by PCR NEGATIVE NEGATIVE Final   Influenza B by PCR NEGATIVE NEGATIVE Final    Comment: (NOTE) The Xpert Xpress SARS-CoV-2/FLU/RSV plus assay is intended as an aid in the diagnosis of influenza from Nasopharyngeal swab specimens and should not be used as a sole basis for treatment. Nasal washings and aspirates are unacceptable for Xpert Xpress SARS-CoV-2/FLU/RSV testing.  Fact Sheet for Patients: bloggercourse.com  Fact Sheet for Healthcare Providers: seriousbroker.it  This test is not yet approved or cleared by the [place]  FDA and has been authorized for detection and/or diagnosis of SARS-CoV-2 by FDA under an Emergency Use Authorization (EUA). This EUA will remain in effect (meaning this test can be used) for the duration of the COVID-19 declaration under Section 564(b)(1) of the Act, 21 U.S.C. section 360bbb-3(b)(1), unless the authorization is terminated or revoked.  Performed at [facility], [address], [place], [place] [address]     Labs: CBC: Recent Labs  Lab 11/29/23 1346  WBC 7.7  HGB 14.0  HCT 43.0  MCV 93.5  PLT 231   Basic Metabolic Panel: Recent Labs  Lab 11/29/23 1346 11/30/23 0420  NA 139 139  K 4.2 4.3  CL 103 100  CO2 24 25  GLUCOSE 145* 148*  BUN 21 23  CREATININE 1.23 1.31*  CALCIUM  9.1 9.4   Liver Function  Tests: Recent Labs  Lab 11/29/23 1425  AST 15  ALT 12  ALKPHOS 74  BILITOT 0.4  PROT 6.9  ALBUMIN 3.9   CBG: No results for input(s): GLUCAP  in the last 168 hours.  Discharge time spent: 40 minutes.  Length of inpatient stay: 0 days  Signed: Carliss LELON Canales, DO [facility] 11/30/2023

## 2023-11-30 NOTE — Telephone Encounter (Signed)
 Patient Product/process Development Scientist completed.    The patient is insured through [employer]. Patient has Medicare and is not eligible for a copay card, but may be able to apply for patient assistance or Medicare RX Payment Plan (Patient Must reach out to their plan, if eligible for payment plan), if available.    Ran test claim for Breztri and the current 30 day co-pay is $47.00.   This test claim was processed through [facility]- copay amounts may vary at other pharmacies due to pharmacy/plan contracts, or as the patient moves through the different stages of their insurance plan.     Reyes Sharps, CPHT Pharmacy Technician Patient Advocate Specialist Lead [facility] Patient Advocate Team Direct Number: [phone]  Fax: [phone]

## 2023-11-30 NOTE — Progress Notes (Signed)
  Echocardiogram 2D Echocardiogram has been performed.  Jay [name] 11/30/2023, 9:23 AM

## 2023-12-01 ENCOUNTER — Emergency Department (HOSPITAL_COMMUNITY)
Admission: EM | Admit: 2023-12-01 | Discharge: 2023-12-01 | Disposition: A | Discharge status: Home / Self Care | Attending: Emergency Medicine | Admitting: Emergency Medicine

## 2023-12-01 ENCOUNTER — Other Ambulatory Visit: Payer: Self-pay

## 2023-12-01 ENCOUNTER — Emergency Department (HOSPITAL_COMMUNITY)

## 2023-12-01 ENCOUNTER — Telehealth (HOSPITAL_COMMUNITY): Payer: Self-pay | Admitting: Psychiatry

## 2023-12-01 ENCOUNTER — Telehealth: Payer: Self-pay

## 2023-12-01 ENCOUNTER — Encounter (HOSPITAL_COMMUNITY): Payer: Self-pay

## 2023-12-01 ENCOUNTER — Ambulatory Visit: Payer: Self-pay | Admitting: *Deleted

## 2023-12-01 DIAGNOSIS — S0990XA Unspecified injury of head, initial encounter: Secondary | ICD-10-CM | POA: Diagnosis not present

## 2023-12-01 DIAGNOSIS — R42 Dizziness and giddiness: Secondary | ICD-10-CM | POA: Diagnosis not present

## 2023-12-01 DIAGNOSIS — S0083XA Contusion of other part of head, initial encounter: Secondary | ICD-10-CM | POA: Diagnosis not present

## 2023-12-01 DIAGNOSIS — R55 Syncope and collapse: Secondary | ICD-10-CM | POA: Insufficient documentation

## 2023-12-01 DIAGNOSIS — Z7982 Long term (current) use of aspirin: Secondary | ICD-10-CM | POA: Diagnosis not present

## 2023-12-01 DIAGNOSIS — W1830XA Fall on same level, unspecified, initial encounter: Secondary | ICD-10-CM | POA: Diagnosis not present

## 2023-12-01 DIAGNOSIS — S199XXA Unspecified injury of neck, initial encounter: Secondary | ICD-10-CM | POA: Diagnosis not present

## 2023-12-01 DIAGNOSIS — E860 Dehydration: Secondary | ICD-10-CM | POA: Diagnosis not present

## 2023-12-01 DIAGNOSIS — W19XXXA Unspecified fall, initial encounter: Secondary | ICD-10-CM | POA: Diagnosis not present

## 2023-12-01 DIAGNOSIS — I6523 Occlusion and stenosis of bilateral carotid arteries: Secondary | ICD-10-CM | POA: Diagnosis not present

## 2023-12-01 LAB — I-STAT CHEM 8, ED
BUN: 34 mg/dL — ABNORMAL HIGH (ref 8–23)
Calcium, Ion: 1.15 mmol/L (ref 1.15–1.40)
Chloride: 101 mmol/L (ref 98–111)
Creatinine, Ser: 1.6 mg/dL — ABNORMAL HIGH (ref 0.61–1.24)
Glucose, Bld: 105 mg/dL — ABNORMAL HIGH (ref 70–99)
HCT: 44 % (ref 39.0–52.0)
Hemoglobin: 15 g/dL (ref 13.0–17.0)
Potassium: 4.1 mmol/L (ref 3.5–5.1)
Sodium: 142 mmol/L (ref 135–145)
TCO2: 26 mmol/L (ref 22–32)

## 2023-12-01 MED ORDER — SODIUM CHLORIDE 0.9 % IV BOLUS
500.0000 mL | Freq: Once | INTRAVENOUS | Status: AC
  Administered 2023-12-01: 500 mL via INTRAVENOUS

## 2023-12-01 NOTE — ED Triage Notes (Signed)
 Pt arrived via [facility] with fall is on blood thinners was recently Dx with R sided heart failure was placed on medication that makes him feel dizzy. Denies LOC, did hit his head

## 2023-12-01 NOTE — Telephone Encounter (Signed)
 Recommended ED and patient reports he doesn't want to go to his children. Attempted to instruct patient to be evaluated in ED due to hitting head. Takes aspirin  but no other blood thinners. Please advise.   CAL notified.    FYI Only or Action Required?: FYI only for provider: ED advised.  Patient was last seen in primary care on 11/19/2023 by [name], NP.  Called Nurse Triage reporting Fall.  Symptoms began today.  Interventions attempted: Nothing.  Symptoms are: gradually worsening.  Triage Disposition: Go to ED Now (Notify PCP)  Patient/caregiver understands and will follow disposition?: No, wishes to speak with PCP         Copied from CRM #[id number]. Topic: Clinical - Red Word Triage >> Dec 01, 2023  2:58 PM Ashley R wrote: Red Word that prompted transfer to Nurse Triage: fell today, hit head, very dizzy, dizzyness worse when standing up, recently started furosemide  after CT scan and appt to drain fluid retention around heart. Believes this could be contributing. Reason for Disposition  Sounds like a serious injury to the triager  Answer Assessment - Initial Assessment Questions Recommended ED now  and if sx worsen call 911. Patient 's daughter and son report patient does not want to go to ED. Unsure if patient will go for evaluation. Patient taking aspirin  .      1. MECHANISM: How did the fall happen?     Stood up from couch, did not use walker got dizzy and came out of bathroom and tried to grab couch hit corner of coffee table. Noted indention in front of forehead  2. DOMESTIC VIOLENCE AND ELDER ABUSE SCREENING: Did you fall because someone pushed you or tried to hurt you? If Yes, ask: Are you safe now?     na 3. ONSET: When did the fall happen? (e.g., minutes, hours, or days ago)     Today  4. LOCATION: What part of the body hit the ground? (e.g., back, buttocks, head, hips, knees, hands, head, stomach)     forehead 5. INJURY: Did you hurt  (injure) yourself when you fell? If Yes, ask: What did you injure? Tell me more about this? (e.g., body area; type of injury; pain severity)     Patient denies injury  6. PAIN: Is there any pain? If Yes, ask: How bad is the pain? (e.g., Scale 0-10; or none, mild,      No pain now.  7. SIZE: For cuts, bruises, or swelling, ask: How large is it? (e.g., inches or centimeters)      Indention noted to front of forehead 8. PREGNANCY: Is there any chance you are pregnant? When was your last menstrual period?     na 9. OTHER SYMPTOMS: Do you have any other symptoms? (e.g., dizziness, fever, weakness; new-onset or worsening).      Dizziness, , headache, blurred vision but reports he needs glasses. No nausea no vomiting  10. CAUSE: What do you think caused the fall (or falling)? (e.g., dizzy spell, tripped)       dizziness  Protocols used: Falls and [facility]

## 2023-12-01 NOTE — Telephone Encounter (Signed)
 Patient's son and daughter reached out post medical hospitalization.  Patient gave permission to speak with both of them.  His hospitalized for acute exacerbation of heart failure and needed IV diuresis.  They were contacted via telephone for approximately 15 minutes. His kids are planning to take over management of his medications moving forward as their is concern that he has not been taking the medication as prescribed. Went through the medications with them today.

## 2023-12-01 NOTE — Transitions of Care (Post Inpatient/ED Visit) (Signed)
   12/01/2023  Name: Jay [name] MRN: [id number] DOB: December 21, 1950  Today's TOC FU Call Status: Today's TOC FU Call Status:: Unsuccessful Call (1st Attempt) Unsuccessful Call (1st Attempt) Date: 12/01/23  Attempted to reach the patient regarding the most recent Inpatient/ED visit.  Follow Up Plan: Additional outreach attempts will be made to reach the patient to complete the Transitions of Care (Post Inpatient/ED visit) call.   Signature Julian Lemmings, LPN [facility] Nurse Health Advisor Direct Dial [phone]

## 2023-12-01 NOTE — Telephone Encounter (Signed)
 Son Lanie) called to follow-up on next steps regarding patient being released from the hospital and recommendations from lung doctor.  Son stated can also call Sister Marcelle) at [phone].  Son noted patient has had a fall recently.

## 2023-12-01 NOTE — Telephone Encounter (Signed)
 Appt scheduled

## 2023-12-01 NOTE — Discharge Instructions (Signed)
Follow-up with your doctor as planned 

## 2023-12-02 NOTE — ED Provider Notes (Signed)
 [place] EMERGENCY DEPARTMENT AT [facility] Provider Note   CSN: [id number] Arrival date & time: 12/01/23  1641     Patient presents with: Jay [name]   Jay [name] is a 73 y.o. male.   Patient has been recently admitted to the hospital and complained of some dizziness today he was diuresed in the hospital.  Patient fell and has had  The history is provided by the patient, a relative and medical records. No language interpreter was used.  Fall This is a new problem. The current episode started 6 to 12 hours ago. The problem occurs rarely. The problem has been resolved. Pertinent negatives include no chest pain, no abdominal pain and no headaches. Nothing aggravates the symptoms. Nothing relieves the symptoms. He has tried nothing for the symptoms.       Prior to Admission medications   Medication Sig Start Date End Date Taking? Authorizing Provider  acetaminophen  (TYLENOL ) 500 MG tablet Take 1,000 mg by mouth every 6 (six) hours as needed for moderate pain.   Yes [provider]  amLODipine  (NORVASC ) 5 MG tablet Take 1 tablet (5 mg total) by mouth every morning. 11/19/23  Yes Nafziger, Darleene, NP  aspirin  81 MG chewable tablet Chew 81 mg by mouth 2 (two) times daily.   Yes [provider]  atorvastatin  (LIPITOR) 40 MG tablet Take 1 tablet (40 mg total) by mouth daily. 11/19/23  Yes Nafziger, Darleene, NP  buPROPion  (WELLBUTRIN  XL) 150 MG 24 hr tablet Take 3 tablets (450 mg total) by mouth every morning. 08/24/23 08/23/24 Yes Carvin Arvella HERO, MD  donepezil  (ARICEPT ) 10 MG tablet Take 1 tablet (10 mg total) by mouth at bedtime. 11/19/23  Yes Nafziger, Darleene, NP  DULoxetine  (CYMBALTA ) 60 MG capsule Take 2 capsules (120 mg total) by mouth every evening. Patient taking differently: Take 60 mg by mouth 2 (two) times daily. 08/24/23  Yes Carvin Arvella HERO, MD  esomeprazole  (NEXIUM ) 40 MG capsule Take 1 capsule (40 mg total) by mouth every morning. 11/19/23  Yes Nafziger, Darleene,  NP  famotidine  (PEPCID ) 20 MG tablet Take 1 tablet by mouth once daily 11/09/23  Yes Nafziger, Darleene, NP  finasteride  (PROSCAR ) 5 MG tablet Take 1 tablet by mouth once daily 10/12/23  Yes Nafziger, Darleene, NP  Fluticasone-Umeclidin-Vilant (TRELEGY ELLIPTA ) 100-62.5-25 MCG/ACT AEPB INHALE 1 PUFF INTO LUNGS ONCE DAILY 05/24/23  Yes Hope Almarie ORN, NP  furosemide  (LASIX ) 40 MG tablet Take 1 tablet (40 mg total) by mouth daily. 11/30/23 11/29/24 Yes Arlon Carliss ORN, DO  lurasidone  (LATUDA ) 40 MG TABS tablet Take 1 tablet (40 mg total) by mouth at bedtime. 08/24/23  Yes Carvin Arvella HERO, MD  metoprolol  tartrate (LOPRESSOR ) 50 MG tablet Take 1 tablet (50 mg total) by mouth 2 (two) times daily. 11/19/23  Yes Nafziger, Darleene, NP  tamsulosin  (FLOMAX ) 0.4 MG CAPS capsule Take 1 capsule (0.4 mg total) by mouth daily. 11/19/23  Yes Nafziger, Darleene, NP  traMADol  (ULTRAM ) 50 MG tablet Take 1 tablet (50 mg total) by mouth every 6 (six) hours as needed for severe pain (pain score 7-10). 11/19/23  Yes Nafziger, Darleene, NP    Allergies: Patient has no known allergies.    Review of Systems  Constitutional:  Negative for appetite change and fatigue.  HENT:  Negative for congestion, ear discharge and sinus pressure.   Eyes:  Negative for discharge.  Respiratory:  Negative for cough.   Cardiovascular:  Negative for chest pain.  Gastrointestinal:  Negative for abdominal  pain and diarrhea.  Genitourinary:  Negative for frequency and hematuria.  Musculoskeletal:  Negative for back pain.  Skin:  Negative for rash.  Neurological:  Positive for dizziness. Negative for seizures and headaches.  Psychiatric/Behavioral:  Negative for hallucinations.     Updated Vital Signs BP 108/70 (BP Location: Left Arm)   Pulse 90   Temp (!) 97.5 F (36.4 C) (Oral)   Resp 18   Ht 5' 8 (1.727 m)   Wt 91.6 kg   SpO2 93%   BMI 30.71 kg/m   Physical Exam Vitals and nursing note reviewed.  Constitutional:      Appearance: He is  well-developed.  HENT:     Head: Normocephalic.     Nose: Nose normal.  Eyes:     General: No scleral icterus.    Conjunctiva/sclera: Conjunctivae normal.  Neck:     Thyroid : No thyromegaly.  Cardiovascular:     Rate and Rhythm: Normal rate and regular rhythm.     Heart sounds: No murmur heard.    No friction rub. No gallop.  Pulmonary:     Breath sounds: No stridor. No wheezing or rales.  Chest:     Chest wall: No tenderness.  Abdominal:     General: There is no distension.     Tenderness: There is no abdominal tenderness. There is no rebound.  Musculoskeletal:        General: Normal range of motion.     Cervical back: Neck supple.  Lymphadenopathy:     Cervical: No cervical adenopathy.  Skin:    Findings: No erythema or rash.  Neurological:     Mental Status: He is alert and oriented to person, place, and time.     Motor: No abnormal muscle tone.     Coordination: Coordination normal.  Psychiatric:        Behavior: Behavior normal.     (all labs ordered are listed, but only abnormal results are displayed) Labs Reviewed  I-STAT CHEM 8, ED - Abnormal; Notable for the following components:      Result Value   BUN 34 (*)    Creatinine, Ser 1.60 (*)    Glucose, Bld 105 (*)    All other components within normal limits    EKG: None  Radiology: CT Head Wo Contrast Result Date: 12/01/2023 EXAM: CT HEAD AND CERVICAL SPINE 12/01/2023 05:18:58 PM TECHNIQUE: CT of the head and cervical spine was performed without the administration of intravenous contrast. Multiplanar reformatted images are provided for review. Automated exposure control, iterative reconstruction, and/or weight based adjustment of the mA/kV was utilized to reduce the radiation dose to as low as reasonably achievable. COMPARISON: CT head and c-spine 03/03/2021 CLINICAL HISTORY: Head trauma, intracranial arterial injury suspected. FINDINGS: CT HEAD BRAIN AND VENTRICLES: Nonspecific punctate hyperdensity along  the right frontal lobe (series 2, image 50) likely represents a cavernoma or artifact. No definite traumatic finding. No acute intracranial hemorrhage. No mass effect or midline shift. No abnormal extra-axial fluid collection. No evidence of acute infarct. No hydrocephalus. 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 SINUSES AND MASTOIDS: No acute abnormality. SOFT TISSUES AND SKULL: Atherosclerotic calcifications are present within the cavernous internal carotid arteries. No acute skull fracture. No acute soft tissue abnormality. 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 No severe osseous central canal stenosis. No acute fracture or traumatic malalignment. DEGENERATIVE CHANGES: Multilevel severe degenerative changes of the spine with associated  multilevel severe osseous neural foraminal stenosis. No severe osseous central canal stenosis. SOFT TISSUES: Paraseptal emphysematous changes. No prevertebral soft tissue swelling. IMPRESSION: 1. No definite acute intracranial abnormality. Nonspecific punctate hyperdensity in the right frontal lobe parenchyma, likely representing a cavernoma or artifact. 2. No acute fracture or traumatic listhesis of the cervical spine. 3. 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 Electronically signed by: Morgane Naveau MD 12/01/2023 05:30 PM EST RP Workstation: [id number]   CT Cervical Spine Wo Contrast Result Date: 12/01/2023 EXAM: CT HEAD AND CERVICAL SPINE 12/01/2023 05:18:58 PM TECHNIQUE: CT of the head and cervical spine was performed without the administration of intravenous contrast. Multiplanar reformatted images are provided for review. Automated exposure control, iterative reconstruction, and/or weight based adjustment of the mA/kV was utilized to reduce the radiation dose to as low as  reasonably achievable. COMPARISON: CT head and c-spine 03/03/2021 CLINICAL HISTORY: Head trauma, intracranial arterial injury suspected. FINDINGS: CT HEAD BRAIN AND VENTRICLES: Nonspecific punctate hyperdensity along the right frontal lobe (series 2, image 50) likely represents a cavernoma or artifact. No definite traumatic finding. No acute intracranial hemorrhage. No mass effect or midline shift. No abnormal extra-axial fluid collection. No evidence of acute infarct. No hydrocephalus. 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 SINUSES AND MASTOIDS: No acute abnormality. SOFT TISSUES AND SKULL: Atherosclerotic calcifications are present within the cavernous internal carotid arteries. No acute skull fracture. No acute soft tissue abnormality. 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 No severe osseous central canal stenosis. No acute fracture or traumatic malalignment. DEGENERATIVE CHANGES: Multilevel severe degenerative changes of the spine with associated multilevel severe osseous neural foraminal stenosis. No severe osseous central canal stenosis. SOFT TISSUES: Paraseptal emphysematous changes. No prevertebral soft tissue swelling. IMPRESSION: 1. No definite acute intracranial abnormality. Nonspecific punctate hyperdensity in the right frontal lobe parenchyma, likely representing a cavernoma or artifact. 2. No acute fracture or traumatic listhesis of the cervical spine. 3. 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 Electronically signed by: Morgane Naveau MD 12/01/2023 05:30 PM EST RP Workstation: [id number]     Procedures   Medications Ordered in the ED  sodium chloride  0.9 % bolus 500 mL (0 mLs Intravenous Stopped 12/01/23 2205)                                    Medical Decision Making Amount and/or Complexity  of Data Reviewed Radiology: ordered.   Patient with mild dehydration and contusion to head.  CT scan unremarkable.  Patient given 5 cc of fluids and feels better.  Patient will follow-up as planned with his doctor     Final diagnoses:  Dehydration  Near syncope    ED Discharge Orders     None          Suzette Pac, MD 12/02/23 1420

## 2023-12-02 NOTE — Telephone Encounter (Signed)
 Spoke to son who verbalized understanding of providers recommendations. Son had no further questions at this time.

## 2023-12-03 ENCOUNTER — Ambulatory Visit: Payer: Self-pay | Admitting: Adult Health

## 2023-12-03 ENCOUNTER — Encounter: Payer: Self-pay | Admitting: Adult Health

## 2023-12-03 ENCOUNTER — Ambulatory Visit: Admitting: Adult Health

## 2023-12-03 VITALS — BP 116/84 | HR 87 | Temp 97.7°F

## 2023-12-03 DIAGNOSIS — I5031 Acute diastolic (congestive) heart failure: Secondary | ICD-10-CM

## 2023-12-03 DIAGNOSIS — J449 Chronic obstructive pulmonary disease, unspecified: Secondary | ICD-10-CM | POA: Diagnosis not present

## 2023-12-03 DIAGNOSIS — I951 Orthostatic hypotension: Secondary | ICD-10-CM

## 2023-12-03 DIAGNOSIS — I4811 Longstanding persistent atrial fibrillation: Secondary | ICD-10-CM

## 2023-12-03 LAB — BASIC METABOLIC PANEL WITH GFR
BUN: 33 mg/dL — ABNORMAL HIGH (ref 6–23)
CO2: 25 meq/L (ref 19–32)
Calcium: 8.9 mg/dL (ref 8.4–10.5)
Chloride: 102 meq/L (ref 96–112)
Creatinine, Ser: 1.42 mg/dL (ref 0.40–1.50)
GFR: 49.13 mL/min — ABNORMAL LOW (ref >60.00)
Glucose, Bld: 147 mg/dL — ABNORMAL HIGH (ref 70–99)
Potassium: 4.1 meq/L (ref 3.5–5.1)
Sodium: 137 meq/L (ref 135–145)

## 2023-12-03 NOTE — Progress Notes (Signed)
 Subjective:    Patient ID: Jay [name], male    DOB: 1950/08/16, 73 y.o.   MRN: [id number]  HPI 73 year old male who  has a past medical history of Anxiety, Arthritis, COPD (chronic obstructive pulmonary disease) (HCC), Depression, Dysrhythmia, GERD (gastroesophageal reflux disease), Hypertension, PAF (paroxysmal atrial fibrillation) (HCC), and Shortness of breath.  He presents to the office today for TCM visit  Admit Date 11/29/2023 Discharge Date 11/30/2023  He presented to the emergency room with complaints of difficulty breathing with exertion, abdominal distention, intermittent bilateral lower extremity swelling and dizziness with standing.  Chest pain, abdominal pain or urinary symptoms.  He did have a recent outpatient which included D-dimer which was positive and subsequent CTA of the chest on 11/23/2023 which was negative for PE or other acute chest findings but consistent with right heart failure.  In the ER his chest x-ray with hyperinflation and cardiomegaly with bibasilar scarring.  CT abdomen pelvis with contrast showed no acute findings.  He was given a 500 mL bolus initially subsequently given Lasix  40 mg IV x 1 dose, DuoNebs Solumedrol 125 mg given  Hospital Course  Acute Exacerbation of HFpEF - His echo noted EF of 60 to 65%.  Weight gain, shortness of breath, and mild edema on presentation.  He responded well to IV diuresis and shortness of breath resolved.  He was transition to p.o. Lasix  40 mg  to take daily upon discharge and to verify treatment.  Atrial fibrillation - Rate controlled through hospital admission.  Resume metoprolol  and advised to follow-up with cardiology as directed  COPD - Did not appear to be in acute exacerbation.  Did receive IV Solu-Medrol  x 1 with resolution of mild wheezing.  I continued on home regiment  Today he presents to the office with his daughter who is helping manage his care.  She reports that when he first got home from the  hospital he was doing very well but within a day he was getting dizzy especially with standing feeling fatigued.  They were checking his blood pressure at home and were getting readings in the 1 teens over 80s while sitting but when he would stand up and feel dizzy his blood pressure was dropping to the 70s over 70s to 80s.  He does have an appointment scheduled with cardiology December 4.  His daughter reports that he has not been drinking much fluid throughout the day despite being on Lasix .    Review of Systems  Constitutional:  Positive for fatigue.  HENT: Negative.    Eyes: Negative.   Respiratory: Negative.    Cardiovascular: Negative.   Gastrointestinal: Negative.   Endocrine: Negative.   Genitourinary: Negative.   Musculoskeletal:  Positive for gait problem.  Skin: Negative.   Allergic/Immunologic: Negative.   Neurological:  Positive for dizziness.  Hematological: Negative.   Psychiatric/Behavioral: Negative.     Past Medical History:  Diagnosis Date   Anxiety    Arthritis    COPD (chronic obstructive pulmonary disease) (HCC)    Depression    Dysrhythmia    GERD (gastroesophageal reflux disease)    Hypertension    PAF (paroxysmal atrial fibrillation) (HCC)    Shortness of breath    with excertion    Social History   Socioeconomic History   Marital status: Married    Spouse name: Not on file   Number of children: Not on file   Years of education: Not on file   Highest education level: 9th grade  Occupational History   Not on file  Tobacco Use   Smoking status: Former    Current packs/day: 0.00    Types: Cigarettes    Quit date: 01/12/1993    Years since quitting: 30.9   Smokeless tobacco: Never  Vaping Use   Vaping status: Never Used  Substance and Sexual Activity   Alcohol use: No    Comment: Quit 1995   Drug use: No   Sexual activity: Not on file  Other Topics Concern   Not on file  Social History Narrative   Not on file   Social Drivers of  Health   Financial Resource Strain: Medium Risk (11/18/2023)   Overall Financial Resource Strain (CARDIA)    Difficulty of Paying Living Expenses: Somewhat hard  Food Insecurity: No Food Insecurity (11/29/2023)   Hunger Vital Sign    Worried About Running Out of Food in the Last Year: Never true    Ran Out of Food in the Last Year: Never true  Transportation Needs: No Transportation Needs (11/29/2023)   PRAPARE - Administrator, Civil Service (Medical): No    Lack of Transportation (Non-Medical): No  Physical Activity: Inactive (11/18/2023)   Exercise Vital Sign    Days of Exercise per Week: 0 days    Minutes of Exercise per Session: Not on file  Stress: Stress Concern Present (11/18/2023)   [employer] of Occupational Health - Occupational Stress Questionnaire    Feeling of Stress: Very much  Social Connections: Socially Integrated (11/29/2023)   Social Connection and Isolation Panel    Frequency of Communication with Friends and Family: Twice a week    Frequency of Social Gatherings with Friends and Family: Once a week    Attends Religious Services: More than 4 times per year    Active Member of Clubs or Organizations: No    Attends Engineer, Structural: More than 4 times per year    Marital Status: Married  Catering Manager Violence: Not At Risk (11/29/2023)   Humiliation, Afraid, Rape, and Kick questionnaire    Fear of Current or Ex-Partner: No    Emotionally Abused: No    Physically Abused: No    Sexually Abused: No    Past Surgical History:  Procedure Laterality Date   APPENDECTOMY     COLONOSCOPY WITH PROPOFOL  N/A 03/25/2022   Procedure: COLONOSCOPY WITH PROPOFOL ;  Surgeon: Shaaron Lamar HERO, MD;  Location: [facility];  Service: Endoscopy;  Laterality: N/A;  9:00 am   EYE SURGERY     POLYPECTOMY  03/25/2022   Procedure: POLYPECTOMY INTESTINAL;  Surgeon: Shaaron Lamar HERO, MD;  Location: [facility];  Service: Endoscopy;;    Family History   Problem Relation Age of Onset   Hypertension Mother    Cancer Father    Hypertension Father    Hypertension Sister    Cancer Brother    Hypertension Brother    Cancer - Colon Neg Hx    Colon polyps Neg Hx     No Known Allergies  Current Outpatient Medications on File Prior to Visit  Medication Sig Dispense Refill   acetaminophen  (TYLENOL ) 500 MG tablet Take 1,000 mg by mouth every 6 (six) hours as needed for moderate pain.     amLODipine  (NORVASC ) 5 MG tablet Take 1 tablet (5 mg total) by mouth every morning. 90 tablet 3   aspirin  81 MG chewable tablet Chew 81 mg by mouth 2 (two) times daily.     atorvastatin  (LIPITOR)  40 MG tablet Take 1 tablet (40 mg total) by mouth daily. 90 tablet 3   buPROPion  (WELLBUTRIN  XL) 150 MG 24 hr tablet Take 3 tablets (450 mg total) by mouth every morning. 90 tablet 2   donepezil  (ARICEPT ) 10 MG tablet Take 1 tablet (10 mg total) by mouth at bedtime. 90 tablet 3   DULoxetine  (CYMBALTA ) 60 MG capsule Take 2 capsules (120 mg total) by mouth every evening. (Patient taking differently: Take 60 mg by mouth 2 (two) times daily.) 180 capsule 0   esomeprazole  (NEXIUM ) 40 MG capsule Take 1 capsule (40 mg total) by mouth every morning. 90 capsule 3   famotidine  (PEPCID ) 20 MG tablet Take 1 tablet by mouth once daily 90 tablet 0   finasteride  (PROSCAR ) 5 MG tablet Take 1 tablet by mouth once daily 90 tablet 0   Fluticasone-Umeclidin-Vilant (TRELEGY ELLIPTA ) 100-62.5-25 MCG/ACT AEPB INHALE 1 PUFF INTO LUNGS ONCE DAILY 60 each 0   furosemide  (LASIX ) 40 MG tablet Take 1 tablet (40 mg total) by mouth daily. 30 tablet 11   lurasidone  (LATUDA ) 40 MG TABS tablet Take 1 tablet (40 mg total) by mouth at bedtime. 90 tablet 0   metoprolol  tartrate (LOPRESSOR ) 50 MG tablet Take 1 tablet (50 mg total) by mouth 2 (two) times daily. 90 tablet 3   tamsulosin  (FLOMAX ) 0.4 MG CAPS capsule Take 1 capsule (0.4 mg total) by mouth daily. 90 capsule 3   traMADol  (ULTRAM ) 50 MG tablet  Take 1 tablet (50 mg total) by mouth every 6 (six) hours as needed for severe pain (pain score 7-10). 30 tablet 2   No current facility-administered medications on file prior to visit.    BP 116/84   Pulse 87   Temp 97.7 F (36.5 C) (Oral)   SpO2 97%       Objective:   Physical Exam Vitals and nursing note reviewed.  Constitutional:      Appearance: Normal appearance. He is obese.  Cardiovascular:     Rate and Rhythm: Normal rate. Rhythm irregular.     Pulses: Normal pulses.     Heart sounds: Normal heart sounds.  Pulmonary:     Effort: Pulmonary effort is normal.     Breath sounds: Normal breath sounds.  Skin:    General: Skin is warm and dry.  Neurological:     General: No focal deficit present.     Mental Status: He is alert and oriented to person, place, and time.  Psychiatric:        Mood and Affect: Mood normal.        Behavior: Behavior normal.        Thought Content: Thought content normal.        Judgment: Judgment normal.        Assessment & Plan:   1. Acute heart failure with preserved ejection fraction (HFpEF) (HCC) (Primary) - Reviewed all hospital notes, discharge instructions, labs, imaging and medication changes with the patient and his family member. All questions answered to the best of my ability  - Basic Metabolic Panel; Future - Basic Metabolic Panel  2. Longstanding persistent atrial fibrillation (HCC) - Continue with Metoprolol   - Basic Metabolic Panel; Future - Basic Metabolic Panel  3. Chronic obstructive pulmonary disease, unspecified COPD type (HCC) - Per pulmonary  - Basic Metabolic Panel; Future - Basic Metabolic Panel  4. Orthostatic hypotension - In the office while sitting his blood pressure was soft at 116/84, with standing he became symptomatic with dizziness this,  blood pressure was measured at 80/60.  I am going to have him stop the Norvasc  5 mg for the time being.  Encouraged fluids throughout the day since he is on Lasix .   I will have his daughter send me a message early next week to let me know how he is doing and she is gena continue to monitor his blood pressure at home. - Basic Metabolic Panel; Future - Basic Metabolic Panel   Darleene Shape, NP

## 2023-12-03 NOTE — Progress Notes (Signed)
   12/03/2023  Patient ID: Jay [name], male   DOB: Jun 09, 1950, 73 y.o.   MRN: [id number]  Pharmacy Quality Measure Review  This patient is appearing on a report for being at risk of failing the adherence measure for cholesterol (statin) medications this calendar year.   Medication: Atorvastatin  Last fill date: 11/19/23 for 90 day supply  Insurance report was not up to date. No action needed at this time.   [name], PharmD Clinical Pharmacist [phone]

## 2023-12-03 NOTE — Transitions of Care (Post Inpatient/ED Visit) (Signed)
   12/03/2023  Name: Jay [name] MRN: [id number] DOB: 1950-05-14  Today's TOC FU Call Status: Today's TOC FU Call Status:: Unsuccessful Call (1st Attempt) Unsuccessful Call (1st Attempt) Date: 12/01/23  Attempted to reach the patient regarding the most recent Inpatient/ED visit.  Follow Up Plan: No further outreach attempts will be made at this time. We have been unable to contact the patient. Patient already seen in office Signature Julian Lemmings, LPN [facility] Nurse Health Advisor Direct Dial [phone]

## 2023-12-06 ENCOUNTER — Encounter: Payer: Self-pay | Admitting: Adult Health

## 2023-12-15 NOTE — Progress Notes (Unsigned)
 Cardiology Office Note    Date:  12/17/2023  ID:  Jay [name] 25-Apr-1950, MRN [id number] Cardiologist: Alvan Carrier, MD { : History of Present Illness:    Jay [name] is a 73 y.o. male with past medical history of paroxysmal atrial fibrillation, carotid artery stenosis, HTN, COPD and history of chest pain who presents to the office today for overdue 70-month follow-up and hospital follow-up.  He was last examined by Dr. Alvan in 11/2022 and reported occasional dizziness which would occur with standing and was consuming 5 cups of coffee a day with minimal water intake. He did report having occasional palpitations but no persistent symptoms. Also noted occasional episodes of chest pain which would occur approximately once per week and could occur at rest or with activity and worse with deep breathing. His chest pain was overall felt to be atypical for a cardiac etiology. Orthostatics were checked in clinic and his SBP dropped by 31 points, therefore he was encouraged to consume more water and juice. He was continued on Amlodipine  5 mg daily, Eliquis  5 mg twice daily, Atorvastatin  40 mg daily and Lopressor  50 mg twice daily.  Was informed to follow-up in 6 months but has not been evaluated by cardiology since.  In the interim, he was admitted to [facility] in 11/2023 for an acute CHF exacerbation. He responded well to IV Lasix  and was transitioned to Lasix  40 mg daily at the time of discharge and weight at that time was 91.9 kg. Echocardiogram during admission showed a preserved EF of 60 to 65% with normal RV function and trivial MR. Presented back to the ED the day after discharge for evaluation of worsening dizziness and received a 500 mL fluid bolus with improvement in symptoms. He did follow-up with his PCP on 12/03/2023 and was noted to be orthostatic, therefore Amlodipine  5 mg was discontinued.  In talking with the patient and his daughter, they report his activity has overall been  significantly limited for several years as he typically sits on the couch and is not active. He is still having occasional episodes of dizziness despite stopping Amlodipine  and dose reduction of Lasix  to 20 mg every other day. In reviewing his dietary intake, he reports only consuming 1 bottle of water a day as he dislikes water. His daughter has been trying to encourage him to consume more fluids. He denies any specific orthopnea, PND or pitting edema. No recent chest pain but does experience occasional palpitations. He was previously on Eliquis  but self-discontinued earlier this year due to cost.  Studies Reviewed:   EKG: EKG is not ordered today.   NST: 08/2021   The study is normal, there are no perfusion defects consistent with prior infarct or current ischemia. The study is intermediate risk purely based on LVEF 45%, consider echo to better evaluate LV function   No ST deviation was noted.   LV perfusion is normal.   Left ventricular function is abnormal. Nuclear stress EF: 45 %. The left ventricular ejection fraction is mildly decreased (45-54%). End diastolic cavity size is normal.   Echocardiogram: 11/2023 IMPRESSIONS     1. Left ventricular ejection fraction, by estimation, is 60 to 65%. The  left ventricle has normal function. Left ventricular endocardial border  not optimally defined to evaluate regional wall motion. Left ventricular  diastolic parameters are  indeterminate.   2. Right ventricular systolic function is normal. The right ventricular  size is normal. Tricuspid regurgitation signal is inadequate for assessing  PA pressure.   3. The mitral valve is normal in structure. Trivial mitral valve  regurgitation. No evidence of mitral stenosis.   4. The aortic valve was not well visualized.   5. Technically difficult study, limited visualization.    Risk Assessment/Calculations:    CHA2DS2-VASc Score = 4  This indicates a 4.8% annual risk of stroke. The patient's  score is based upon: CHF History: 1 HTN History: 1 Diabetes History: 0 Stroke History: 0 Vascular Disease History: 1 Age Score: 1 Gender Score: 0   Physical Exam:   VS:  BP 128/74   Pulse 60   Ht 5' 8.5 (1.74 m)   Wt 199 lb (90.3 kg)   SpO2 96%   BMI 29.82 kg/m    Wt Readings from Last 3 Encounters:  12/16/23 199 lb (90.3 kg)  12/01/23 202 lb (91.6 kg)  11/30/23 202 lb 9.6 oz (91.9 kg)     GEN: Pleasant, elderly male appearing in no acute distress NECK: No JVD; No carotid bruits CARDIAC: Irregularly irregular, no murmurs, rubs, gallops RESPIRATORY:  Clear to auscultation without rales, wheezing or rhonchi  ABDOMEN: Appears non-distended. No obvious abdominal masses. EXTREMITIES: No clubbing or cyanosis. No pitting edema.  Distal pedal pulses are 2+ bilaterally.   Assessment and Plan:   1. Persistent atrial fibrillation (HCC) - His heart rate is well-controlled in the 60's today and will continue Lopressor  50 mg twice daily for rate-control.  - He previously self discontinued Eliquis  due to cost. Indications for this were reviewed and he is open to us  sending this to his pharmacy to see what the co-pay would be. If unaffordable, will reach out to our pharmacy team to see what patient assistance options are available. Could also switch to Xarelto if needed. For now, will prescribe Eliquis  5 mg twice daily which is the appropriate dose given his current age (73 years old), weight (199 lbs) and renal function (creatinine at 1.42 when checked on 12/03/2023). If able to start Eliquis , would stop ASA 81mg  daily.   2. Chronic heart failure with preserved ejection fraction (HFpEF) (HCC) - There was concern for right heart failure based off CTA in 11/2023 but recent echocardiogram showed a preserved EF of 60 to 65% with normal RV function. As discussed below, Lasix  dosing and has been reduced given orthostatic hypotension and will further reduce to 10 mg every other day. Would not use  an SGLT2 inhibitor at this time given his variable PO intake.  3. Orthostatic hypotension - He remains orthostatic today with BP dropping from 119/64 to 92/64 when going from sitting to standing with associated dizziness at that time. As discussed above, he is only consuming 16 ounces of water a day. Reviewed the importance of adequate hydration but it is unclear if he is willing to increase his fluid intake. For now, will reduce Lasix  from 20 mg every other day to 10 mg every other day. I have asked his daughter to reach out and report on symptoms within the next few weeks. May need to stop Lasix  and only take as needed for weight gain greater than 2 pounds overnight, greater than 5 pounds in 1 week or for worsening lower extremity edema. Amlodipine  was recently discontinued and would remain off of that for now. Continue Lopressor  given his persistent atrial fibrillation and the need for rate-control.  4. Carotid artery plaque, bilateral - Most recent carotid dopplers in 05/2022 showed moderate plaque bilaterally with no hemodynamically significant stenosis.  Continue Atorvastatin  40  mg daily.  Signed, Laymon CHRISTELLA Qua, PA-C

## 2023-12-16 ENCOUNTER — Ambulatory Visit: Attending: Student | Admitting: Student

## 2023-12-16 ENCOUNTER — Telehealth: Payer: Self-pay

## 2023-12-16 ENCOUNTER — Encounter: Payer: Self-pay | Admitting: Student

## 2023-12-16 VITALS — BP 128/74 | HR 60 | Ht 68.5 in | Wt 199.0 lb

## 2023-12-16 DIAGNOSIS — I4819 Other persistent atrial fibrillation: Secondary | ICD-10-CM

## 2023-12-16 DIAGNOSIS — I951 Orthostatic hypotension: Secondary | ICD-10-CM

## 2023-12-16 DIAGNOSIS — I5032 Chronic diastolic (congestive) heart failure: Secondary | ICD-10-CM | POA: Diagnosis not present

## 2023-12-16 DIAGNOSIS — I6523 Occlusion and stenosis of bilateral carotid arteries: Secondary | ICD-10-CM

## 2023-12-16 MED ORDER — FUROSEMIDE 20 MG PO TABS: 10.0000 mg | ORAL_TABLET | ORAL | 1 refills | Status: AC

## 2023-12-16 MED ORDER — APIXABAN 5 MG PO TABS: 5.0000 mg | ORAL_TABLET | Freq: Two times a day (BID) | ORAL | 3 refills | Status: AC

## 2023-12-16 MED ORDER — APIXABAN 5 MG PO TABS: 5.0000 mg | ORAL_TABLET | Freq: Two times a day (BID) | ORAL | 3 refills | Status: DC

## 2023-12-16 NOTE — Telephone Encounter (Signed)
 Please refill pt's Eliquis  5mg  twice daily per provider. Pt was seen in office 12/16/23.

## 2023-12-16 NOTE — Patient Instructions (Signed)
 Medication Instructions:  Your physician has recommended you make the following change in your medication:   -Change Lasix  to 10 mg every other day.  -Restart Eliquis  5 mg twice daily   *If you need a refill on your cardiac medications before your next appointment, please call your pharmacy*  Lab Work: None If you have labs (blood work) drawn today and your tests are completely normal, you will receive your results only by: MyChart Message (if you have MyChart) OR A paper copy in the mail If you have any lab test that is abnormal or we need to change your treatment, we will call you to review the results.  Testing/Procedures: None  Follow-Up: At [facility], you and your health needs are our priority.  As part of our continuing mission to provide you with exceptional heart care, our providers are all part of one team.  This team includes your primary Cardiologist (physician) and Advanced Practice Providers or APPs (Physician Assistants and Nurse Practitioners) who all work together to provide you with the care you need, when you need it.  Your next appointment:   2-3 month(s)  Provider:   You may see Alvan Carrier, MD or one of the following Advanced Practice Providers on your designated Care Team:   Laymon Qua, PA-C  Scotesia Schaller, [place] Olivia Pavy, [place]     We recommend signing up for the patient portal called MyChart.  Sign up information is provided on this After Visit Summary.  MyChart is used to connect with patients for Virtual Visits (Telemedicine).  Patients are able to view lab/test results, encounter notes, upcoming appointments, etc.  Non-urgent messages can be sent to your provider as well.   To learn more about what you can do with MyChart, go to [email or web address].   Other Instructions

## 2023-12-16 NOTE — Addendum Note (Signed)
 Addended by: KENETH KNEE C on: 12/16/2023 03:03 PM   Modules accepted: Orders

## 2023-12-17 ENCOUNTER — Encounter: Payer: Self-pay | Admitting: Student

## 2023-12-20 ENCOUNTER — Telehealth: Payer: Self-pay | Admitting: Adult Health

## 2023-12-20 NOTE — Telephone Encounter (Unsigned)
 Copied from CRM #[id number]. Topic: Clinical - Medication Refill >> Dec 20, 2023 12:23 PM Ashley R wrote: Medication: Fluticasone-Umeclidin-Vilant (TRELEGY ELLIPTA ) 100-62.5-25 MCG/ACT AEPB  albuterol  (VENTOLIN  HFA) 108 (90 Base) MCG/ACT inhaler [[id number]] DISCONTINUED  Has the patient contacted their pharmacy? Yes  This is the patient's preferred pharmacy:  [facility] [address] [place] - [address] [place] [address] Phone: [phone] Fax: [phone]  Is this the correct pharmacy for this prescription? Yes  Has the prescription been filled recently? Yes  Is the patient out of the medication? Yes, lung provider suggests restarting.   Has the patient been seen for an appointment in the last year OR does the patient have an upcoming appointment? Yes  Can we respond through MyChart? Yes  Agent: Please be advised that Rx refills may take up to 3 business days. We ask that you follow-up with your pharmacy.

## 2023-12-21 ENCOUNTER — Other Ambulatory Visit: Payer: Self-pay | Admitting: Adult Health

## 2023-12-21 DIAGNOSIS — R2681 Unsteadiness on feet: Secondary | ICD-10-CM

## 2023-12-21 DIAGNOSIS — M545 Low back pain, unspecified: Secondary | ICD-10-CM

## 2023-12-21 NOTE — Telephone Encounter (Signed)
 Unable to pend Albuterol 

## 2023-12-22 NOTE — Therapy (Incomplete)
 OUTPATIENT PHYSICAL THERAPY THORACOLUMBAR EVALUATION   Patient Name: Jay [name] MRN: [id number] DOB:1950/07/17, 73 y.o., male Today's Date: 12/22/2023  END OF SESSION:   Past Medical History:  Diagnosis Date   Anxiety    Arthritis    Chronic kidney disease    COPD (chronic obstructive pulmonary disease) (HCC)    Depression    Dysrhythmia    Emphysema of lung (HCC)    GERD (gastroesophageal reflux disease)    Hypertension    PAF (paroxysmal atrial fibrillation) (HCC)    Shortness of breath    with excertion   Past Surgical History:  Procedure Laterality Date   APPENDECTOMY     COLONOSCOPY WITH PROPOFOL  N/A 03/25/2022   Procedure: COLONOSCOPY WITH PROPOFOL ;  Surgeon: Shaaron Lamar HERO, MD;  Location: [facility];  Service: Endoscopy;  Laterality: N/A;  9:00 am   EYE SURGERY     POLYPECTOMY  03/25/2022   Procedure: POLYPECTOMY INTESTINAL;  Surgeon: Shaaron Lamar HERO, MD;  Location: [facility];  Service: Endoscopy;;   Patient Active Problem List   Diagnosis Date Noted   Volume overload 11/29/2023   GAD (generalized anxiety disorder) 04/13/2022   MDD (major depressive disorder) 04/13/2022   DOE (dyspnea on exertion) 11/11/2021   Intracranial bleeding (HCC) 03/04/2021   Intracranial hemorrhage (HCC) 03/03/2021   Syncope and collapse 03/03/2021   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  ONSET DATE: ***  SUBJECTIVE:                                                                                                                                                                                           SUBJECTIVE STATEMENT: ***  PERTINENT HISTORY:  ***  PAIN:  Are you having pain?  {OPRCPAIN:27236}  PRECAUTIONS: {Therapy precautions:24002}  RED FLAGS: {PT Red Flags:29287}   WEIGHT BEARING RESTRICTIONS: {Yes ***/No:24003}  FALLS:  Has patient fallen in last 6 months? {fallsyesno:27318}  LIVING ENVIRONMENT: Lives with: {OPRC lives with:25569::lives with their family} Lives in: {Lives in:25570} Stairs: {opstairs:27293} Has following equipment at home: {Assistive devices:23999}  OCCUPATION: ***  PLOF: {PLOF:24004}  PATIENT GOALS: ***  NEXT MD VISIT: ***  OBJECTIVE:  Note: Objective measures were completed at Evaluation unless otherwise noted.  DIAGNOSTIC FINDINGS:  ***  PATIENT SURVEYS:  {rehab surveys:24030}  COGNITION: Overall cognitive status: {cognition:24006}     SENSATION: {sensation:27233}  MUSCLE LENGTH:  Hamstrings: Right *** deg; Left *** deg Debby test: Right *** deg; Left *** deg  POSTURE: {posture:25561}  PALPATION: ***  LUMBAR ROM:   AROM eval  Flexion   Extension   Right lateral flexion   Left lateral flexion   Right rotation   Left rotation    (Blank rows = not tested)  LOWER EXTREMITY ROM:     {AROM/PROM:27142}  Right eval Left eval  Hip flexion    Hip extension    Hip abduction    Hip adduction    Hip internal rotation    Hip external rotation    Knee flexion    Knee extension    Ankle dorsiflexion    Ankle plantarflexion    Ankle inversion    Ankle eversion     (Blank rows = not tested)  LOWER EXTREMITY MMT:    MMT Right eval Left eval  Hip flexion    Hip extension    Hip abduction    Hip adduction    Hip internal rotation    Hip external rotation    Knee flexion    Knee extension    Ankle dorsiflexion    Ankle plantarflexion    Ankle inversion    Ankle eversion     (Blank rows = not tested)  LUMBAR SPECIAL TESTS:  {lumbar special test:25242}  FUNCTIONAL TESTS:  {Functional tests:24029}  GAIT: Distance walked: *** Assistive device utilized: {Assistive  devices:23999} Level of assistance: {Levels of assistance:24026} Comments: ***  TREATMENT DATE: ***                                                                                                                                 PATIENT EDUCATION:  Education details: *** Person educated: {Person educated:25204} Education method: {Education Method:25205} Education comprehension: {Education Comprehension:25206}  HOME EXERCISE PROGRAM: ***  ASSESSMENT:  CLINICAL IMPRESSION: Patient is a *** y.o. *** who was seen today for physical therapy evaluation and treatment for ***.   OBJECTIVE IMPAIRMENTS: {opptimpairments:25111}.   ACTIVITY LIMITATIONS: {activitylimitations:27494}  PARTICIPATION LIMITATIONS: {participationrestrictions:25113}  PERSONAL FACTORS: {Personal factors:25162} are also affecting patient's functional outcome.   REHAB POTENTIAL: {rehabpotential:25112}  CLINICAL DECISION MAKING: {clinical decision making:25114}  EVALUATION COMPLEXITY: {Evaluation complexity:25115}   GOALS: Goals reviewed with patient? {yes/no:20286}  SHORT TERM GOALS: Target date: ***  *** Baseline: Goal status: INITIAL  2.  *** Baseline:  Goal status: INITIAL  3.  *** Baseline:  Goal status: INITIAL  4.  *** Baseline:  Goal status: INITIAL  5.  *** Baseline:  Goal status: INITIAL  6.  *** Baseline:  Goal status: INITIAL  LONG TERM GOALS: Target date: ***  *** Baseline:  Goal status: INITIAL  2.  *** Baseline:  Goal status: INITIAL  3.  *** Baseline:  Goal status: INITIAL  4.  *** Baseline:  Goal status: INITIAL  5.  *** Baseline:  Goal status: INITIAL  6.  *** Baseline:  Goal status:  INITIAL  PLAN:  PT FREQUENCY: {rehab frequency:25116}  PT DURATION: {rehab duration:25117}  PLANNED INTERVENTIONS: {rehab planned interventions:25118::97110-Therapeutic exercises,97530- Therapeutic [phone]- Neuromuscular re-education,97535- Self  Rjmz,02859- Manual therapy,Patient/Family education}.  PLAN FOR NEXT SESSION: ***   Greig GORMAN Quivers, PT 12/22/2023, 8:35 AM

## 2023-12-23 ENCOUNTER — Ambulatory Visit (HOSPITAL_COMMUNITY)

## 2023-12-23 IMAGING — CT CT HEAD W/O CM
3 of 4 series · 13 of 47 positions shown, 15 images · non-contrast
Comparison: March 03, 2021.

CLINICAL DATA: Follow-up on traumatic intracranial hemorrhage. On
aspirin.



[Series 2: head without · axial · non-contrast · 0.52mm/px · z∈[-96,+24]mm · 7 of 32 slices shown, 9 images]
[im 4/32  brain]
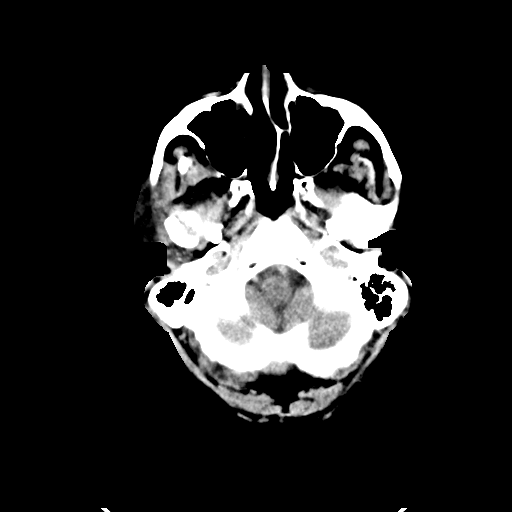
[im 4/32  bone]
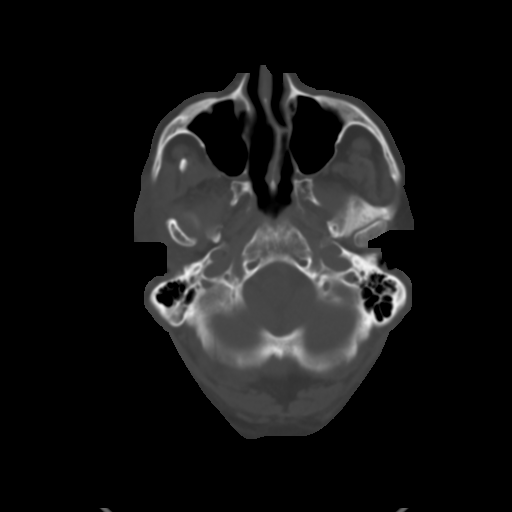
[im 8/32  brain]
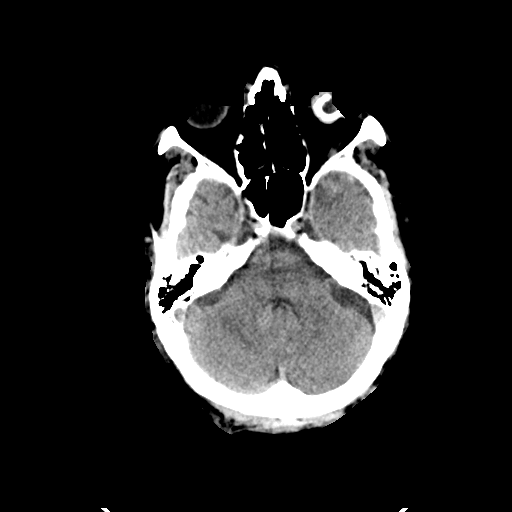
[im 12/32  brain]
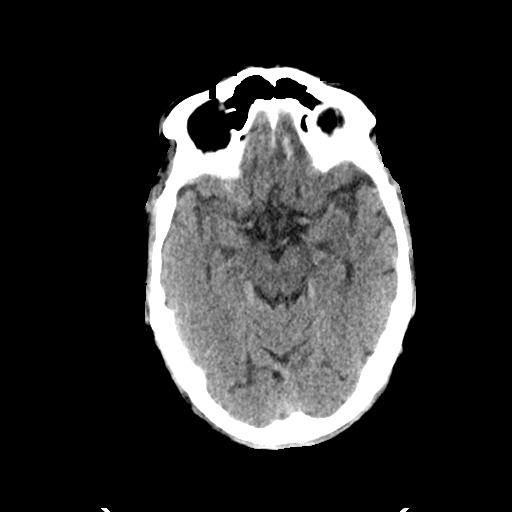
[im 16/32  brain]
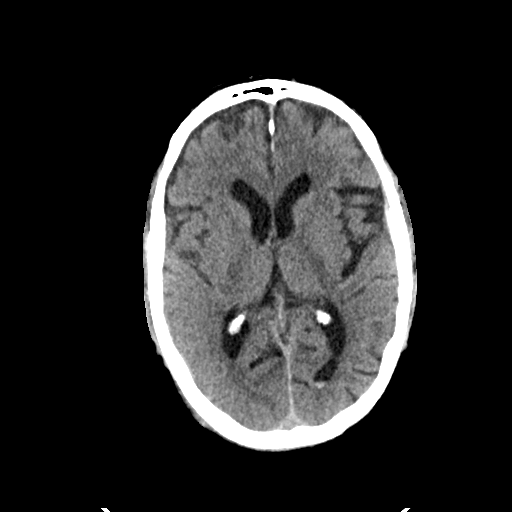
[im 20/32  brain]
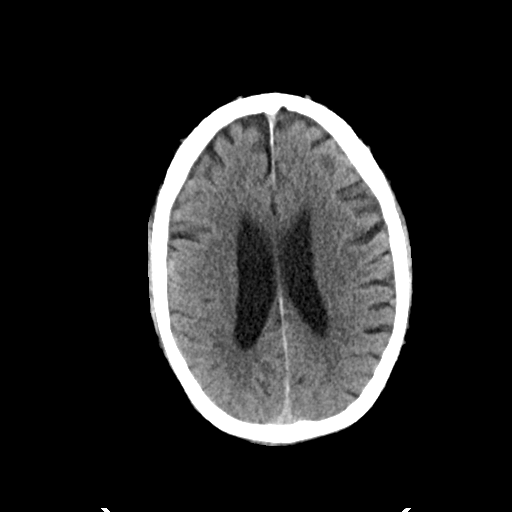
[im 20/32  bone]
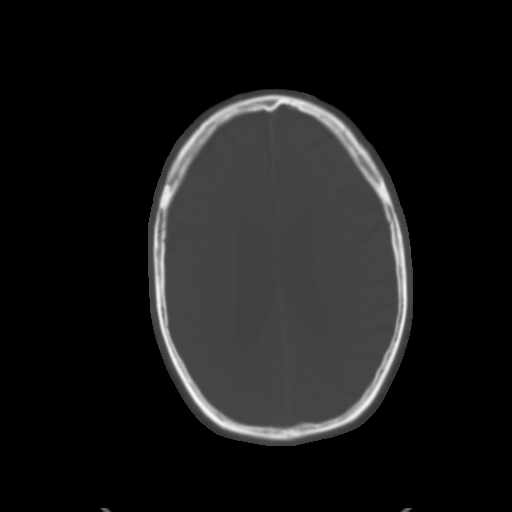
[im 24/32  brain]
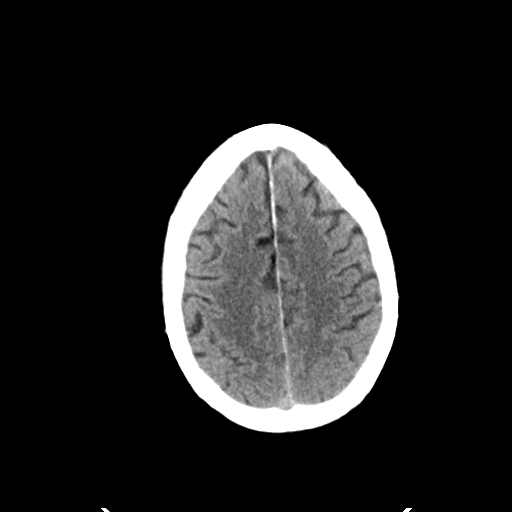
[im 28/32  brain]
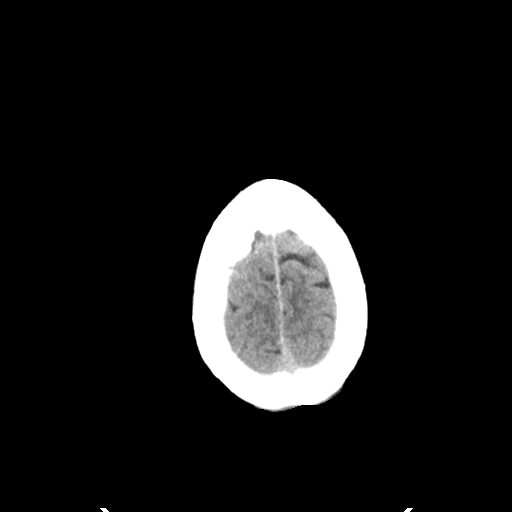

[Series 4: head without cor · coronal · non-contrast · 0.36mm/px · 3 of 72 slices shown]
[im 24/72  brain]
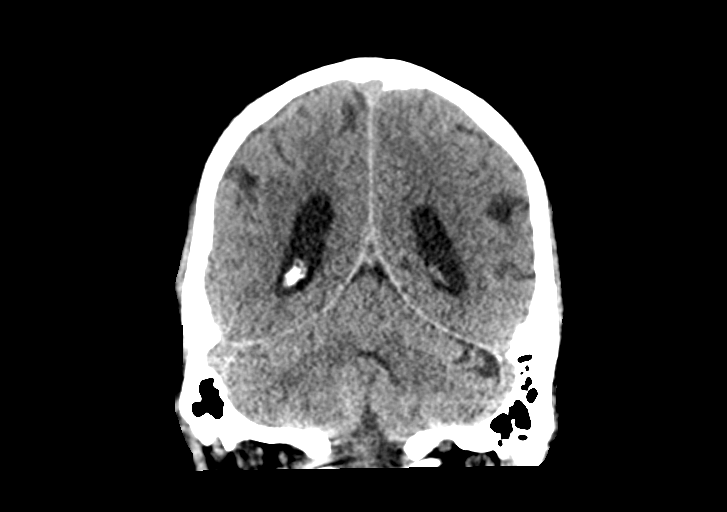
[im 32/72  brain]
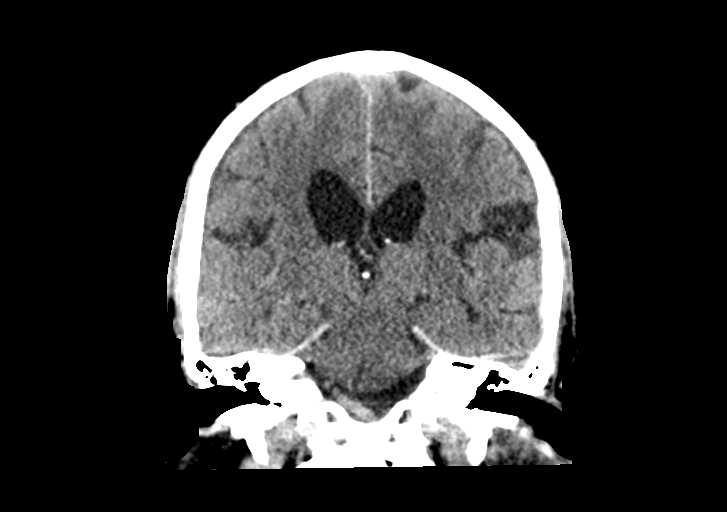
[im 40/72  brain]
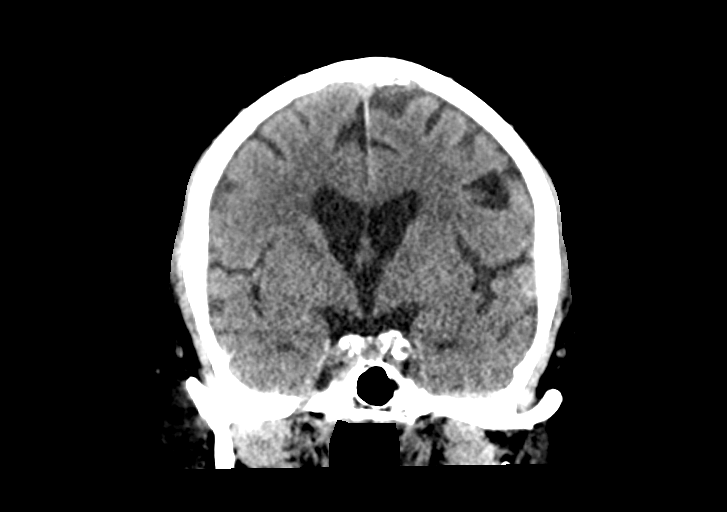

[Series 5: head without sag · sagittal · non-contrast · 0.33mm/px · 3 of 64 slices shown]
[im 22/64  brain]
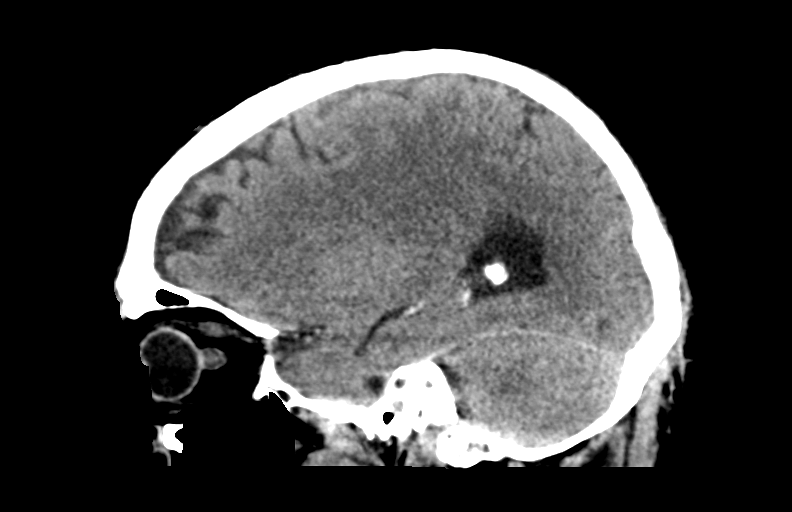
[im 32/64  brain]
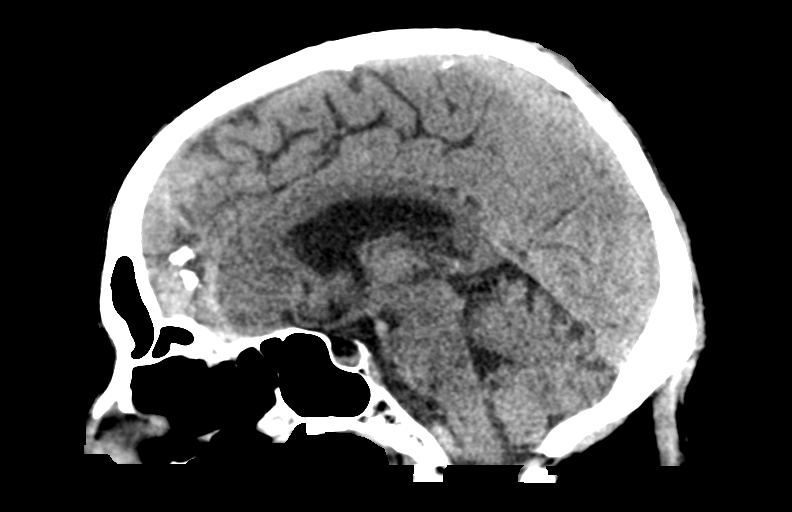
[im 43/64  brain]
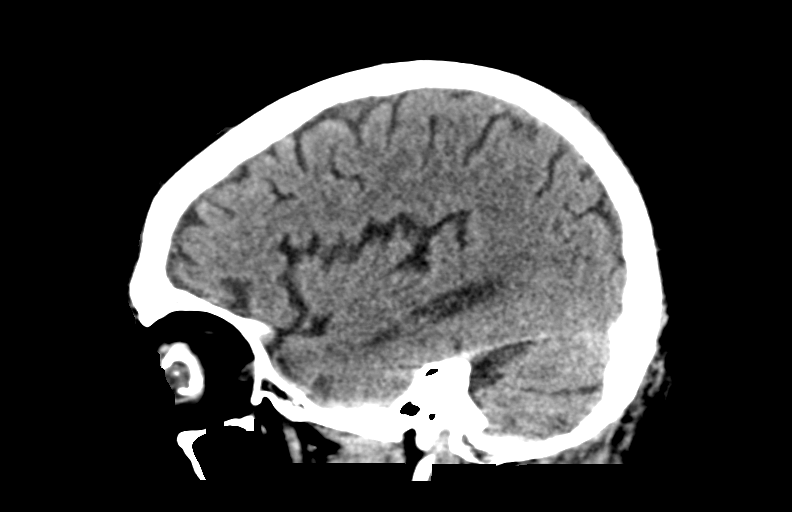

[13 of 47 positions shown; findings below may reference images not displayed]

FINDINGS: Brain: Small volume of layering acute hemorrhage in the occipital
horn of the left ventricle, slightly increased in
volume/conspicuity. Similar small volume of extra-axial hemorrhage
along the right falx anteriorly. Also, similar subarachnoid
hemorrhage along the inferior/anterior frontal lobes bilaterally
with possible small volume intraparenchymal hemorrhage in the
inferior left frontal lobe with associated suspected brain edema,
compatible with contusion. No hydrocephalus. No midline shift, mass
lesion, or evidence of acute large vascular territory infarct. Mild
patchy white matter hypoattenuation, nonspecific but compatible with
chronic microvascular ischemic disease.

Vascular: No hyperdense vessel identified.

Skull: No acute fracture.

Sinuses/Orbits: Mild ethmoid air cell mucosal thickening.
Redemonstrated nondisplaced right occipital bone fracture extending
inferiorly to the right skull base. Left pthisis bulbi.

Other: No mastoid effusions.
IMPRESSION: 1. Small volume of layering acute hemorrhage in the left lateral
ventricle, slightly increased in volume/conspicuity. No
hydrocephalus.
2. Similar appearance of suspected bilateral inferior frontal lobe
contusions with possible intraparenchymal hemorrhage inferiorly on
the left and overlying subarachnoid hemorrhage.
3. Similar small volume of extra-axial hemorrhage more superiorly
along the anterior right falx, probably subdural.
4. Redemonstrated nondisplaced right occipital bone fracture.

## 2023-12-24 NOTE — Telephone Encounter (Signed)
 This is filled by pulmonary. Tried to call pt to reach out to them looks like he needs an appt.

## 2023-12-25 ENCOUNTER — Other Ambulatory Visit: Payer: Self-pay | Admitting: Adult Health

## 2023-12-27 NOTE — Progress Notes (Deleted)
 @Patient  ID: Jay [name], male    DOB: [birth year], 73 y.o.   MRN: [id number]  No chief complaint on file.   Referring provider: [name], NP  HPI: 73 year old male, former smoker. PMH significant for HTN, COPD, depression/anxiety. Patient of Dr. Jude, seen for initial consult for dyspnea on 11/11/21.  Previous [facility] pulmonary encounter:  12/30/2021 Patient presents today for follow-up for dyspnea with PFTs testing. Accompanied by his wife today. He reports having a hard time breathing sometimes. He runs out of breath easily with exertion or when bending over. Associated wheezing at night and dry cough. He used Trelegy sample and did notice some improvement in symptoms with use. He had a sleep study in the past that was negative, he snores some but has not overt concerns about sleep apnea.    11/18/2023  Discussed the use of AI scribe software for clinical note transcription with the patient, who gave verbal consent to proceed.  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 He is accompanied by his daughter.  He has been experiencing worsening shortness of breath over the past six months, particularly with exertion, such as walking up hills or stairs, where he rates it a 'five' on a scale of zero to five. At rest, the shortness of breath is less severe. He also has a cough rated as a 'three' on a scale of zero to five, with occasional phlegm and chest tightness. No chest pain or hemoptysis is reported. His energy levels are low, and his sleep is disrupted due to restlessness, not directly related to his lung condition. Despite these issues, he feels confident leaving home.  His medical history includes COPD and emphysema, with previous pulmonary function tests in 2023 indicating a mild obstructive defect and moderate diffusion defect. A CT scan from 2023 was performed to evaluate his lungs. He  was previously on Trelegy inhaler but discontinued it due to cost. Currently, he uses only a rescue inhaler.  Approximately 1.5 to 2 years ago, he sustained a skull fracture with bleeding from a fall, leading to a decline in his overall condition, including worsening breathing and reduced mobility. He has not participated in physical therapy and has an upcoming appointment in January to evaluate the skull fracture.  He has a history of atrial fibrillation and is supposed to be on Eliquis , but there is uncertainty about his current use due to cost. He is also on metoprolol  for heart rate control. No significant mucus production, occasional wheezing, and no leg swelling are reported. There is no recent history of chest pain or hemoptysis.  an Assessment & Plan Chronic obstructive pulmonary disease (COPD) with emphysema Worsening dyspnea, particularly with exertion, and a cough rated 3/5. Oxygen saturation is 97% RA. No wheezing or ronchi on auscultation. Previous CT scan in 2023 showed emphysema and bronchial thickening. Not on a maintenance inhaler due to cost issues with Trelegy. Current inhaler is a rescue inhaler, providing short-term relief. - Provided samples of Trelegy 100mcg inhaler and apply for patient assistance  - Coordinated with pharmacy team to find an alternative inhaler covered by insurance. - Scheduled follow-up in 4-6 weeks to reassess symptoms and inhaler efficacy.  Shortness of breath - Checking CXR along with CBC, BMET and D-DIMER  Atrial fibrillation Irregular heart rate of 123 bpm. Unsure if patient is on blood thinner, he should be on Eliquis  5mg  twice daily.  No chest pain or hemoptysis. Elevated heart rate may contribute to fatigue. Blood pressure is 126/66 mmHg. - Confirm if currently taking Eliquis  and adjust treatment if necessary. - Advised follow-up with cardiologist to assess and potentially adjust cardiac medications.  Fatigue Severe fatigue, rated as the worst  it has ever been. Likely multifactorial, potentially related to atrial fibrillation and lack of maintenance inhaler for COPD. - Address atrial fibrillation and COPD management as outlined above.    12/28/2023- Interim hx  Discussed the use of AI scribe software for clinical note transcription with the patient, who gave verbal consent to proceed.  History of Present Illness       Pulmonary function testing: 12/30/2021 >> FVC 3.14 (77%), FEV1 2.30 (77%), ratio 73, TLC 93%, DLCOunc 15.66 (64%)  Imaging: 12/17/21 HRCT >> No evidence of fibrotic interstitial lung disease. Bland appearing, bandlike scarring of the bilateral lung bases, right-greater-than-left. Emphysema and diffuse bilateral bronchial wall thickening.  Cardiac testing: 11/27/21 Echocardiogram >> EF 55-60%, unable to evaluate diastolic function     Allergies[1]  Immunization History  Administered Date(s) Administered   INFLUENZA, HIGH DOSE SEASONAL PF 03/18/2015, 10/23/2015, 10/30/2016, 11/29/2017, 12/07/2018, 11/21/2019, 12/03/2020, 11/19/2023   Influenza,inj,quad, With Preservative 01/15/2014   Influenza-Unspecified 09/11/2021   PFIZER(Purple Top)SARS-COV-2 Vaccination 08/31/2019, 09/21/2019   Pneumococcal Conjugate-13 03/18/2015   Pneumococcal Polysaccharide-23 05/26/2011, 11/21/2019   Pneumococcal-Unspecified 09/11/2021   Tdap 05/26/2011    Past Medical History:  Diagnosis Date   Anxiety    Arthritis    Chronic kidney disease    COPD (chronic obstructive pulmonary disease) (HCC)    Depression    Dysrhythmia    Emphysema of lung (HCC)    GERD (gastroesophageal reflux disease)    Hypertension    PAF (paroxysmal atrial fibrillation) (HCC)    Shortness of breath    with excertion    Tobacco History: Tobacco Use History[2] Counseling given: Not Answered   Outpatient Medications Prior to Visit  Medication Sig Dispense Refill   acetaminophen  (TYLENOL ) 500 MG tablet Take 1,000 mg by mouth every 6  (six) hours as needed for moderate pain.     apixaban  (ELIQUIS ) 5 MG TABS tablet Take 1 tablet (5 mg total) by mouth 2 (two) times daily. 60 tablet 3   aspirin  81 MG chewable tablet Chew 81 mg by mouth 2 (two) times daily.     atorvastatin  (LIPITOR) 40 MG tablet Take 1 tablet (40 mg total) by mouth daily. 90 tablet 3   buPROPion  (WELLBUTRIN  XL) 150 MG 24 hr tablet Take 3 tablets (450 mg total) by mouth every morning. 90 tablet 2   donepezil  (ARICEPT ) 10 MG tablet Take 1 tablet (10 mg total) by mouth at bedtime. 90 tablet 3   DULoxetine  (CYMBALTA ) 60 MG capsule Take 2 capsules (120 mg total) by mouth every evening. (Patient taking differently: Take 60 mg by mouth 2 (two) times daily.) 180 capsule 0   esomeprazole  (NEXIUM ) 40 MG capsule Take 1 capsule (40 mg total) by mouth every morning. 90 capsule 3   famotidine  (PEPCID ) 20 MG tablet Take 1 tablet by mouth once daily 90 tablet 0   finasteride  (PROSCAR ) 5 MG tablet Take 1 tablet by mouth once daily 90 tablet 0   Fluticasone-Umeclidin-Vilant (TRELEGY ELLIPTA ) 100-62.5-25 MCG/ACT AEPB INHALE 1 PUFF INTO LUNGS ONCE DAILY 60 each 0   furosemide  (LASIX ) 20 MG tablet Take 0.5 tablets (10 mg total) by mouth every other day. 15 tablet 1   lurasidone  (LATUDA ) 40 MG TABS tablet Take 1  tablet (40 mg total) by mouth at bedtime. 90 tablet 0   metoprolol  tartrate (LOPRESSOR ) 50 MG tablet Take 1 tablet (50 mg total) by mouth 2 (two) times daily. 90 tablet 3   tamsulosin  (FLOMAX ) 0.4 MG CAPS capsule Take 1 capsule (0.4 mg total) by mouth daily. 90 capsule 3   traMADol  (ULTRAM ) 50 MG tablet Take 1 tablet (50 mg total) by mouth every 6 (six) hours as needed for severe pain (pain score 7-10). 30 tablet 2   No facility-administered medications prior to visit.      Review of Systems  Review of Systems   Physical Exam  There were no vitals taken for this visit. Physical Exam  ***  Lab Results:  CBC    Component Value Date/Time   WBC 7.7 11/29/2023  1346   RBC 4.60 11/29/2023 1346   HGB 15.0 12/01/2023 2044   HCT 44.0 12/01/2023 2044   PLT 231 11/29/2023 1346   MCV 93.5 11/29/2023 1346   MCH 30.4 11/29/2023 1346   MCHC 32.6 11/29/2023 1346   RDW 14.0 11/29/2023 1346   LYMPHSABS 1.1 11/18/2023 1042   MONOABS 0.6 11/18/2023 1042   EOSABS 0.1 11/18/2023 1042   BASOSABS 0.1 11/18/2023 1042    BMET    Component Value Date/Time   NA 137 12/03/2023 1104   K 4.1 12/03/2023 1104   CL 102 12/03/2023 1104   CO2 25 12/03/2023 1104   GLUCOSE 147 (H) 12/03/2023 1104   BUN 33 (H) 12/03/2023 1104   CREATININE 1.42 12/03/2023 1104   CREATININE 1.31 (H) 11/19/2023 1536   CALCIUM  8.9 12/03/2023 1104   GFRNONAA 57 (L) 11/30/2023 0420    BNP    Component Value Date/Time   BNP 175.0 (H) 10/03/2021 1329    ProBNP    Component Value Date/Time   PROBNP 1,420.0 (H) 11/29/2023 1425    Imaging: CT Head Wo Contrast Result Date: 12/01/2023 EXAM: CT HEAD AND CERVICAL SPINE 12/01/2023 05:18:58 PM TECHNIQUE: CT of the head and cervical spine was performed without the administration of intravenous contrast. Multiplanar reformatted images are provided for review. Automated exposure control, iterative reconstruction, and/or weight based adjustment of the mA/kV was utilized to reduce the radiation dose to as low as reasonably achievable. COMPARISON: CT head and c-spine 03/03/2021 CLINICAL HISTORY: Head trauma, intracranial arterial injury suspected. FINDINGS: CT HEAD BRAIN AND VENTRICLES: Nonspecific punctate hyperdensity along the right frontal lobe (series 2, image 50) likely represents a cavernoma or artifact. No definite traumatic finding. No acute intracranial hemorrhage. No mass effect or midline shift. No abnormal extra-axial fluid collection. No evidence of acute infarct. No hydrocephalus. 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 SINUSES AND MASTOIDS: No acute abnormality. SOFT TISSUES AND SKULL: Atherosclerotic calcifications are present within the  cavernous internal carotid arteries. No acute skull fracture. No acute soft tissue abnormality. 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 No severe osseous central canal stenosis. No acute fracture or traumatic malalignment. DEGENERATIVE CHANGES: Multilevel severe degenerative changes of the spine with associated multilevel severe osseous neural foraminal stenosis. No severe osseous central canal stenosis. SOFT TISSUES: Paraseptal emphysematous changes. No prevertebral soft tissue swelling. IMPRESSION: 1. No definite acute intracranial abnormality. Nonspecific punctate hyperdensity in the right frontal lobe parenchyma, likely representing a cavernoma or artifact. 2. No acute fracture or traumatic listhesis of the cervical spine. 3. Multilevel severe degenerative changes of the spine with associated multilevel severe osseous neural  foraminal stenosis. Electronically signed by: Morgane Naveau MD 12/01/2023 05:30 PM EST RP Workstation: [id number]   CT Cervical Spine Wo Contrast Result Date: 12/01/2023 EXAM: CT HEAD AND CERVICAL SPINE 12/01/2023 05:18:58 PM TECHNIQUE: CT of the head and cervical spine was performed without the administration of intravenous contrast. Multiplanar reformatted images are provided for review. Automated exposure control, iterative reconstruction, and/or weight based adjustment of the mA/kV was utilized to reduce the radiation dose to as low as reasonably achievable. COMPARISON: CT head and c-spine 03/03/2021 CLINICAL HISTORY: Head trauma, intracranial arterial injury suspected. FINDINGS: CT HEAD BRAIN AND VENTRICLES: Nonspecific punctate hyperdensity along the right frontal lobe (series 2, image 50) likely represents a cavernoma or artifact. No definite traumatic finding. No acute intracranial hemorrhage. No mass effect or midline shift. No abnormal extra-axial fluid  collection. No evidence of acute infarct. No hydrocephalus. 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 SINUSES AND MASTOIDS: No acute abnormality. SOFT TISSUES AND SKULL: Atherosclerotic calcifications are present within the cavernous internal carotid arteries. No acute skull fracture. No acute soft tissue abnormality. 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 No severe osseous central canal stenosis. No acute fracture or traumatic malalignment. DEGENERATIVE CHANGES: Multilevel severe degenerative changes of the spine with associated multilevel severe osseous neural foraminal stenosis. No severe osseous central canal stenosis. SOFT TISSUES: Paraseptal emphysematous changes. No prevertebral soft tissue swelling. IMPRESSION: 1. No definite acute intracranial abnormality. Nonspecific punctate hyperdensity in the right frontal lobe parenchyma, likely representing a cavernoma or artifact. 2. No acute fracture or traumatic listhesis of the cervical spine. 3. 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 Electronically signed by: Morgane Naveau MD 12/01/2023 05:30 PM EST RP Workstation: [id number]   ECHOCARDIOGRAM COMPLETE Result Date: 11/30/2023    ECHOCARDIOGRAM REPORT   Patient Name:   MAKARI PORTMAN Date of Exam: 11/30/2023 Medical Rec #:  [id number]       Height:       68.5 in Accession #:    [id number]      Weight:       202.6 lb Date of Birth:  [birth year]       BSA:          2.066 m Patient Age:    73 years        BP:           114/78 mmHg Patient Gender: M               HR:           97 bpm. Exam Location:  [facility] Procedure: 2D Echo, Cardiac Doppler and Color Doppler (Both Spectral and Color            Flow Doppler were utilized during procedure). Indications:    CHF I50.9  History:        Patient has prior history of  Echocardiogram examinations, most                 recent 11/27/2021. COPD, Arrythmias:Atrial Fibrillation; Risk                 Factors:Hypertension.  Sonographer:    Tinnie Gosling RDCS Referring Phys: [phone] EJIROGHENE E EMOKPAE IMPRESSIONS  1. Left ventricular ejection fraction, by estimation, is 60 to 65%. The left ventricle has normal function. Left ventricular endocardial border not optimally defined to evaluate regional wall motion. Left ventricular diastolic  parameters are indeterminate.  2. Right ventricular systolic function is normal. The right ventricular size is normal. Tricuspid regurgitation signal is inadequate for assessing PA pressure.  3. The mitral valve is normal in structure. Trivial mitral valve regurgitation. No evidence of mitral stenosis.  4. The aortic valve was not well visualized.  5. Technically difficult study, limited visualization. FINDINGS  Left Ventricle: Left ventricular ejection fraction, by estimation, is 60 to 65%. The left ventricle has normal function. Left ventricular endocardial border not optimally defined to evaluate regional wall motion. The left ventricular internal cavity size was normal in size. Left ventricular diastolic parameters are indeterminate. Right Ventricle: The right ventricular size is normal. Right vetricular wall thickness was not well visualized. Right ventricular systolic function is normal. Tricuspid regurgitation signal is inadequate for assessing PA pressure. Left Atrium: Left atrial size was normal in size. Right Atrium: Right atrial size was not well visualized. Pericardium: The pericardium was not well visualized. Mitral Valve: The mitral valve is normal in structure. There is mild thickening of the mitral valve leaflet(s). There is mild calcification of the mitral valve leaflet(s). Mild mitral annular calcification. Trivial mitral valve regurgitation. No evidence  of mitral valve stenosis. Tricuspid Valve: The tricuspid valve is not well  visualized. Tricuspid valve regurgitation is not demonstrated. No evidence of tricuspid stenosis. Aortic Valve: The aortic valve was not well visualized. Aortic valve mean gradient measures 3.1 mmHg. Aortic valve peak gradient measures 5.8 mmHg. Pulmonic Valve: The pulmonic valve was not well visualized. Aorta: The aortic root was not well visualized and the ascending aorta was not well visualized. Venous: The inferior vena cava was not well visualized. IAS/Shunts: The interatrial septum was not well visualized.  IVC IVC diam: 1.60 cm LEFT ATRIUM           Index LA Vol (A4C): 63.6 ml 30.78 ml/m  AORTIC VALVE AV Vmax:           120.63 cm/s AV Vmean:          82.838 cm/s AV VTI:            0.178 m AV Peak Grad:      5.8 mmHg AV Mean Grad:      3.1 mmHg LVOT Vmax:         95.00 cm/s LVOT Vmean:        56.500 cm/s LVOT VTI:          0.144 m LVOT/AV VTI ratio: 0.81  SHUNTS Systemic VTI: 0.14 m Dorn Ross MD Electronically signed by Dorn Ross MD Signature Date/Time: 11/30/2023/2:33:09 PM    Final    CT ABDOMEN PELVIS W CONTRAST Result Date: 11/29/2023 EXAM: CT ABDOMEN AND PELVIS WITH CONTRAST 11/29/2023 04:17:46 PM TECHNIQUE: CT of the abdomen and pelvis was performed with the administration of 100 mL iohexol  (OMNIPAQUE ) 300 MG/ML solution. Multiplanar reformatted images are provided for review. Automated exposure control, iterative reconstruction, and/or weight-based adjustment of the mA/kV was utilized to reduce the radiation dose to as low as reasonably achievable. COMPARISON: None available. CLINICAL HISTORY: Abdominal pain, acute, nonlocalized. FINDINGS: LOWER CHEST: No acute abnormality. LIVER: The liver is unremarkable. GALLBLADDER AND BILE DUCTS: Normal gallbladder and biliary tract. SPLEEN: No acute abnormality. PANCREAS: No acute abnormality. ADRENAL GLANDS: No acute abnormality. KIDNEYS, URETERS AND BLADDER: No stones in the kidneys or ureters. No hydronephrosis. No perinephric or periureteral  stranding. Urinary bladder is unremarkable. GI AND BOWEL: Stomach demonstrates no acute abnormality. There is no bowel obstruction. Appendix not identified but  no secondary signs of appendicitis. PERITONEUM AND RETROPERITONEUM: No ascites. No free air. VASCULATURE: Vascular calcifications of the aorta. LYMPH NODES: No lymphadenopathy. REPRODUCTIVE ORGANS: No acute abnormality. BONES AND SOFT TISSUES: No acute osseous abnormality. No focal soft tissue abnormality. IMPRESSION: 1. No acute findings in the abdomen or pelvis. Electronically signed by: Norleen Boxer MD 11/29/2023 05:09 PM EST RP Workstation: [id number]   DG Chest 2 View Result Date: 11/29/2023 EXAM: 2 VIEW(S) XRAY OF THE CHEST 11/29/2023 01:03:00 PM COMPARISON: 11/18/2023 and CT of 11/23/2023. CLINICAL HISTORY: CHF, SOB. FINDINGS: LUNGS AND PLEURA: Moderate hyperinflation. Bibasilar scarring. Chronic pulmonary interstitial thickening / coarsening. No pleural effusion. No pneumothorax. HEART AND MEDIASTINUM: Patient rotated right into the frontal. Mild cardiomegaly. BONES AND SOFT TISSUES: No acute osseous abnormality. IMPRESSION: 1. Hyperinflation and cardiomegaly with bibasilar scarring. 2. Electronically signed by: Rockey Kilts MD 11/29/2023 01:57 PM EST RP Workstation: [id number]     Assessment & Plan:   No problem-specific Assessment & Plan notes found for this encounter.   There are no diagnoses linked to this encounter.  Assessment and Plan Assessment & Plan       I personally spent a total of *** minutes in the care of the patient today including {Time Based Coding:[id number]}.   Almarie LELON Ferrari, NP 12/27/2023     [1] No Known Allergies [2]  Social History Tobacco Use  Smoking Status Former   Current packs/day: 0.00   Types: Cigarettes   Quit date: 01/12/1993   Years since quitting: 30.9  Smokeless Tobacco Never

## 2023-12-28 ENCOUNTER — Ambulatory Visit: Admitting: Primary Care

## 2023-12-29 NOTE — Telephone Encounter (Signed)
°  The original prescription was discontinued on 12/01/2023 by Ward, Angelica, CPhT for the following reason: Patient Preference. Renewing this prescription may not be appropriate

## 2023-12-30 ENCOUNTER — Telehealth: Payer: Self-pay | Admitting: Primary Care

## 2023-12-30 ENCOUNTER — Ambulatory Visit: Admitting: Primary Care

## 2023-12-30 ENCOUNTER — Encounter: Payer: Self-pay | Admitting: Primary Care

## 2023-12-30 ENCOUNTER — Other Ambulatory Visit (HOSPITAL_COMMUNITY): Payer: Self-pay

## 2023-12-30 VITALS — BP 124/68 | HR 94 | Temp 97.5°F | Ht 69.0 in | Wt 199.6 lb

## 2023-12-30 DIAGNOSIS — J439 Emphysema, unspecified: Secondary | ICD-10-CM

## 2023-12-30 DIAGNOSIS — Z87891 Personal history of nicotine dependence: Secondary | ICD-10-CM | POA: Diagnosis not present

## 2023-12-30 DIAGNOSIS — J449 Chronic obstructive pulmonary disease, unspecified: Secondary | ICD-10-CM

## 2023-12-30 MED ORDER — STIOLTO RESPIMAT 2.5-2.5 MCG/ACT IN AERS: 2.0000 | INHALATION_SPRAY | Freq: Every day | RESPIRATORY_TRACT | Status: AC

## 2023-12-30 NOTE — Telephone Encounter (Signed)
 Can you do benefits investigation for Stiolto, Anoro and Trelegy

## 2023-12-30 NOTE — Progress Notes (Unsigned)
 @Patient  ID: Jay [name], male    DOB: Jan 02, 1951, 73 y.o.   MRN: [id number]  Chief Complaint  Patient presents with   COPD    Pt states symptoms have been stable. No worsening issues     Referring provider: [name], NP  HPI: Discussed the use of AI scribe software for clinical note transcription with the patient, who gave verbal consent to proceed.  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  He experiences ongoing difficulty breathing, particularly during physical activities, and becomes easily out of breath. This has been a persistent issue without significant recent changes. He has not participated in physical therapy or rehabilitation for this condition.  He was previously given samples of Trelegy for his COPD and emphysema, which he found helpful, but he ran out of it approximately three weeks ago due to cost issues. He is unsure if his wife completed the patient assistance paperwork. The cost of the medication is approximately ninety dollars. He is currently not on a maintenance inhaler due to cost constraints.  A CT scan in 2023 showed emphysema and bronchial thickening. A breathing test in 2023 indicated mild obstructive airway disease. He rates his cough and phlegm as a 'two', chest tightness as a 'four', and breathlessness when walking up a hill or stairs as a 'five'. He is limited in his ability to perform activities at home and sometimes feels nervous about leaving home due to breathing difficulties. He rates his sleep disturbance due to breathing issues as a 'three' and his energy level as a 'five'.  He does not use oxygen and is unaware of any previous oxygen level checks.  He has a history of atrial fibrillation and is on Lopressor  50 mg twice a day for rate control. He previously discontinued Eliquis  due to cost, but his wife was able to obtain it through insurance. He takes  Lasix , half a tablet every other day, for fluid management. He reports minimal swelling in his legs and no other areas of swelling.     Allergies[1]  Immunization History  Administered Date(s) Administered   INFLUENZA, HIGH DOSE SEASONAL PF 03/18/2015, 10/23/2015, 10/30/2016, 11/29/2017, 12/07/2018, 11/21/2019, 12/03/2020, 11/19/2023   Influenza,inj,quad, With Preservative 01/15/2014   Influenza-Unspecified 09/11/2021   PFIZER(Purple Top)SARS-COV-2 Vaccination 08/31/2019, 09/21/2019   Pneumococcal Conjugate-13 03/18/2015   Pneumococcal Polysaccharide-23 05/26/2011, 11/21/2019   Pneumococcal-Unspecified 09/11/2021   Tdap 05/26/2011    Past Medical History:  Diagnosis Date   Anxiety    Arthritis    Chronic kidney disease    COPD (chronic obstructive pulmonary disease) (HCC)    Depression    Dysrhythmia    Emphysema of lung (HCC)    GERD (gastroesophageal reflux disease)    Hypertension    PAF (paroxysmal atrial fibrillation) (HCC)    Shortness of breath    with excertion    Tobacco History: Tobacco Use History[2] Counseling given: Not Answered   Outpatient Medications Prior to Visit  Medication Sig Dispense Refill   acetaminophen  (TYLENOL ) 500 MG tablet Take 1,000 mg by mouth every 6 (six) hours as needed for moderate pain.     apixaban  (ELIQUIS ) 5 MG TABS tablet Take 1 tablet (5 mg total) by mouth 2 (two) times daily. 60 tablet 3   aspirin  81 MG chewable tablet Chew 81 mg by mouth 2 (two) times daily.     atorvastatin  (LIPITOR) 40 MG tablet Take 1 tablet (40 mg  total) by mouth daily. 90 tablet 3   buPROPion  (WELLBUTRIN  XL) 150 MG 24 hr tablet Take 3 tablets (450 mg total) by mouth every morning. 90 tablet 2   donepezil  (ARICEPT ) 10 MG tablet Take 1 tablet (10 mg total) by mouth at bedtime. 90 tablet 3   DULoxetine  (CYMBALTA ) 60 MG capsule Take 2 capsules (120 mg total) by mouth every evening. (Patient taking differently: Take 60 mg by mouth 2 (two) times daily.) 180  capsule 0   esomeprazole  (NEXIUM ) 40 MG capsule Take 1 capsule (40 mg total) by mouth every morning. 90 capsule 3   famotidine  (PEPCID ) 20 MG tablet Take 1 tablet by mouth once daily 90 tablet 0   finasteride  (PROSCAR ) 5 MG tablet Take 1 tablet by mouth once daily 90 tablet 0   Fluticasone-Umeclidin-Vilant (TRELEGY ELLIPTA ) 100-62.5-25 MCG/ACT AEPB INHALE 1 PUFF INTO LUNGS ONCE DAILY 60 each 0   furosemide  (LASIX ) 20 MG tablet Take 0.5 tablets (10 mg total) by mouth every other day. 15 tablet 1   lurasidone  (LATUDA ) 40 MG TABS tablet Take 1 tablet (40 mg total) by mouth at bedtime. 90 tablet 0   metoprolol  tartrate (LOPRESSOR ) 50 MG tablet Take 1 tablet (50 mg total) by mouth 2 (two) times daily. 90 tablet 3   tamsulosin  (FLOMAX ) 0.4 MG CAPS capsule Take 1 capsule (0.4 mg total) by mouth daily. 90 capsule 3   traMADol  (ULTRAM ) 50 MG tablet Take 1 tablet (50 mg total) by mouth every 6 (six) hours as needed for severe pain (pain score 7-10). 30 tablet 2   No facility-administered medications prior to visit.      Review of Systems  Review of Systems   Physical Exam  BP 124/68   Pulse 61   Temp (!) 97.5 F (36.4 C)   Ht 5' 9 (1.753 m) Comment: pt stated  Wt 199 lb 9.6 oz (90.5 kg)   SpO2 95% Comment: ra  BMI 29.48 kg/m  Physical Exam  ***  Lab Results:  CBC    Component Value Date/Time   WBC 7.7 11/29/2023 1346   RBC 4.60 11/29/2023 1346   HGB 15.0 12/01/2023 2044   HCT 44.0 12/01/2023 2044   PLT 231 11/29/2023 1346   MCV 93.5 11/29/2023 1346   MCH 30.4 11/29/2023 1346   MCHC 32.6 11/29/2023 1346   RDW 14.0 11/29/2023 1346   LYMPHSABS 1.1 11/18/2023 1042   MONOABS 0.6 11/18/2023 1042   EOSABS 0.1 11/18/2023 1042   BASOSABS 0.1 11/18/2023 1042    BMET    Component Value Date/Time   NA 137 12/03/2023 1104   K 4.1 12/03/2023 1104   CL 102 12/03/2023 1104   CO2 25 12/03/2023 1104   GLUCOSE 147 (H) 12/03/2023 1104   BUN 33 (H) 12/03/2023 1104   CREATININE 1.42  12/03/2023 1104   CREATININE 1.31 (H) 11/19/2023 1536   CALCIUM  8.9 12/03/2023 1104   GFRNONAA 57 (L) 11/30/2023 0420    BNP    Component Value Date/Time   BNP 175.0 (H) 10/03/2021 1329    ProBNP    Component Value Date/Time   PROBNP 1,420.0 (H) 11/29/2023 1425    Imaging: CT Head Wo Contrast Result Date: 12/01/2023 EXAM: CT HEAD AND CERVICAL SPINE 12/01/2023 05:18:58 PM TECHNIQUE: CT of the head and cervical spine was performed without the administration of intravenous contrast. Multiplanar reformatted images are provided for review. Automated exposure control, iterative reconstruction, and/or weight based adjustment of the mA/kV was utilized to reduce the  radiation dose to as low as reasonably achievable. COMPARISON: CT head and c-spine 03/03/2021 CLINICAL HISTORY: Head trauma, intracranial arterial injury suspected. FINDINGS: CT HEAD BRAIN AND VENTRICLES: Nonspecific punctate hyperdensity along the right frontal lobe (series 2, image 50) likely represents a cavernoma or artifact. No definite traumatic finding. No acute intracranial hemorrhage. No mass effect or midline shift. No abnormal extra-axial fluid collection. No evidence of acute infarct. No hydrocephalus. 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 SINUSES AND MASTOIDS: No acute abnormality. SOFT TISSUES AND SKULL: Atherosclerotic calcifications are present within the cavernous internal carotid arteries. No acute skull fracture. No acute soft tissue abnormality. 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 No severe osseous central canal stenosis. No acute fracture or traumatic malalignment. DEGENERATIVE CHANGES: Multilevel severe degenerative changes of the spine with associated multilevel severe osseous neural foraminal stenosis. No severe osseous central canal stenosis. SOFT TISSUES: Paraseptal emphysematous changes. No  prevertebral soft tissue swelling. IMPRESSION: 1. No definite acute intracranial abnormality. Nonspecific punctate hyperdensity in the right frontal lobe parenchyma, likely representing a cavernoma or artifact. 2. No acute fracture or traumatic listhesis of the cervical spine. 3. 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 Electronically signed by: Morgane Naveau MD 12/01/2023 05:30 PM EST RP Workstation: [id number]   CT Cervical Spine Wo Contrast Result Date: 12/01/2023 EXAM: CT HEAD AND CERVICAL SPINE 12/01/2023 05:18:58 PM TECHNIQUE: CT of the head and cervical spine was performed without the administration of intravenous contrast. Multiplanar reformatted images are provided for review. Automated exposure control, iterative reconstruction, and/or weight based adjustment of the mA/kV was utilized to reduce the radiation dose to as low as reasonably achievable. COMPARISON: CT head and c-spine 03/03/2021 CLINICAL HISTORY: Head trauma, intracranial arterial injury suspected. FINDINGS: CT HEAD BRAIN AND VENTRICLES: Nonspecific punctate hyperdensity along the right frontal lobe (series 2, image 50) likely represents a cavernoma or artifact. No definite traumatic finding. No acute intracranial hemorrhage. No mass effect or midline shift. No abnormal extra-axial fluid collection. No evidence of acute infarct. No hydrocephalus. 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 SINUSES AND MASTOIDS: No acute abnormality. SOFT TISSUES AND SKULL: Atherosclerotic calcifications are present within the cavernous internal carotid arteries. No acute skull fracture. No acute soft tissue abnormality. 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 No severe osseous central canal stenosis. No acute fracture or traumatic malalignment. DEGENERATIVE CHANGES:  Multilevel severe degenerative changes of the spine with associated multilevel severe osseous neural foraminal stenosis. No severe osseous central canal stenosis. SOFT TISSUES: Paraseptal emphysematous changes. No prevertebral soft tissue swelling. IMPRESSION: 1. No definite acute intracranial abnormality. Nonspecific punctate hyperdensity in the right frontal lobe parenchyma, likely representing a cavernoma or artifact. 2. No acute fracture or traumatic listhesis of the cervical spine. 3. 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 Electronically signed by: Morgane Naveau MD 12/01/2023 05:30 PM EST RP Workstation: [id number]     Assessment & Plan:   No problem-specific Assessment & Plan notes found for this encounter.   1. 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 Reports dyspnea on exertion and limited activity tolerance. Previously prescribed Trelegy, effective but discontinued due to cost. No  recent changes in symptoms. CT scan in 2023 showed emphysema and bronchial thickening. Pulmonary function test in 2023 indicated mild obstructive airway disease. No evidence of pulmonary embolism or acute chest findings on recent CTA. Concerns for possible heart failure contributing to dyspnea, but recent echocardiogram showed preserved ejection fraction and normal right ventricular function. No current oxygen use, but potential need for nocturnal oxygen therapy if indicated by overnight oximetry. - Provided samples of Trelegy and initiated paperwork for patient assistance. - Provided a sample of a bronchodilator as an interim measure. - Checked oxygen levels during ambulation and considered overnight oximetry if needed. - Discussed potential for  pulmonary rehabilitation if interested. - Monitor for signs of fluid retention, including weight changes and peripheral edema.  Recording duration: 18 minutes      I personally spent a total of *** minutes in the care of the patient today including {Time Based Coding:210964241}.   Almarie LELON Ferrari, NP 12/30/2023     [1] No Known Allergies [2]  Social History Tobacco Use  Smoking Status Former   Current packs/day: 0.00   Types: Cigarettes   Quit date: 01/12/1993   Years since quitting: 30.9  Smokeless Tobacco Never

## 2023-12-30 NOTE — Patient Instructions (Addendum)
°  VISIT SUMMARY: You came in today because you have been having difficulty breathing, especially during physical activities. We discussed your ongoing issues with COPD and emphysema, and the fact that you have not been able to continue using Trelegy due to cost. We also reviewed your recent CT scan and breathing test results, which showed emphysema and mild obstructive airway disease. You mentioned that you sometimes feel nervous about leaving home because of your breathing difficulties.  YOUR PLAN: -CHRONIC OBSTRUCTIVE PULMONARY DISEASE (COPD) WITH EMPHYSEMA: COPD with emphysema is a chronic lung condition that makes it hard to breathe. We provided you with samples of Stiolto which is a bronchodilator without a steroid and started the paperwork for patient assistance for Trelegy (inhaler you were previously on but too expensive) help with the cost. We also gave you a sample of a bronchodilator to use in the meantime. We checked your oxygen levels while you were walking and may consider an overnight oximetry test if needed. We discussed the possibility of pulmonary rehabilitation to help improve your breathing and activity levels. Please monitor for any signs of fluid retention, such as weight changes or swelling in your legs.  INSTRUCTIONS: Please follow up with the patient assistance program to ensure you can continue using Trelegy. Use the bronchodilator sample as directed. Keep an eye on your weight and any swelling in your legs, and let us  know if you notice any changes. If you are interested in pulmonary rehabilitation, please let us  know so we can provide a referral. We may also need to do an overnight oximetry test to check if you need oxygen therapy at night.  Follow-up 6 months with Dr. Alva

## 2023-12-31 ENCOUNTER — Telehealth: Payer: Self-pay | Admitting: Primary Care

## 2023-12-31 NOTE — Telephone Encounter (Signed)
 Per on Dolanda at [facility]-  order needs to state how ONO is to be done on room air, with cpap or on O2

## 2023-12-31 NOTE — Telephone Encounter (Signed)
 Updated order.

## 2023-12-31 NOTE — Telephone Encounter (Signed)
 [facility] has been made aware. NFN

## 2024-01-09 ENCOUNTER — Other Ambulatory Visit: Payer: Self-pay | Admitting: Adult Health

## 2024-01-09 DIAGNOSIS — I5031 Acute diastolic (congestive) heart failure: Secondary | ICD-10-CM

## 2024-01-09 DIAGNOSIS — I4811 Longstanding persistent atrial fibrillation: Secondary | ICD-10-CM

## 2024-01-09 DIAGNOSIS — R2681 Unsteadiness on feet: Secondary | ICD-10-CM

## 2024-01-09 DIAGNOSIS — J449 Chronic obstructive pulmonary disease, unspecified: Secondary | ICD-10-CM

## 2024-01-11 ENCOUNTER — Other Ambulatory Visit: Payer: Self-pay | Admitting: Adult Health

## 2024-01-11 DIAGNOSIS — I4811 Longstanding persistent atrial fibrillation: Secondary | ICD-10-CM

## 2024-01-11 DIAGNOSIS — I5031 Acute diastolic (congestive) heart failure: Secondary | ICD-10-CM

## 2024-01-11 DIAGNOSIS — J449 Chronic obstructive pulmonary disease, unspecified: Secondary | ICD-10-CM

## 2024-01-11 DIAGNOSIS — R2681 Unsteadiness on feet: Secondary | ICD-10-CM

## 2024-01-18 ENCOUNTER — Encounter: Payer: Self-pay | Admitting: Primary Care

## 2024-01-19 ENCOUNTER — Encounter: Payer: Self-pay | Admitting: Neurology

## 2024-01-19 ENCOUNTER — Ambulatory Visit: Payer: Self-pay | Admitting: Primary Care

## 2024-01-19 ENCOUNTER — Telehealth: Payer: Self-pay | Admitting: Neurology

## 2024-01-19 ENCOUNTER — Ambulatory Visit: Admitting: Neurology

## 2024-01-19 VITALS — BP 137/83 | HR 106 | Resp 17 | Ht 68.5 in | Wt 202.5 lb

## 2024-01-19 DIAGNOSIS — R29898 Other symptoms and signs involving the musculoskeletal system: Secondary | ICD-10-CM | POA: Diagnosis not present

## 2024-01-19 DIAGNOSIS — R200 Anesthesia of skin: Secondary | ICD-10-CM

## 2024-01-19 DIAGNOSIS — R39198 Other difficulties with micturition: Secondary | ICD-10-CM | POA: Diagnosis not present

## 2024-01-19 DIAGNOSIS — G4734 Idiopathic sleep related nonobstructive alveolar hypoventilation: Secondary | ICD-10-CM

## 2024-01-19 DIAGNOSIS — M4802 Spinal stenosis, cervical region: Secondary | ICD-10-CM

## 2024-01-19 DIAGNOSIS — R270 Ataxia, unspecified: Secondary | ICD-10-CM

## 2024-01-19 DIAGNOSIS — R269 Unspecified abnormalities of gait and mobility: Secondary | ICD-10-CM

## 2024-01-19 NOTE — Telephone Encounter (Signed)
MRI orders sent to [facility] [phone]

## 2024-01-19 NOTE — Progress Notes (Addendum)
 "  [facility]  [name] DOB: Oct 22, 1950  REFERRING DOCTOR OR PCP: Arvella Finder, MD; Darleene Shape, NP SOURCE: Patient, notes from 2023 hospitalization and primary care, imaging and lab reports, imaging studies personally reviewed.  _________________________________   HISTORICAL  CHIEF COMPLAINT:  Chief Complaint  Patient presents with   New Patient (Initial Visit)    Rm10, daughter and wife present, internal referral for gait instability, muscle weakness: vision test unable to complete as pt is blind left eye     HISTORY OF PRESENT ILLNESS:  I had the pleasure of seeing your patient, Jay [name], at [facility] for neurologic consultation regarding his gait instability and weakness  He is a 74 year old man with atrial fib CHF, COPD,  who noted worsening gait since a fall 03/03/2021.  He recalls that he was having a good day and cleaning up his garage - next thing he remembers is waking up on the ground.   This LOC was felt to possibly be due to AFib according to his family.  He was taken to the ED.   CT showed a right occipital skull fracture, small SAH (left frontal).   Since that time, he has felt weak in his legs and also has had some numbness.  Both the weakness and numbness have progressed some he felt that they did not exist until after the fall    He also has had increased urinary urgency and has had some incontinence since the fall.  THis has also worsened.   Gait has become shuffling over the past year.     I have reviewed the imaging studies hospitalization.  The CT scan shows the fracture with a small amount of traumatic subarachnoid hemorrhage in the inferior left frontal lobe a small subdural in the left frontal temporal lobe.  He also had generalized cortical atrophy.  The CT scan of the cervical spine at that time showed no acute findings though there was multilevel degenerative change including at least moderate spinal  stenosis at C4-C5.  Though he notes the weakness mostly in the legs, the left arm also feels a little bit weak.   He denies facial weakness.    He has had numbness in his leg since the fall.  He feels the numbess has not progressed much since the fall.  His gait is more shuffling and less balanced since the fall.  He sometimes feels he is being pushed towards the right.  He has had some progressive memory decline.    He stopped driving about a year ago in 2025.  His wife recounts that he was riding up traffic and felt he did not have complete control over his driving  He has had reduced hearing AS worse than AD and is going to be getting hearing aids.  He has AFib, and related orthostatic hypotension.  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  Handwriting is poor.  No FH of BET or PD    Imaging: CT scan 12/01/2023 showed generalized cortical atrophy and mild hyperdense changes in the hemispheres consistent with chronic microvascular ischemic change.  Sulci are age effaced at the vertex.  The callosal angle was 78 degrees at the posterior commissure  CT HEad 03/03/2021 showed IMPRESSION: 1. Nondisplaced fracture of the RIGHT occipital bone extending to the RIGHT  skull base. No extension into the adjacent RIGHT mastoid air cells. 2. Small focal acute parenchymal and/or traumatic subarachnoid hemorrhage within the lower aspect of the LEFT frontal lobe. 3. Small acute subdural hemorrhage overlying the LEFT frontotemporal lobe, measuring up to 3 mm thickness. No associated mass effect or midline shift. 4. Additional probable small foci of acute subdural hemorrhage overlying the tentorium and at the inferior margin of the anterior falx. 5. No fracture or acute subluxation within the cervical spine. 6. Extensive degenerative change within the mid/lower  cervical spine, as detailed above. 7. Bilateral carotid atherosclerosis.  CT Cervical spine 03/03/2021 showed Degenerative spondylosis throughout the cervical spine, moderate in degree with associated disc space narrowings and osseous spurring at most levels. Associated disc-osteophytic bulge at C4-5 causing moderate-to-severe central canal stenosis with probable effacement of the anterior thecal sac. Additional degenerative hypertrophic changes of the uncovertebral and facet joints causing severe bilateral neural foramen stenoses at C4-5 and C5-6 with probable associated nerve root impingements.   REVIEW OF SYSTEMS: Constitutional: No fevers, chills, sweats, or change in appetite Eyes: No visual changes, double vision, eye pain Ear, nose and throat: No hearing loss, ear pain, nasal congestion, sore throat Cardiovascular: No chest pain, palpitations Respiratory:  No shortness of breath at rest or with exertion.   No wheezes GastrointestinaI: No nausea, vomiting, diarrhea, abdominal pain, fecal incontinence Genitourinary:  No dysuria, urinary retention or frequency.  No nocturia. Musculoskeletal:  No neck pain, back pain Integumentary: No rash, pruritus, skin lesions Neurological: as above Psychiatric: No depression at this time.  No anxiety Endocrine: No palpitations, diaphoresis, change in appetite, change in weigh or increased thirst Hematologic/Lymphatic:  No anemia, purpura, petechiae. Allergic/Immunologic: No itchy/runny eyes, nasal congestion, recent allergic reactions, rashes  ALLERGIES: Allergies[1]  HOME MEDICATIONS: Current Medications[2]  PAST MEDICAL HISTORY: Past Medical History:  Diagnosis Date   Anxiety    Arthritis    Chronic kidney disease    COPD (chronic obstructive pulmonary disease) (HCC)    Depression    Dysrhythmia    Emphysema of lung (HCC)    GERD (gastroesophageal reflux disease)    Hypertension    PAF (paroxysmal atrial fibrillation) (HCC)     Shortness of breath    with excertion    PAST SURGICAL HISTORY: Past Surgical History:  Procedure Laterality Date   APPENDECTOMY     COLONOSCOPY WITH PROPOFOL  N/A 03/25/2022   Procedure: COLONOSCOPY WITH PROPOFOL ;  Surgeon: Shaaron Lamar HERO, MD;  Location: [facility];  Service: Endoscopy;  Laterality: N/A;  9:00 am   EYE SURGERY     POLYPECTOMY  03/25/2022   Procedure: POLYPECTOMY INTESTINAL;  Surgeon: Shaaron Lamar HERO, MD;  Location: [facility];  Service: Endoscopy;;    FAMILY HISTORY: Family History  Problem Relation Age of Onset   Hypertension Mother    Cancer Father    Hypertension Father    Hypertension Sister    Cancer Brother    Hypertension Brother    Cancer - Colon Neg Hx    Colon polyps Neg Hx     SOCIAL HISTORY: Social History   Socioeconomic History   Marital status: Married    Spouse name: Not on file   Number of children: Not on file   Years of education: Not on file   Highest education level: 9th grade  Occupational History   Not on file  Tobacco Use   Smoking status: Former    Current packs/day: 0.00    Types: Cigarettes  Quit date: 01/12/1993    Years since quitting: 31.0   Smokeless tobacco: Never  Vaping Use   Vaping status: Never Used  Substance and Sexual Activity   Alcohol use: No    Comment: Quit 1995   Drug use: No   Sexual activity: Not on file  Other Topics Concern   Not on file  Social History Narrative   Not on file   Social Drivers of Health   Tobacco Use: Medium Risk (01/19/2024)   Patient History    Smoking Tobacco Use: Former    Smokeless Tobacco Use: Never    Passive Exposure: Not on file  Financial Resource Strain: Medium Risk (11/18/2023)   Overall Financial Resource Strain (CARDIA)    Difficulty of Paying Living Expenses: Somewhat hard  Food Insecurity: No Food Insecurity (11/29/2023)   Epic    Worried About Programme Researcher, Broadcasting/film/video in the Last Year: Never true    Ran Out of Food in the Last Year: Never true   Transportation Needs: No Transportation Needs (11/29/2023)   Epic    Lack of Transportation (Medical): No    Lack of Transportation (Non-Medical): No  Physical Activity: Inactive (11/18/2023)   Exercise Vital Sign    Days of Exercise per Week: 0 days    Minutes of Exercise per Session: Not on file  Stress: Stress Concern Present (11/18/2023)   [employer] of Occupational Health - Occupational Stress Questionnaire    Feeling of Stress: Very much  Social Connections: Socially Integrated (11/29/2023)   Social Connection and Isolation Panel    Frequency of Communication with Friends and Family: Twice a week    Frequency of Social Gatherings with Friends and Family: Once a week    Attends Religious Services: More than 4 times per year    Active Member of Clubs or Organizations: No    Attends Engineer, Structural: More than 4 times per year    Marital Status: Married  Catering Manager Violence: Not At Risk (11/29/2023)   Epic    Fear of Current or Ex-Partner: No    Emotionally Abused: No    Physically Abused: No    Sexually Abused: No  Depression (PHQ2-9): High Risk (11/19/2023)   Depression (PHQ2-9)    PHQ-2 Score: 11  Alcohol Screen: Low Risk (07/03/2022)   Alcohol Screen    Last Alcohol Screening Score (AUDIT): 0  Housing: Low Risk (11/29/2023)   Epic    Unable to Pay for Housing in the Last Year: No    Number of Times Moved in the Last Year: 0    Homeless in the Last Year: No  Utilities: Not At Risk (11/29/2023)   Epic    Threatened with loss of utilities: No  Health Literacy: Not on file       PHYSICAL EXAM  Vitals:   01/19/24 1245 01/19/24 1250  BP: (!) 140/74 137/83  Pulse: (!) 106   Resp: 17   SpO2: 96%   Weight: 202 lb 8 oz (91.9 kg)   Height: 5' 8.5 (1.74 m)     Body mass index is 30.34 kg/m.   General: The patient is well-developed and well-nourished and in no acute distress  HEENT:  Head is [place]/AT.  Sclera are anicteric.  He has left  phthisis bulbi.  Neck: No carotid bruits are noted.  The neck is nontender though has a reduced range of motion.  Cardiovascular: The heart has irregularly irregular rate and rhythm with a normal S1 and S2.  There were no murmurs, gallops or rubs.    Skin: Trace pedal edema    Neurologic Exam  Mental status: The patient is alert and oriented x 2 at the time of the examination. The patient has apparent normal recent and remote memory, with an apparently normal attention span and concentration ability.   Speech is normal.  Cranial nerves: Extraocular movements are full.  There is good facial sensation to soft touch bilaterally.Facial strength is normal.  Trapezius and sternocleidomastoid strength is normal. No dysarthria is noted.  The tongue is midline, and the patient has symmetric elevation of the soft palate. No obvious hearing deficits are noted.  Motor:  Muscle bulk is normal.   Tone is normal. Strength is  5 / 5 in the right arm and leg.  Strength was 4+/5 in the left arm and leg..  Mildly reduced left rapid alternating movements.  Sensory: Sensory testing is intact to pinprick, soft touch and vibration sensation in the arms and proximal legs.  Mild reduced sensation in the toes  Coordination: Cerebellar testing reveals normal finger-nose-finger and mildly reduced left worse than right heel-to-shin .  Gait and station: The station was normal.  His gait had a mildly reduced stride.  He took 5 steps to turn 180 degrees.  He had retropulsion.  Tandem not tested due to stability.  Romberg was borderline.   Reflexes: Deep tendon reflexes are mildly increased.  There was spread at the knees bilaterally.  No ankle clonus.  Plantar responses were normal.    DIAGNOSTIC DATA (LABS, IMAGING, TESTING) - I reviewed patient records, labs, notes, testing and imaging myself where available.  Lab Results  Component Value Date   WBC 7.7 11/29/2023   HGB 15.0 12/01/2023   HCT 44.0 12/01/2023    MCV 93.5 11/29/2023   PLT 231 11/29/2023      Component Value Date/Time   NA 137 12/03/2023 1104   K 4.1 12/03/2023 1104   CL 102 12/03/2023 1104   CO2 25 12/03/2023 1104   GLUCOSE 147 (H) 12/03/2023 1104   BUN 33 (H) 12/03/2023 1104   CREATININE 1.42 12/03/2023 1104   CREATININE 1.31 (H) 11/19/2023 1536   CALCIUM  8.9 12/03/2023 1104   PROT 6.9 11/29/2023 1425   PROT 6.9 01/08/2023 1528   ALBUMIN 3.9 11/29/2023 1425   ALBUMIN 4.2 01/08/2023 1528   AST 15 11/29/2023 1425   ALT 12 11/29/2023 1425   ALKPHOS 74 11/29/2023 1425   BILITOT 0.4 11/29/2023 1425   BILITOT 0.5 01/08/2023 1528   GFRNONAA 57 (L) 11/30/2023 0420   Lab Results  Component Value Date   CHOL 203 (H) 11/19/2023   HDL 55 11/19/2023   LDLCALC 117 (H) 11/19/2023   TRIG 185 (H) 11/19/2023   CHOLHDL 3.7 11/19/2023   Lab Results  Component Value Date   HGBA1C 5.8 (H) 11/19/2023   No results found for: VITAMINB12 Lab Results  Component Value Date   TSH 2.44 11/19/2023       ASSESSMENT AND PLAN  Ataxia - Plan: MR BRAIN WO CONTRAST, Vitamin B12  Gait disturbance - Plan: MR CERVICAL SPINE WO CONTRAST, Vitamin B12  Leg numbness - Plan: Vitamin B12  Weakness of both lower extremities - Plan: MR CERVICAL SPINE WO CONTRAST  Spinal stenosis of cervical region - Plan: MR CERVICAL SPINE WO CONTRAST  Urinary dysfunction   In summary, Mr. Bonny is a 74 year old man who had the onset of gait disturbance associated with mild leg weakness and numbness following  a fall in early 2023.  On exam, he had mild left-sided weakness, mild reduced sensation in the feet, gait disturbance and increased deep tendon reflexes.  I am most concerned about the possibility of cervical myelopathy, especially as the CT scan in 2023 showed at least moderate spinal stenosis at C4-C5.  Since he is on a blood thinner I am also concerned that he could have chronic subdural hematoma.  We will check MRI of the brain and cervical spine to  further evaluate these possibilities.  The ventricles are enlarged and sulci were crowded at the vertex.  Though this could be seen with normal pressure hydrocephalus, the extent of ventriculomegaly did not seem to be severe enough to explain his symptoms.  I will also check vitamin B12 level to rule out a metabolic myelopathy.  He will return to see me in 3 to 4 months or sooner if there are new or worsening neurologic symptoms or based on the results of the studies.  Additionally, based on the results of the imaging he may need referral to surgery  Thank you for asking me to see Mr. Gustafson.  Please let me know if I can be of further assistance with him or other patients in the future.  This visit is part of a comprehensive longitudinal care medical relationship regarding the patients primary diagnosis of gait disturbance and related concerns.   Federick Levene A. Vear, MD, [facility] 01/19/2024, 12:58 PM Certified in Neurology, Clinical Neurophysiology, Sleep Medicine and Neuroimaging  [facility] [address] [place], [place] [address] [phone]     [1] No Known Allergies [2]  Current Outpatient Medications:    acetaminophen  (TYLENOL ) 500 MG tablet, Take 1,000 mg by mouth every 6 (six) hours as needed for moderate pain., Disp: , Rfl:    apixaban  (ELIQUIS ) 5 MG TABS tablet, Take 1 tablet (5 mg total) by mouth 2 (two) times daily., Disp: 60 tablet, Rfl: 3   aspirin  81 MG chewable tablet, Chew 81 mg by mouth 2 (two) times daily., Disp: , Rfl:    atorvastatin  (LIPITOR) 40 MG tablet, Take 1 tablet (40 mg total) by mouth daily., Disp: 90 tablet, Rfl: 3   buPROPion  (WELLBUTRIN  XL) 150 MG 24 hr tablet, Take 3 tablets (450 mg total) by mouth every morning., Disp: 90 tablet, Rfl: 2   donepezil  (ARICEPT ) 10 MG tablet, Take 1 tablet (10 mg total) by mouth at bedtime., Disp: 90 tablet, Rfl: 3   DULoxetine  (CYMBALTA ) 60 MG capsule, Take 2 capsules (120 mg total) by mouth every  evening. (Patient taking differently: Take 60 mg by mouth 2 (two) times daily.), Disp: 180 capsule, Rfl: 0   esomeprazole  (NEXIUM ) 40 MG capsule, Take 1 capsule (40 mg total) by mouth every morning., Disp: 90 capsule, Rfl: 3   famotidine  (PEPCID ) 20 MG tablet, Take 1 tablet by mouth once daily, Disp: 90 tablet, Rfl: 0   finasteride  (PROSCAR ) 5 MG tablet, Take 1 tablet by mouth once daily, Disp: 90 tablet, Rfl: 0   Fluticasone-Umeclidin-Vilant (TRELEGY ELLIPTA ) 100-62.5-25 MCG/ACT AEPB, INHALE 1 PUFF INTO LUNGS ONCE DAILY, Disp: 60 each, Rfl: 0   furosemide  (LASIX ) 20 MG tablet, Take 0.5 tablets (10 mg total) by mouth every other day., Disp: 15 tablet, Rfl: 1   lurasidone  (LATUDA ) 40 MG TABS tablet, Take 1 tablet (40 mg total) by mouth at bedtime., Disp: 90 tablet, Rfl: 0   metoprolol  tartrate (LOPRESSOR ) 50 MG tablet, Take 1 tablet (50 mg total) by mouth 2 (  two) times daily., Disp: 90 tablet, Rfl: 3   tamsulosin  (FLOMAX ) 0.4 MG CAPS capsule, Take 1 capsule (0.4 mg total) by mouth daily., Disp: 90 capsule, Rfl: 3   Tiotropium Bromide-Olodaterol (STIOLTO RESPIMAT ) 2.5-2.5 MCG/ACT AERS, Inhale 2 puffs into the lungs daily., Disp: , Rfl:    traMADol  (ULTRAM ) 50 MG tablet, Take 1 tablet (50 mg total) by mouth every 6 (six) hours as needed for severe pain (pain score 7-10)., Disp: 30 tablet, Rfl: 2  "

## 2024-01-20 ENCOUNTER — Ambulatory Visit: Payer: Self-pay | Admitting: Neurology

## 2024-01-20 ENCOUNTER — Telehealth: Payer: Self-pay

## 2024-01-20 LAB — VITAMIN B12: Vitamin B-12: 1906 pg/mL — ABNORMAL HIGH (ref 232–1245)

## 2024-01-20 NOTE — Telephone Encounter (Signed)
 Please advise

## 2024-01-20 NOTE — Telephone Encounter (Signed)
 Copied from CRM [id number]. Topic: Clinical - Medication Question >> Jan 20, 2024  4:29 PM Charolett L wrote: Reason for CRM: Patient wife called in and stated that the patient is claustrophobic and he's taking an MRI and needs medication sent to the pharmacy to calm him prior to the appt on 01/14

## 2024-01-21 ENCOUNTER — Other Ambulatory Visit: Payer: Self-pay | Admitting: Adult Health

## 2024-01-21 MED ORDER — DIAZEPAM 5 MG PO TABS: ORAL_TABLET | ORAL | 0 refills | Status: AC

## 2024-01-21 NOTE — Telephone Encounter (Signed)
Patient spouse notified of update  and verbalized understanding. 

## 2024-01-22 ENCOUNTER — Other Ambulatory Visit: Payer: Self-pay | Admitting: Adult Health

## 2024-01-22 DIAGNOSIS — N401 Enlarged prostate with lower urinary tract symptoms: Secondary | ICD-10-CM

## 2024-01-23 ENCOUNTER — Other Ambulatory Visit: Payer: Self-pay | Admitting: Adult Health

## 2024-01-24 ENCOUNTER — Encounter: Payer: Self-pay | Admitting: Neurology

## 2024-01-24 ENCOUNTER — Telehealth: Payer: Self-pay | Admitting: *Deleted

## 2024-01-24 NOTE — Telephone Encounter (Signed)
 Copied from CRM #[id number]. Topic: General - Other >> Jan 24, 2024  3:51 PM Wess RAMAN wrote: Reason for CRM: Jon, nurse care manager from [facility], wanted to let [name], NP know that there is a delay in OT eval and planned for the week of 2/8  Callback #: [phone]

## 2024-01-25 ENCOUNTER — Other Ambulatory Visit: Payer: Self-pay | Admitting: Adult Health

## 2024-01-25 ENCOUNTER — Ambulatory Visit: Payer: Self-pay

## 2024-01-25 NOTE — Telephone Encounter (Signed)
 See documentation below, please advise.  Message from Antwanette L sent at 01/25/2024  8:33 AM EST  Reason for Triage:Kim a nurse with [facility] is calling to inform the provider that she saw the patient yesterday and learned the patient has not taken his medication(apixaban  (ELIQUIS ) 5 MG TABS tablet) for the past two weeks.The patient reports he cannot afford the medication, which costs approximately $300, and he is on a fixed income. Luke is asking whether the provider can prescribe an alternative medication or provide a coupon. She can be reached at [phone]

## 2024-01-25 NOTE — Telephone Encounter (Signed)
 Please advise

## 2024-01-25 NOTE — Telephone Encounter (Signed)
°  The original prescription was discontinued on 12/01/2023 by Ward, Angelica, CPhT for the following reason: Patient Preference. Renewing this prescription may not be appropriate

## 2024-01-26 ENCOUNTER — Inpatient Hospital Stay: Admission: RE | Admit: 2024-01-26 | Discharge: 2024-01-26 | Attending: Neurology | Admitting: Neurology

## 2024-01-26 ENCOUNTER — Telehealth: Payer: Self-pay | Admitting: Pharmacy Technician

## 2024-01-26 ENCOUNTER — Other Ambulatory Visit (HOSPITAL_COMMUNITY): Payer: Self-pay

## 2024-01-26 DIAGNOSIS — R29898 Other symptoms and signs involving the musculoskeletal system: Secondary | ICD-10-CM | POA: Diagnosis not present

## 2024-01-26 DIAGNOSIS — M4802 Spinal stenosis, cervical region: Secondary | ICD-10-CM

## 2024-01-26 DIAGNOSIS — R270 Ataxia, unspecified: Secondary | ICD-10-CM

## 2024-01-26 DIAGNOSIS — R269 Unspecified abnormalities of gait and mobility: Secondary | ICD-10-CM

## 2024-01-26 NOTE — Telephone Encounter (Signed)
" ° °  Xarelto is not covered by his plan -plan benefit exclusion. [employer] requires the patient to have paid 3% of his income on out of pocket expenses before they will provide assistance. He would have to have cardiomyopathy to get a grant. We could send him a [employer]ohnson [employer] johnson application to see if he can get approved for 3m company and [employer] company assistance for xarelto. Pradaxa is not covered by his insurance but it is 54.37 on goodrx at [facility] for 30 days.  "

## 2024-01-26 NOTE — Telephone Encounter (Signed)
 Patient notified of update  and verbalized understanding.

## 2024-01-26 NOTE — Telephone Encounter (Signed)
 Called pt multiple times no answer. Will call later.

## 2024-01-27 NOTE — Telephone Encounter (Signed)
 Hi, can someone please get the provider to sign the form that is scanned in media under Xarelto  PROVIDER APPLICATION for this patient and fax back to [phone]? Thank you!   PAP: Patient assistance application for Xarelto through [employer]) has been mailed to pt's home address on file

## 2024-01-27 NOTE — Telephone Encounter (Signed)
 Dr Alvan will be back in office 1/19

## 2024-01-27 NOTE — Telephone Encounter (Signed)
 Provider formed signed and faxed back to S. Hennessy, CPhT

## 2024-01-27 NOTE — Telephone Encounter (Signed)
" ° °  Received provider portion and scanned in media. Waiting on patient portion then will send to [employer] for xarelto assistance "

## 2024-01-28 ENCOUNTER — Telehealth: Payer: Self-pay | Admitting: Neurology

## 2024-01-28 DIAGNOSIS — G911 Obstructive hydrocephalus: Secondary | ICD-10-CM

## 2024-01-28 DIAGNOSIS — R269 Unspecified abnormalities of gait and mobility: Secondary | ICD-10-CM

## 2024-01-28 DIAGNOSIS — M4802 Spinal stenosis, cervical region: Secondary | ICD-10-CM

## 2024-01-28 NOTE — Telephone Encounter (Signed)
 I tried to call Jay [name] to go over the results of his cervical spine and brain MRIs.  I got voicemail left a message and we will try to reach him again later.  The MRI of the brain shows ventriculomegaly and other changes consistent with normal pressure hydrocephalus.  Additionally, he has the sequela of the traumatic brain injury from 2023 with mild encephalomalacia in the orbital frontal cortex  The MRI of the cervical spine shows moderately severe (worse on the right) spinal stenosis at C4-C5 due to a calcified right paramedian disc protrusion.  There is also probable bilateral C5 nerve root compression and potential for right C6 nerve root compression at C5-C6 where there is mild spinal stenosis.  Difficulties with his gait could be due to a combination of cervical myelopathy and normal pressure hydrocephalus.  I was going to discuss a referral to neurosurgery.  I also considered a high-volume lumbar puncture but due to the significant spinal stenosis this might present higher risk if done first.  I will try to reach him again later today.    Addendum: I was able to speak to his wife.  I discussed the above findings and my recommendation for a neurosurgical consultation.

## 2024-01-28 NOTE — Telephone Encounter (Signed)
Pt's wife returned call. Please call back when available.  °

## 2024-01-31 NOTE — Progress Notes (Unsigned)
 BH MD/PA/NP OP Progress Note  02/01/2024 10:32 AM Jay [name]  MRN:  [id number]  Visit Diagnosis:    ICD-10-CM   1. Moderate episode of recurrent major depressive disorder (HCC)  F33.1 buPROPion  (WELLBUTRIN  XL) 150 MG 24 hr tablet    lurasidone  (LATUDA ) 40 MG TABS tablet    DULoxetine  (CYMBALTA ) 60 MG capsule    2. Encounter for long-term (current) use of medications  Z79.899 buPROPion  (WELLBUTRIN  XL) 150 MG 24 hr tablet    3. GAD (generalized anxiety disorder)  F41.1 lurasidone  (LATUDA ) 40 MG TABS tablet    DULoxetine  (CYMBALTA ) 60 MG capsule      Jay [name] is a 74 y.o. male with a history of MDD, GAD, COPD, HTN,  who presented to [facility] at [facility] for initial evaluation on 04/13/2022.    At initial evaluation patient reported neurovegetative symptoms of depression including low mood, anhedonia, amotivation, poor sleep, increased appetite, excessive tearfulness, fatigue, poor concentration, and intermittent passive SI.  He denied any passive SI at this time reporting that there is only been 2 episodes since 2022.  He denied any intent or plan since around 2014.  Safety planning and crisis resources were discussed. In addition to depression patient also reported significant anxiety.  He endorsed symptoms of constant worry that he is unable to control, difficulty relaxing, restlessness, increased irritability, and fear of something awful happening.  Patient did endorse having episodes of panic where he is short of breath, restless, diaphoretic, and can get muscle rigidity.  These were more frequent while on Wellbutrin  though still occur occasionally since discontinuing.  Patient met criteria for MDD and GAD.  Jay [name] presents for follow-up evaluation. Today, 02/01/24, patient has had improvement in mood symptoms likely secondary to medications and increased support.  While he is having significant medical issues with recent diagnosis of  normal pressure hydrocephalus and hospitalization in the interim, patient has not been experiencing related increased depression.  In fact it has increased the connection between him and his family which has improved mood symptoms.  Would be appropriate continue on current regimen.  Patient is connected with neurology and has been referred to neurosurgery to evaluate normal pressure hydrocephalus.  Plan: # MDD/GAD Past medication trials:  Status of problem: Ongoing Interventions: - Change Cymbalta  to 60 mg BID - Continue Latuda  40 mg with dinner - Continue Wellbutrin  XL 450 mg daily - Consider restarting therapy in the future  # Memory and balance concerns Past medication trials:  Status of problem: Ongoing Interventions: -- Continue donepezil  10 mg daily managed by PCP - Connected with neurology - MRI reviewed which showed signs of normal pressure hydrocephalus  Risk Assessment: An assessment of suicide and violence risk factors was performed as part of this evaluation and is not significantly changed from the last visit. While future psychiatric events cannot be accurately predicted, the patient does not currently require acute inpatient psychiatric care and does not currently meet [place]  involuntary commitment criteria. Patient was given contact information for crisis resources, behavioral health clinic and was instructed to call 911 for emergencies.   Chief Complaint:  Chief Complaint  Patient presents with   Follow-up   HPI: Jay [name] reports a lot has been going on the past 5 months. He went to the hospital twice and his partner got a knee replacement. His partner has since left her job and is now spending time at home with [place].  Notably he is still having ongoing  issues with balance and has rarely been able to get off the couch.  Recent imaging showed signs of normal pressure hydrocephalus.  Neurology has referred him to neurosurgery.   Despite this patient reports that  his moods have been pretty good and he feels like he is improved.  He denies experiencing the racing thoughts or excessive worry that he used to have.  Furthermore he is not feeling as depressed.  He thinks this is multifactorial in part due to medication and also due to the increased support.  Now his partner is home with him every day and his kids have been coming by daily.  He is also seeing his grandkids much more frequently than he had in the past.  This is a considerable improvement considering that he had not been talking with his son as recently as a year ago.  Patient denies any adverse medication side effects.  Past Psychiatric History:  Past psychiatric diagnoses: MDD and GAD Psychiatric hospitalizations: Denies Past suicide attempts: Denies Hx of self harm: Denies Hx of violence towards others: Denies Prior psychiatric providers: Saw psychiatrist in 2009 Prior therapy: Had connected with Darleene Ricker in the past Access to firearms: Denies  Prior medication trials: Has had trials of Wellbutrin , Lamictal , Restoril, and Xanax   Substance use: Denies current substance use.  Had used alcohol and marijuana over 20 to 30 years ago  Past Medical History:  Past Medical History:  Diagnosis Date   Anxiety    Arthritis    Chronic kidney disease    COPD (chronic obstructive pulmonary disease) (HCC)    Depression    Dysrhythmia    Emphysema of lung (HCC)    GERD (gastroesophageal reflux disease)    Hypertension    PAF (paroxysmal atrial fibrillation) (HCC)    Shortness of breath    with excertion    Past Surgical History:  Procedure Laterality Date   APPENDECTOMY     COLONOSCOPY WITH PROPOFOL  N/A 03/25/2022   Procedure: COLONOSCOPY WITH PROPOFOL ;  Surgeon: Shaaron Lamar HERO, MD;  [facility];  Service: Endoscopy;  Laterality: N/A;  9:00 am   EYE SURGERY     POLYPECTOMY  03/25/2022   Procedure: POLYPECTOMY INTESTINAL;  Surgeon: Shaaron Lamar HERO, MD;  Location: [facility];   Service: Endoscopy;;    Family History:  Family History  Problem Relation Age of Onset   Hypertension Mother    Cancer Father    Hypertension Father    Hypertension Sister    Cancer Brother    Hypertension Brother    Cancer - Colon Neg Hx    Colon polyps Neg Hx     Social History:  Social History   Socioeconomic History   Marital status: Married    Spouse name: Not on file   Number of children: Not on file   Years of education: Not on file   Highest education level: 9th grade  Occupational History   Not on file  Tobacco Use   Smoking status: Former    Current packs/day: 0.00    Types: Cigarettes    Quit date: 01/12/1993    Years since quitting: 31.0   Smokeless tobacco: Never  Vaping Use   Vaping status: Never Used  Substance and Sexual Activity   Alcohol use: No    Comment: Quit 1995   Drug use: No   Sexual activity: Not on file  Other Topics Concern   Not on file  Social History Narrative   Not on  file   Social Drivers of Health   Tobacco Use: Medium Risk (02/01/2024)   Patient History    Smoking Tobacco Use: Former    Smokeless Tobacco Use: Never    Passive Exposure: Not on file  Financial Resource Strain: Medium Risk (11/18/2023)   Overall Financial Resource Strain (CARDIA)    Difficulty of Paying Living Expenses: Somewhat hard  Food Insecurity: No Food Insecurity (11/29/2023)   Epic    Worried About Programme Researcher, Broadcasting/film/video in the Last Year: Never true    Ran Out of Food in the Last Year: Never true  Transportation Needs: No Transportation Needs (11/29/2023)   Epic    Lack of Transportation (Medical): No    Lack of Transportation (Non-Medical): No  Physical Activity: Inactive (11/18/2023)   Exercise Vital Sign    Days of Exercise per Week: 0 days    Minutes of Exercise per Session: Not on file  Stress: Stress Concern Present (11/18/2023)   [employer] of Occupational Health - Occupational Stress Questionnaire    Feeling of Stress: Very much   Social Connections: Socially Integrated (11/29/2023)   Social Connection and Isolation Panel    Frequency of Communication with Friends and Family: Twice a week    Frequency of Social Gatherings with Friends and Family: Once a week    Attends Religious Services: More than 4 times per year    Active Member of Clubs or Organizations: No    Attends Engineer, Structural: More than 4 times per year    Marital Status: Married  Depression (PHQ2-9): High Risk (11/19/2023)   Depression (PHQ2-9)    PHQ-2 Score: 11  Alcohol Screen: Low Risk (07/03/2022)   Alcohol Screen    Last Alcohol Screening Score (AUDIT): 0  Housing: Low Risk (11/29/2023)   Epic    Unable to Pay for Housing in the Last Year: No    Number of Times Moved in the Last Year: 0    Homeless in the Last Year: No  Utilities: Not At Risk (11/29/2023)   Epic    Threatened with loss of utilities: No  Health Literacy: Not on file    Allergies: Allergies[1]  Current Medications: Current Outpatient Medications  Medication Sig Dispense Refill   acetaminophen  (TYLENOL ) 500 MG tablet Take 1,000 mg by mouth every 6 (six) hours as needed for moderate pain.     apixaban  (ELIQUIS ) 5 MG TABS tablet Take 1 tablet (5 mg total) by mouth 2 (two) times daily. 60 tablet 3   aspirin  81 MG chewable tablet Chew 81 mg by mouth 2 (two) times daily.     atorvastatin  (LIPITOR) 40 MG tablet Take 1 tablet (40 mg total) by mouth daily. 90 tablet 3   buPROPion  (WELLBUTRIN  XL) 150 MG 24 hr tablet Take 3 tablets (450 mg total) by mouth every morning. 90 tablet 2   diazepam  (VALIUM ) 5 MG tablet Take one tablet 30 minutes before MRI and another tab just prior to MRI if needed 2 tablet 0   donepezil  (ARICEPT ) 10 MG tablet Take 1 tablet (10 mg total) by mouth at bedtime. 90 tablet 3   DULoxetine  (CYMBALTA ) 60 MG capsule Take 1 capsule (60 mg total) by mouth 2 (two) times daily. 180 capsule 0   esomeprazole  (NEXIUM ) 40 MG capsule Take 1 capsule (40 mg  total) by mouth every morning. 90 capsule 3   famotidine  (PEPCID ) 20 MG tablet Take 1 tablet by mouth once daily 90 tablet 0   finasteride  (PROSCAR )  5 MG tablet Take 1 tablet by mouth once daily 90 tablet 0   Fluticasone-Umeclidin-Vilant (TRELEGY ELLIPTA ) 100-62.5-25 MCG/ACT AEPB INHALE 1 PUFF INTO LUNGS ONCE DAILY 60 each 0   furosemide  (LASIX ) 20 MG tablet Take 0.5 tablets (10 mg total) by mouth every other day. 15 tablet 1   lurasidone  (LATUDA ) 40 MG TABS tablet Take 1 tablet (40 mg total) by mouth at bedtime. 90 tablet 0   metoprolol  tartrate (LOPRESSOR ) 50 MG tablet Take 1 tablet (50 mg total) by mouth 2 (two) times daily. 90 tablet 3   tamsulosin  (FLOMAX ) 0.4 MG CAPS capsule Take 1 capsule (0.4 mg total) by mouth daily. 90 capsule 3   Tiotropium Bromide-Olodaterol (STIOLTO RESPIMAT ) 2.5-2.5 MCG/ACT AERS Inhale 2 puffs into the lungs daily.     traMADol  (ULTRAM ) 50 MG tablet Take 1 tablet (50 mg total) by mouth every 6 (six) hours as needed for severe pain (pain score 7-10). 30 tablet 2   No current facility-administered medications for this visit.     Psychiatric Specialty Exam: There were no vitals taken for this visit.There is no height or weight on file to calculate BMI. Review of Systems  General Appearance: Fairly Groomed  Eye Contact:  Fair  Speech:  Clear and Coherent  Volume:  Normal  Mood:  Euthymic  Affect:  Appropriate  Thought Content: Logical   Suicidal Thoughts:  No  Homicidal Thoughts:  No  Thought Process:  Coherent  Orientation:  Full (Time, Place, and Person)    Memory: Immediate;   Poor  Judgment:  Fair  Insight:  Fair  Concentration:  Concentration: Fair  Recall:  not formally assessed   Fund of Knowledge: Fair  Language: Good  Psychomotor Activity:  Normal  Akathisia:  No  AIMS (if indicated): not done  Assets:  Communication Skills Desire for Improvement Financial Resources/Insurance Housing  ADL's:  Intact  Cognition: WNL  Sleep:  Good    Metabolic Disorder Labs: Lab Results  Component Value Date   HGBA1C 5.8 (H) 11/19/2023   MPG 120 11/19/2023   No results found for: PROLACTIN Lab Results  Component Value Date   CHOL 203 (H) 11/19/2023   TRIG 185 (H) 11/19/2023   HDL 55 11/19/2023   CHOLHDL 3.7 11/19/2023   VLDL 85.1 07/08/2022   LDLCALC 117 (H) 11/19/2023   LDLCALC 129 (H) 01/08/2023   Lab Results  Component Value Date   TSH 2.44 11/19/2023   TSH 2.77 07/08/2022    Therapeutic Level Labs: No results found for: LITHIUM No results found for: VALPROATE No results found for: CBMZ   Screenings: GAD-7    Advertising Copywriter from 02/16/2023 in [facility] at [facility] Visit from 07/08/2022 in [facility] [place] [facility] Visit from [facility]/13/2022 [facility]  Total GAD-7 Score 14 13 20    PHQ2-9    Flowsheet Row Office Visit from 11/19/2023 in [facility] [place] Counselor from 02/16/2023 in [facility] Visit from 01/08/2023 in [facility] [place] Office Visit from 07/08/2022 in [facility] [place] [facility] [place] Clinical Support from 07/03/2022 in [facility] [place]  PHQ-2 Total Score 2 6 6 6  0  PHQ-9 Total Score 11 20 24 21  --   Flowsheet Row ED from 12/01/2023 in [facility] at [facility] ED to Hosp-Admission (Discharged) from 11/29/2023 in [place] [facility] Counselor from 02/16/2023 in [facility] at [facility] RISK CATEGORY No Risk No Risk No Risk    Collaboration of Care: Collaboration of Care: Medication Management AEB medication prescription and Other provider involved in patient's care AEB hospital, ED, and neurology chart review  Patient/Guardian was advised Release of Information must  be obtained prior to any record release in order to collaborate their care with an outside provider. Patient/Guardian was advised if they have not already done so to contact the registration department to sign all necessary forms in order for us  to release information regarding their care.   Consent: Patient/Guardian gives verbal consent for treatment and assignment of benefits for services provided during this visit. Patient/Guardian expressed understanding and agreed to proceed.    Arvella CHRISTELLA Finder, MD 02/01/2024, 10:32 AM   Virtual Visit via Video Note  I connected with Reeves Kalata on 02/01/24 at 10:00 AM EST by a video enabled telemedicine application and verified that I am speaking with the correct person using two identifiers.  Location: Patient: Home Provider: Home Office   I discussed the limitations of evaluation and management by telemedicine and the availability of in person appointments. The patient expressed understanding and agreed to proceed.   I discussed the assessment and treatment plan with the patient. The patient was provided an opportunity to ask questions and all were answered. The patient agreed with the plan and demonstrated an understanding of the instructions.   The patient was advised to call back or seek an in-person evaluation if the symptoms worsen or if the condition fails to improve as anticipated.  I provided 20 minutes of non-face-to-face time during this encounter.   Arvella CHRISTELLA Finder, MD      [1] No Known Allergies

## 2024-02-01 ENCOUNTER — Encounter (HOSPITAL_COMMUNITY): Payer: Self-pay | Admitting: Psychiatry

## 2024-02-01 ENCOUNTER — Telehealth (HOSPITAL_COMMUNITY): Admitting: Psychiatry

## 2024-02-01 DIAGNOSIS — F411 Generalized anxiety disorder: Secondary | ICD-10-CM

## 2024-02-01 DIAGNOSIS — Z79899 Other long term (current) drug therapy: Secondary | ICD-10-CM | POA: Diagnosis not present

## 2024-02-01 DIAGNOSIS — F331 Major depressive disorder, recurrent, moderate: Secondary | ICD-10-CM

## 2024-02-01 MED ORDER — LURASIDONE HCL 40 MG PO TABS: 40.0000 mg | ORAL_TABLET | Freq: Every day | ORAL | 0 refills | Status: AC

## 2024-02-01 MED ORDER — BUPROPION HCL ER (XL) 150 MG PO TB24: 450.0000 mg | ORAL_TABLET | ORAL | 2 refills | Status: AC

## 2024-02-01 MED ORDER — DULOXETINE HCL 60 MG PO CPEP: 60.0000 mg | ORAL_CAPSULE | Freq: Two times a day (BID) | ORAL | 0 refills | Status: AC

## 2024-02-02 ENCOUNTER — Telehealth (HOSPITAL_COMMUNITY): Payer: Self-pay

## 2024-02-02 NOTE — Telephone Encounter (Signed)
 Prior auth received for patients Jay [name] . Prior Jay [name] was approved until 01/11/2025

## 2024-02-10 NOTE — Telephone Encounter (Signed)
 Sent mychart to follow up on patient portion of xarelto

## 2024-02-14 NOTE — Progress Notes (Signed)
 Pt notified and order placed

## 2024-02-16 ENCOUNTER — Ambulatory Visit: Admitting: Physician Assistant

## 2024-02-17 ENCOUNTER — Other Ambulatory Visit (HOSPITAL_COMMUNITY): Payer: Self-pay

## 2024-02-17 ENCOUNTER — Ambulatory Visit: Admitting: Student

## 2024-02-17 ENCOUNTER — Other Ambulatory Visit: Payer: Self-pay | Admitting: Adult Health

## 2024-02-17 MED ORDER — APIXABAN 5 MG PO TABS: 5.0000 mg | ORAL_TABLET | Freq: Two times a day (BID) | ORAL | 0 refills | Status: AC

## 2024-02-17 NOTE — Telephone Encounter (Signed)
 Albuterol   The original prescription was discontinued on 12/01/2023 by Ward, Angelica, CPhT for the following reason: Patient Preference. Renewing this prescription may not be appropriate.

## 2024-02-17 NOTE — Telephone Encounter (Signed)
" ° ° ° °  Pharmacy Patient Advocate Encounter  Received notification from [employer] that Prior Authorization for eliquis  has been APPROVED from 02/17/24 to 01/11/25. Ran test claim, Copay is $249.00. This test claim was processed through [facility]- copay amounts may vary at other pharmacies due to pharmacy/plan contracts, or as the patient moves through the different stages of their insurance plan.   PA #/Case ID/Reference #: [id number]   He has a 250.00 deductible. He can't afford the 250.00 so filled out the j&j application    Faxed j&j application for xarelto 02/17/24   "

## 2024-02-17 NOTE — Telephone Encounter (Signed)
 Hi, the patient only has 6-7 days left of his eliquis  5mg . I just got his application to send in to [employer] for assistance for parker hannifin. Do you have any samples that can get him until he can get his xarelto from [employer]? Also, to confirm he is supposed to get xarelto 20mg  take 1 tablet a day? The application was signed but the medication part was not filled out. Thank you!

## 2024-02-17 NOTE — Telephone Encounter (Signed)
 Spoke to patient and verbalized that samples of Eliquis  5mg  were available for pick up in the [place] office. Pt voiced understanding and stated that he would pick them up on 2/6.  Samples located in cabinet by the registration desk.

## 2024-02-17 NOTE — Telephone Encounter (Signed)
 Faxed j&j application for xarelto 20mg 

## 2024-03-07 ENCOUNTER — Ambulatory Visit: Admitting: Physician Assistant

## 2024-03-28 ENCOUNTER — Telehealth (HOSPITAL_COMMUNITY): Admitting: Psychiatry

## 2024-06-29 ENCOUNTER — Encounter (HOSPITAL_BASED_OUTPATIENT_CLINIC_OR_DEPARTMENT_OTHER): Admitting: Pulmonary Disease
